# Patient Record
Sex: Male | Born: 1960
Health system: Southern US, Community
[De-identification: ages and names within clinical notes are randomized; demographics above are authoritative.]

## PROBLEM LIST (undated history)

## (undated) DIAGNOSIS — I10 Essential (primary) hypertension: Secondary | ICD-10-CM

## (undated) DIAGNOSIS — E559 Vitamin D deficiency, unspecified: Secondary | ICD-10-CM

## (undated) DIAGNOSIS — R7303 Prediabetes: Secondary | ICD-10-CM

## (undated) DIAGNOSIS — E785 Hyperlipidemia, unspecified: Secondary | ICD-10-CM

## (undated) HISTORY — DX: Vitamin D deficiency, unspecified: E55.9

## (undated) HISTORY — DX: Hyperlipidemia, unspecified: E78.5

## (undated) HISTORY — DX: Essential (primary) hypertension: I10

## (undated) HISTORY — DX: Prediabetes: R73.03

---

## 2002-11-09 HISTORY — PX: NASAL SEPTUM SURGERY: SHX37

## 2004-04-16 ENCOUNTER — Ambulatory Visit (HOSPITAL_COMMUNITY): Admission: RE | Admit: 2004-04-16 | Discharge: 2004-04-16 | Payer: Self-pay | Admitting: Internal Medicine

## 2008-07-20 ENCOUNTER — Ambulatory Visit (HOSPITAL_COMMUNITY): Admission: RE | Admit: 2008-07-20 | Discharge: 2008-07-20 | Payer: Self-pay | Admitting: Internal Medicine

## 2008-12-10 DIAGNOSIS — E119 Type 2 diabetes mellitus without complications: Secondary | ICD-10-CM

## 2008-12-10 HISTORY — DX: Type 2 diabetes mellitus without complications: E11.9

## 2009-09-11 IMAGING — CR DG CHEST 2V
2 series · 2 of 2 positions shown · non-contrast
Comparison: 04/16/2004

CLINICAL DATA: Hypertension.  Fatigue.

CHEST - 2 VIEW

[view not recorded (1 of 2)]
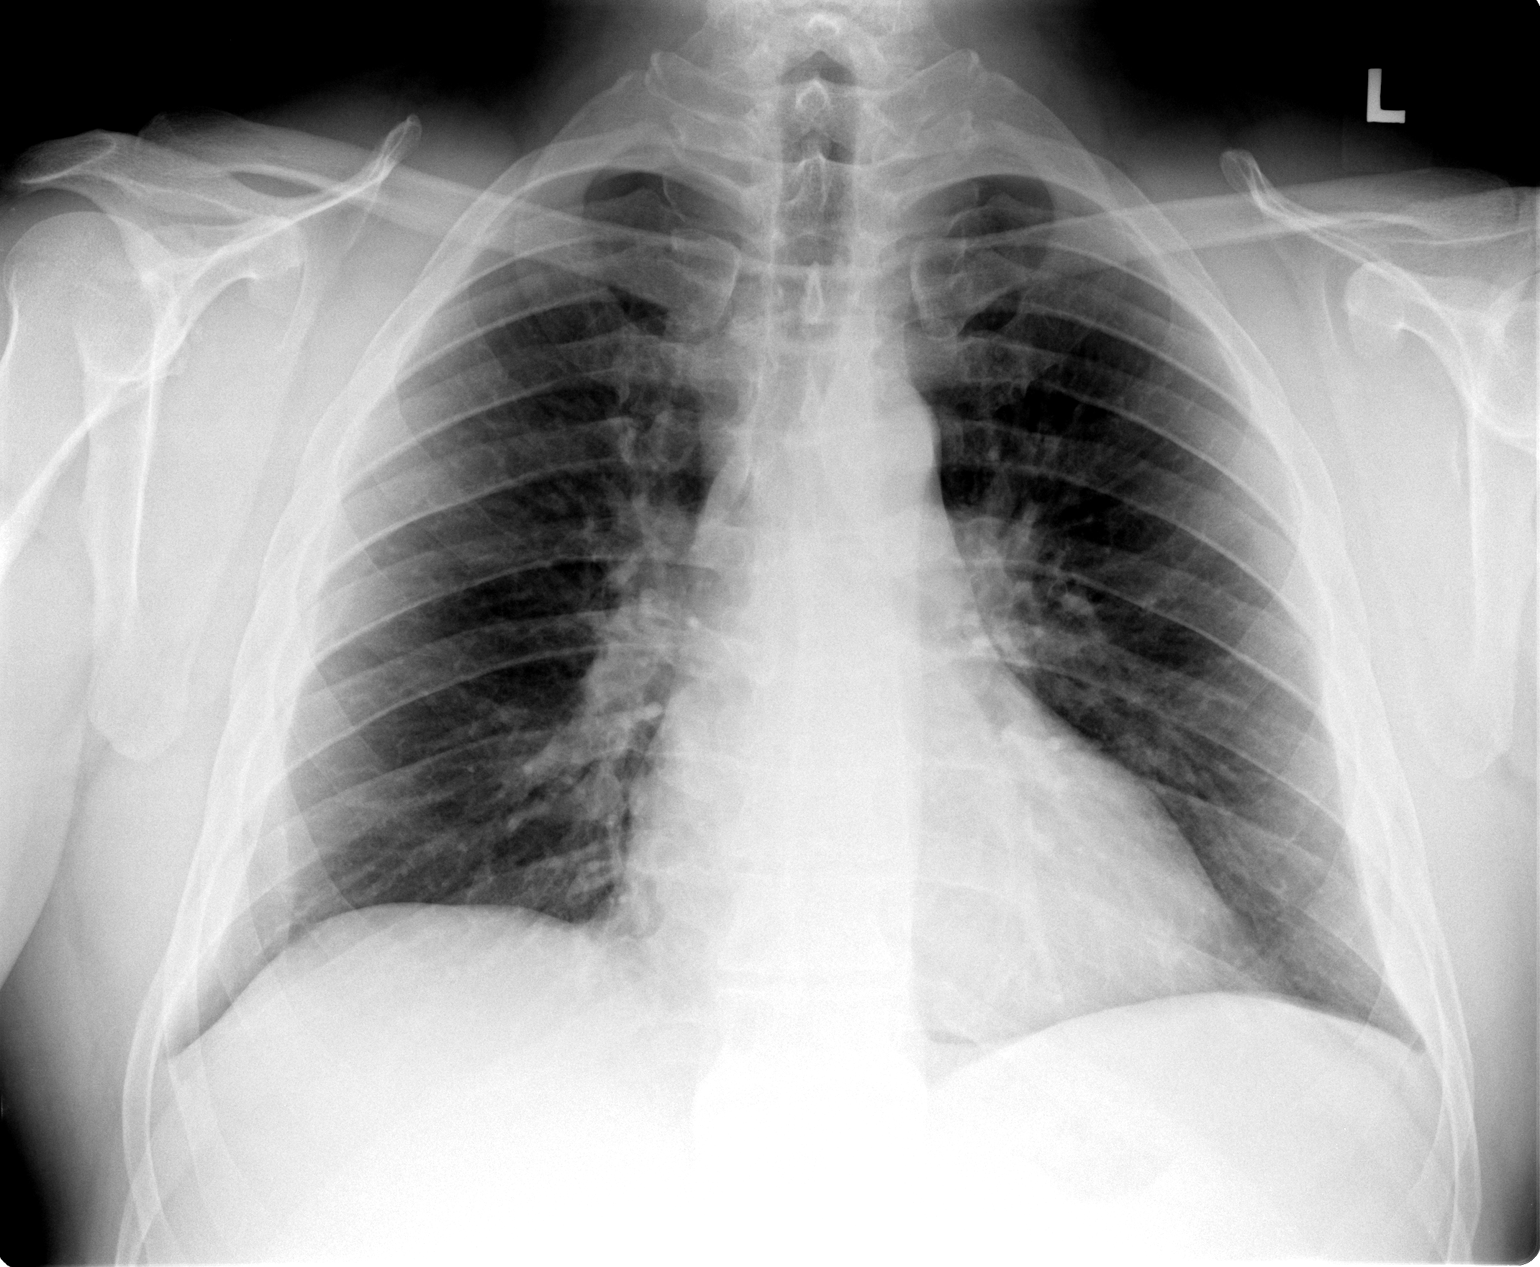

[view not recorded (2 of 2)]
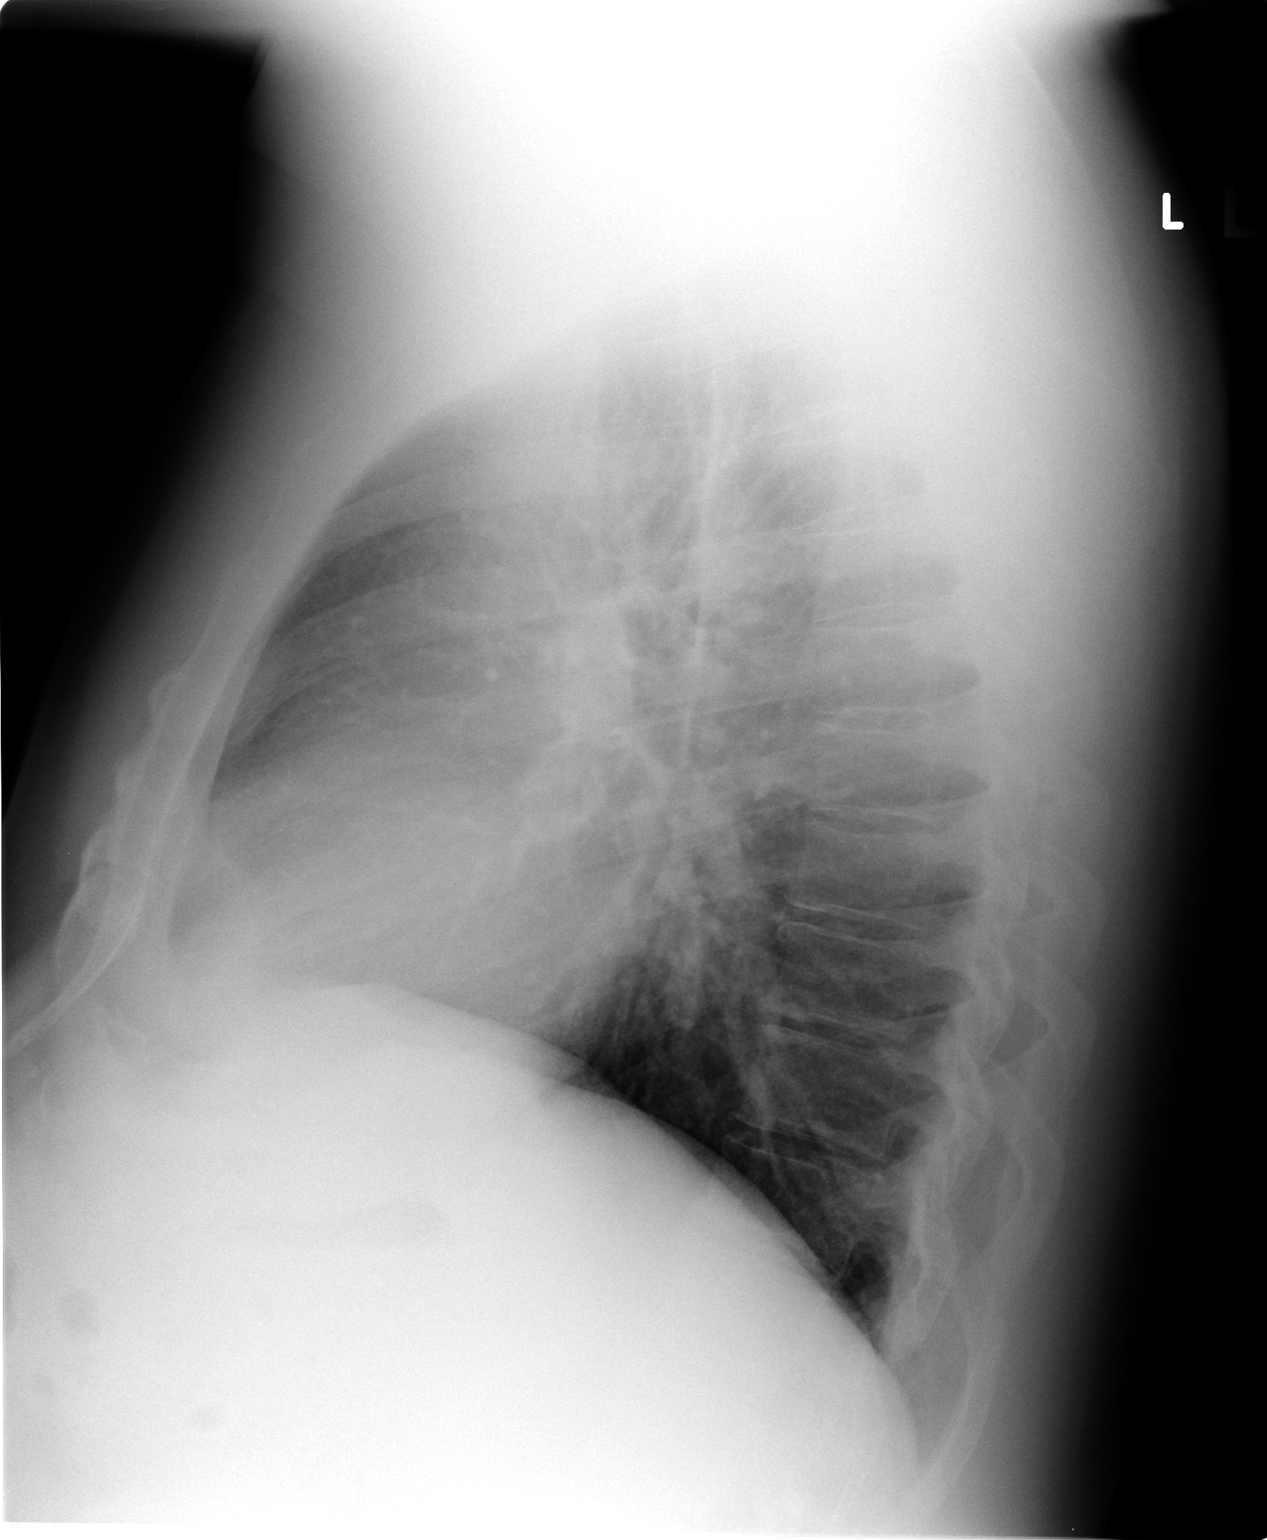

[2 of 2 positions shown; findings below may reference images not displayed]

FINDINGS: Heart and mediastinal contours are within normal limits.
The lung fields appear clear with no evidence for focal infiltrate
or congestive failure.  A slight increase in bilateral pleural
thickening along the lateral thoracic and substernal walls is noted
since the previous exam and likely represents an increase in extra
pleural fat.  No pleural effusions are noted and bony structures
appear intact.
IMPRESSION: No acute cardiopulmonary disease.

## 2011-11-10 HISTORY — PX: HEMORRHOID BANDING: SHX5850

## 2012-03-21 ENCOUNTER — Ambulatory Visit
Admission: RE | Admit: 2012-03-21 | Discharge: 2012-03-21 | Disposition: A | Discharge status: Home / Self Care | Payer: 59 | Source: Ambulatory Visit | Attending: Internal Medicine | Admitting: Internal Medicine

## 2012-03-21 ENCOUNTER — Other Ambulatory Visit: Payer: Self-pay | Admitting: Internal Medicine

## 2012-03-21 DIAGNOSIS — R609 Edema, unspecified: Secondary | ICD-10-CM

## 2012-03-21 DIAGNOSIS — R52 Pain, unspecified: Secondary | ICD-10-CM

## 2012-03-21 DIAGNOSIS — I82402 Acute embolism and thrombosis of unspecified deep veins of left lower extremity: Secondary | ICD-10-CM

## 2013-05-13 IMAGING — US US EXTREM LOW VENOUS*L*
1 series · 14 of 24 positions shown · non-contrast
Comparison: None.

CLINICAL DATA: Left lower extremity pain and swelling, evaluate for
DVT

LEFT LOWER EXTREMITY VENOUS DUPLEX ULTRASOUND
TECHNIQUE: Gray-scale sonography with graded compression, as well
as color Doppler and duplex ultrasound were performed to evaluate
the deep venous system of the lower extremity from the level of the
common femoral vein through the popliteal and proximal calf veins.
Spectral Doppler was utilized to evaluate flow at rest and with
distal augmentation maneuvers.

[Series 1: us extrem low venous*left* · 14 of 28 slices shown]
[im 1/28]
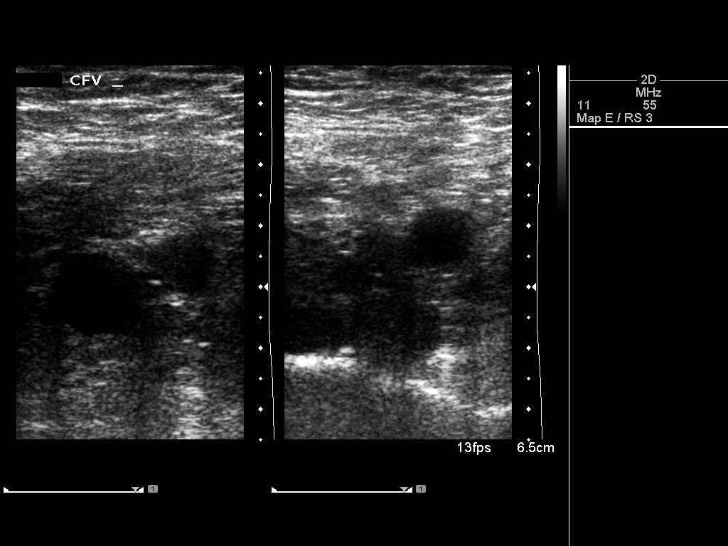
[im 3/28]
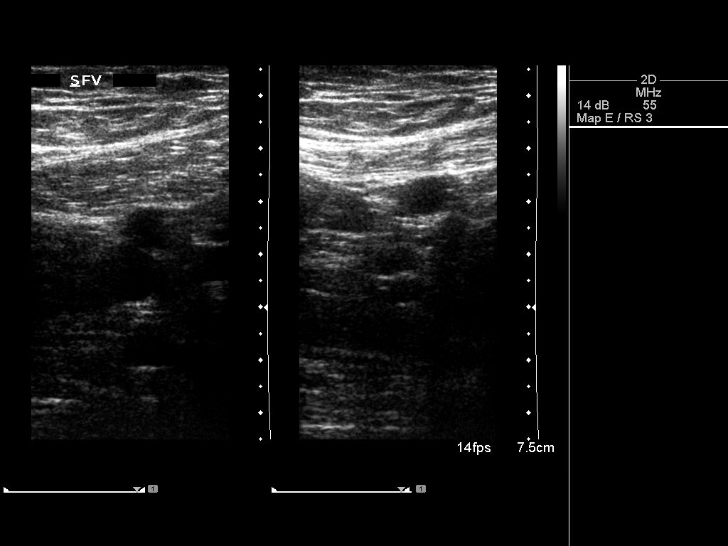
[im 5/28]
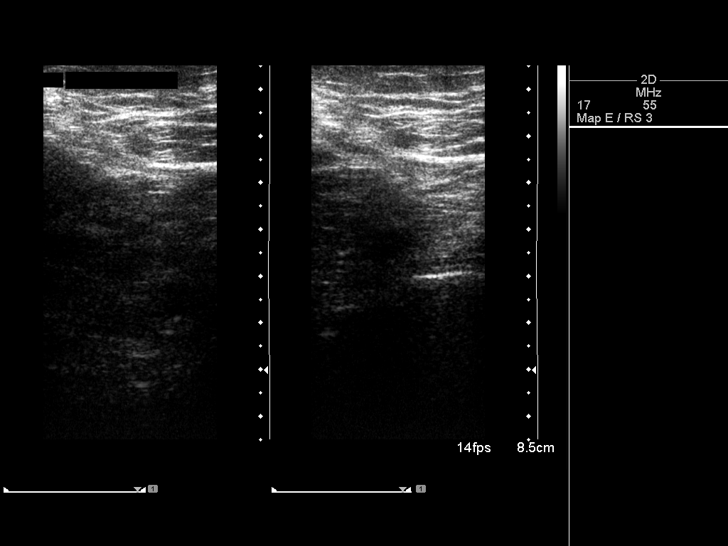
[im 8/28]
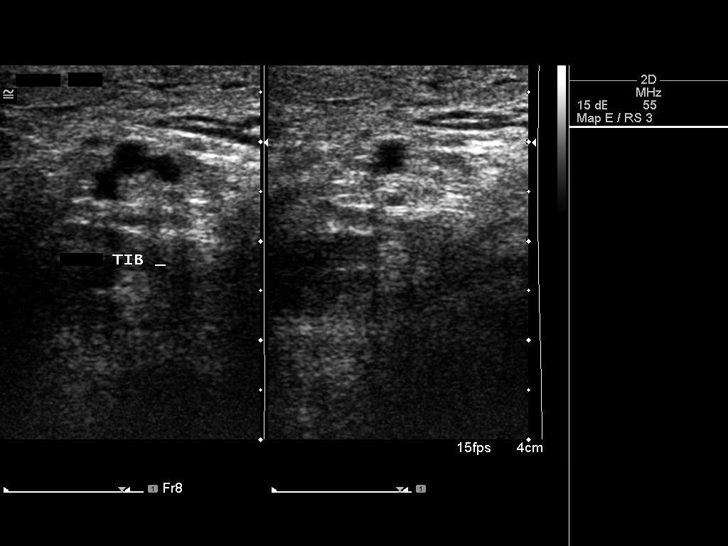
[im 9/28]
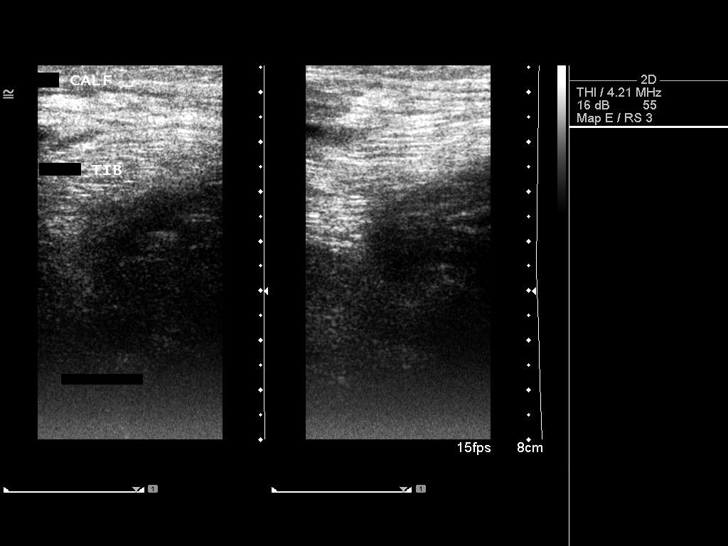
[im 11/28]
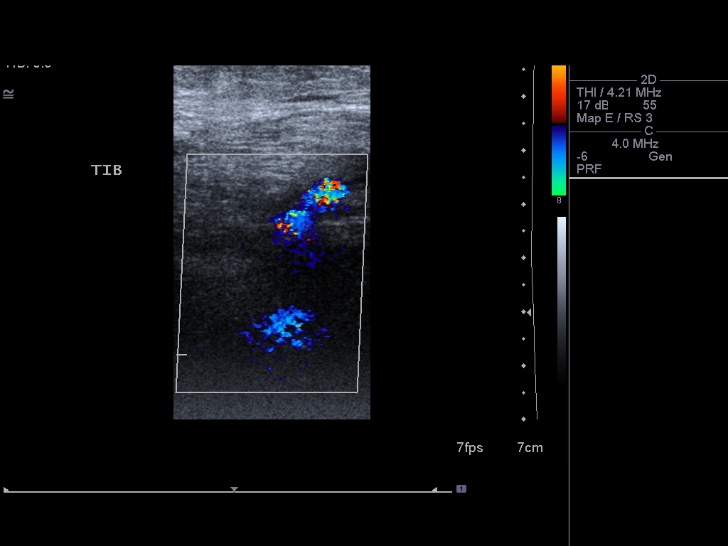
[im 13/28]
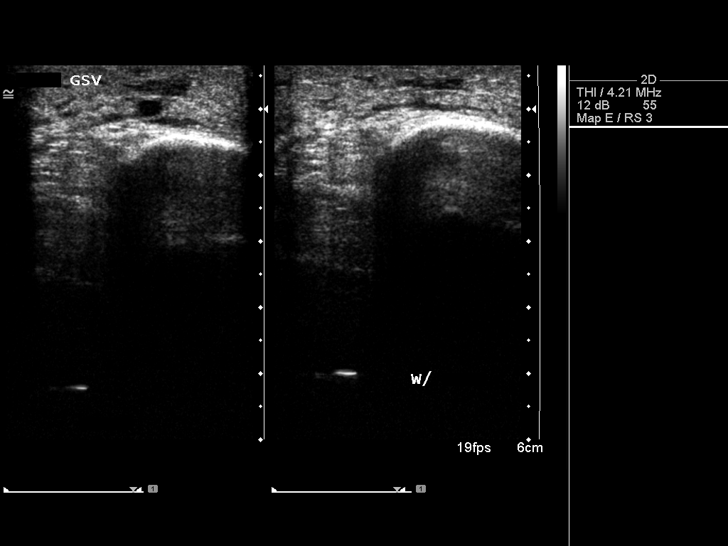
[im 15/28]
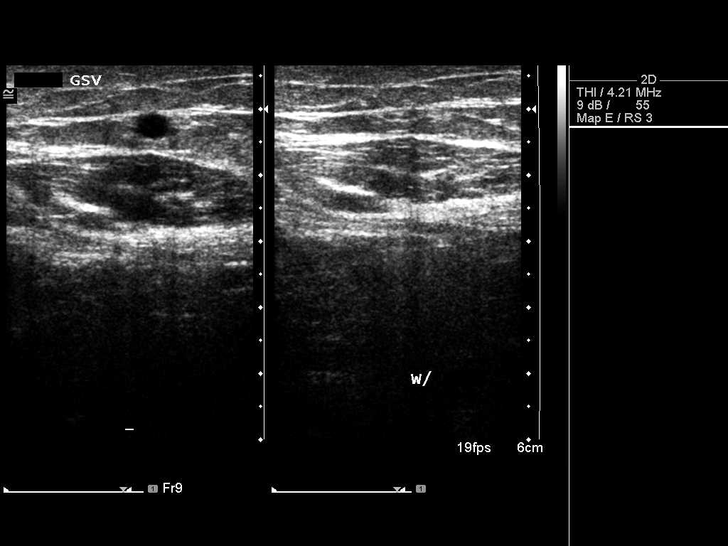
[im 17/28]
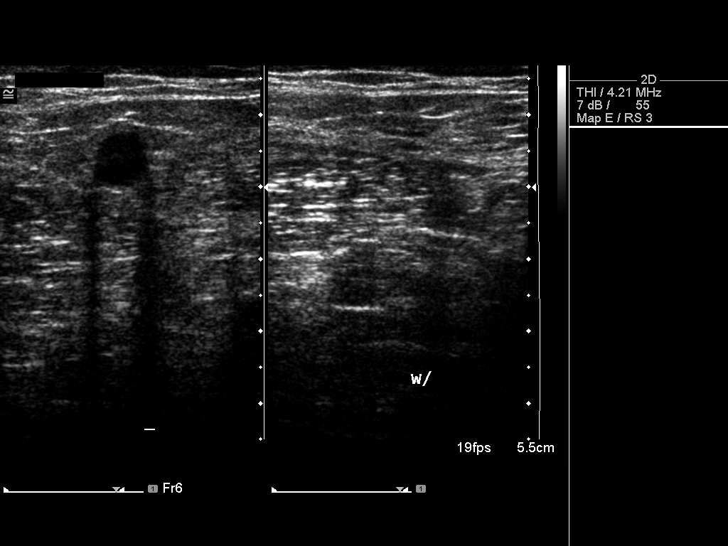
[im 19/28]
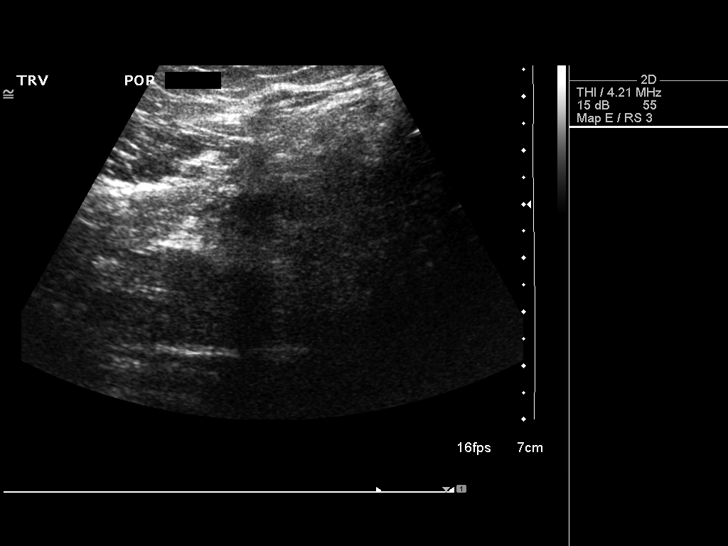
[im 22/28]
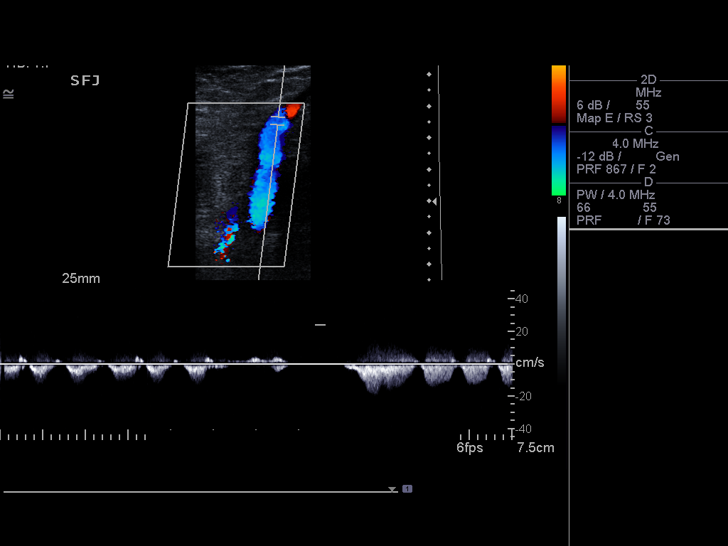
[im 23/28]
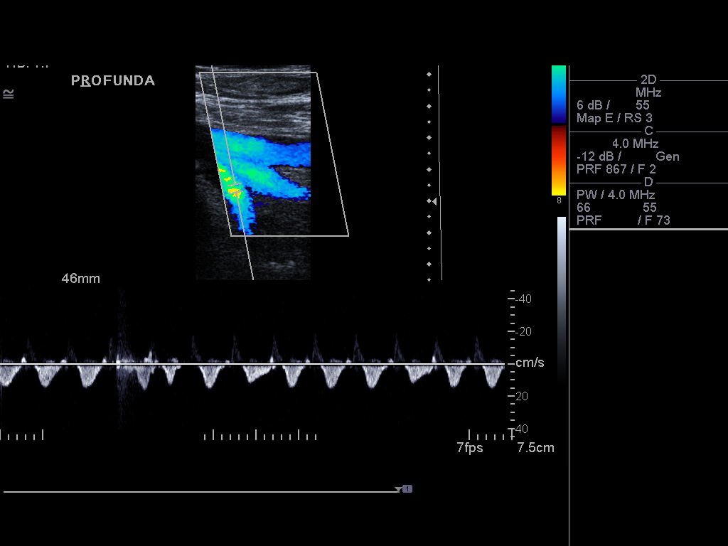
[im 25/28]
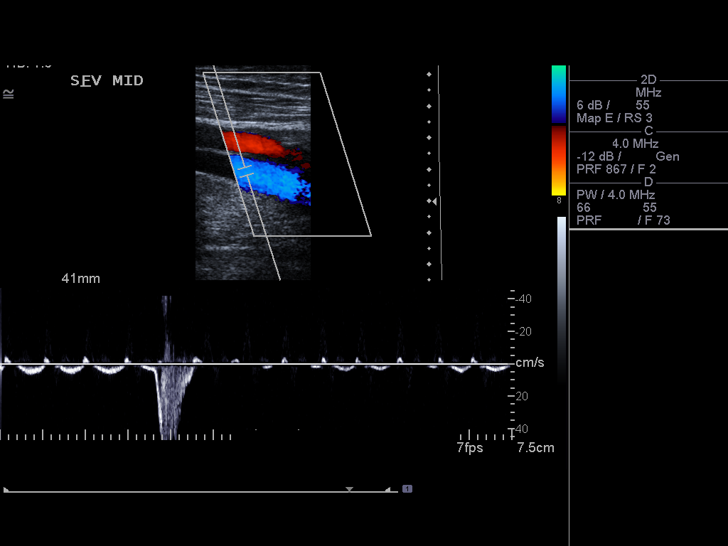
[im 28/28]
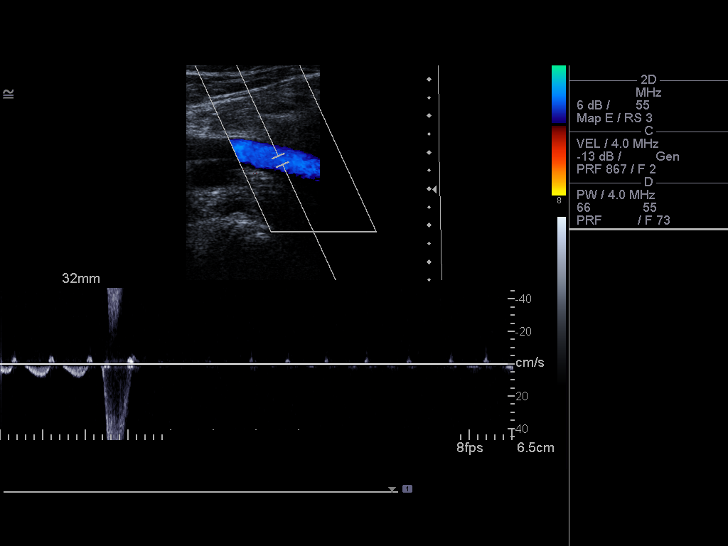

[14 of 24 positions shown; findings below may reference images not displayed]

FINDINGS: Normal compressibility of the common femoral,
superficial femoral, and popliteal veins is demonstrated, as well
as the visualized proximal calf veins.  No filling defects to
suggest DVT on grayscale or color Doppler imaging.  Doppler
waveforms show normal direction of venous flow, normal respiratory
phasicity and response to augmentation.

Minimal amount of subcutaneous edema noted within the lower leg.
IMPRESSION: No evidence of deep vein thrombosis within the left lower
extremity.

## 2014-01-02 ENCOUNTER — Encounter: Payer: Self-pay | Admitting: Physician Assistant

## 2014-04-11 ENCOUNTER — Encounter: Payer: Self-pay | Admitting: Physician Assistant

## 2014-06-26 ENCOUNTER — Ambulatory Visit (INDEPENDENT_AMBULATORY_CARE_PROVIDER_SITE_OTHER): Payer: 59 | Admitting: Internal Medicine

## 2014-06-26 ENCOUNTER — Encounter: Payer: Self-pay | Admitting: Internal Medicine

## 2014-06-26 VITALS — BP 152/90 | HR 64 | Temp 97.9°F | Resp 16 | Ht 70.0 in | Wt 266.8 lb

## 2014-06-26 DIAGNOSIS — E782 Mixed hyperlipidemia: Secondary | ICD-10-CM | POA: Insufficient documentation

## 2014-06-26 DIAGNOSIS — R7309 Other abnormal glucose: Secondary | ICD-10-CM

## 2014-06-26 DIAGNOSIS — Z79899 Other long term (current) drug therapy: Secondary | ICD-10-CM

## 2014-06-26 DIAGNOSIS — E559 Vitamin D deficiency, unspecified: Secondary | ICD-10-CM

## 2014-06-26 DIAGNOSIS — I10 Essential (primary) hypertension: Secondary | ICD-10-CM

## 2014-06-26 DIAGNOSIS — R7303 Prediabetes: Secondary | ICD-10-CM

## 2014-06-26 LAB — CBC WITH DIFFERENTIAL/PLATELET
Basophils Absolute: 0 10*3/uL (ref 0.0–0.1)
Basophils Relative: 0 % (ref 0–1)
Eosinophils Absolute: 0.1 10*3/uL (ref 0.0–0.7)
Eosinophils Relative: 2 % (ref 0–5)
HEMATOCRIT: 43.7 % (ref 39.0–52.0)
Hemoglobin: 15.2 g/dL (ref 13.0–17.0)
LYMPHS ABS: 2.3 10*3/uL (ref 0.7–4.0)
Lymphocytes Relative: 36 % (ref 12–46)
MCH: 28.1 pg (ref 26.0–34.0)
MCHC: 34.8 g/dL (ref 30.0–36.0)
MCV: 80.9 fL (ref 78.0–100.0)
Monocytes Absolute: 0.6 10*3/uL (ref 0.1–1.0)
Monocytes Relative: 10 % (ref 3–12)
NEUTROS ABS: 3.3 10*3/uL (ref 1.7–7.7)
Neutrophils Relative %: 52 % (ref 43–77)
Platelets: 235 10*3/uL (ref 150–400)
RBC: 5.4 MIL/uL (ref 4.22–5.81)
RDW: 14.1 % (ref 11.5–15.5)
WBC: 6.3 10*3/uL (ref 4.0–10.5)

## 2014-06-26 LAB — HEMOGLOBIN A1C
HEMOGLOBIN A1C: 6 % — AB (ref <5.7)
MEAN PLASMA GLUCOSE: 126 mg/dL — AB (ref <117)

## 2014-06-26 MED ORDER — BISOPROLOL-HYDROCHLOROTHIAZIDE 5-6.25 MG PO TABS: 1.0000 | ORAL_TABLET | Freq: Every day | ORAL | Status: DC

## 2014-06-26 MED ORDER — ATORVASTATIN CALCIUM 80 MG PO TABS: 80.0000 mg | ORAL_TABLET | Freq: Every day | ORAL | Status: DC

## 2014-06-26 NOTE — Patient Instructions (Signed)
Start Atorvastatin 80 mg at 1/2 tablet = 40 mg daily  Hypertension As your heart beats, it forces blood through your arteries. This force is your blood pressure. If the pressure is too high, it is called hypertension (HTN) or high blood pressure. HTN is dangerous because you may have it and not know it. High blood pressure may mean that your heart has to work harder to pump blood. Your arteries may be narrow or stiff. The extra work puts you at risk for heart disease, stroke, and other problems.  Blood pressure consists of two numbers, a higher number over a lower, 110/72, for example. It is stated as "110 over 72." The ideal is below 120 for the top number (systolic) and under 80 for the bottom (diastolic). Write down your blood pressure today. You should pay close attention to your blood pressure if you have certain conditions such as:  Heart failure.  Prior heart attack.  Diabetes  Chronic kidney disease.  Prior stroke.  Multiple risk factors for heart disease. To see if you have HTN, your blood pressure should be measured while you are seated with your arm held at the level of the heart. It should be measured at least twice. A one-time elevated blood pressure reading (especially in the Emergency Department) does not mean that you need treatment. There may be conditions in which the blood pressure is different between your right and left arms. It is important to see your caregiver soon for a recheck. Most people have essential hypertension which means that there is not a specific cause. This type of high blood pressure may be lowered by changing lifestyle factors such as:  Stress.  Smoking.  Lack of exercise.  Excessive weight.  Drug/tobacco/alcohol use.  Eating less salt. Most people do not have symptoms from high blood pressure until it has caused damage to the body. Effective treatment can often prevent, delay or reduce that damage. TREATMENT  When a cause has been identified,  treatment for high blood pressure is directed at the cause. There are a large number of medications to treat HTN. These fall into several categories, and your caregiver will help you select the medicines that are best for you. Medications may have side effects. You should review side effects with your caregiver. If your blood pressure stays high after you have made lifestyle changes or started on medicines,   Your medication(s) may need to be changed.  Other problems may need to be addressed.  Be certain you understand your prescriptions, and know how and when to take your medicine.  Be sure to follow up with your caregiver within the time frame advised (usually within two weeks) to have your blood pressure rechecked and to review your medications.  If you are taking more than one medicine to lower your blood pressure, make sure you know how and at what times they should be taken. Taking two medicines at the same time can result in blood pressure that is too low. SEEK IMMEDIATE MEDICAL CARE IF:  You develop a severe headache, blurred or changing vision, or confusion.  You have unusual weakness or numbness, or a faint feeling.  You have severe chest or abdominal pain, vomiting, or breathing problems. MAKE SURE YOU:   Understand these instructions.  Will watch your condition.  Will get help right away if you are not doing well or get worse.   Diabetes and Exercise Exercising regularly is important. It is not just about losing weight. It has many  health benefits, such as:  Improving your overall fitness, flexibility, and endurance.  Increasing your bone density.  Helping with weight control.  Decreasing your body fat.  Increasing your muscle strength.  Reducing stress and tension.  Improving your overall health. People with diabetes who exercise gain additional benefits because exercise:  Reduces appetite.  Improves the body's use of blood sugar (glucose).  Helps lower  or control blood glucose.  Decreases blood pressure.  Helps control blood lipids (such as cholesterol and triglycerides).  Improves the body's use of the hormone insulin by:  Increasing the body's insulin sensitivity.  Reducing the body's insulin needs.  Decreases the risk for heart disease because exercising:  Lowers cholesterol and triglycerides levels.  Increases the levels of good cholesterol (such as high-density lipoproteins [HDL]) in the body.  Lowers blood glucose levels. YOUR ACTIVITY PLAN  Choose an activity that you enjoy and set realistic goals. Your health care provider or diabetes educator can help you make an activity plan that works for you. You can break activities into 2 or 3 sessions throughout the day. Doing so is as good as one long session. Exercise ideas include:  Taking the dog for a walk.  Taking the stairs instead of the elevator.  Dancing to your favorite song.  Doing your favorite exercise with a friend. RECOMMENDATIONS FOR EXERCISING WITH TYPE 1 OR TYPE 2 DIABETES   Check your blood glucose before exercising. If blood glucose levels are greater than 240 mg/dL, check for urine ketones. Do not exercise if ketones are present.  Avoid injecting insulin into areas of the body that are going to be exercised. For example, avoid injecting insulin into:  The arms when playing tennis.  The legs when jogging.  Keep a record of:  Food intake before and after you exercise.  Expected peak times of insulin action.  Blood glucose levels before and after you exercise.  The type and amount of exercise you have done.  Review your records with your health care provider. Your health care provider will help you to develop guidelines for adjusting food intake and insulin amounts before and after exercising.  If you take insulin or oral hypoglycemic agents, watch for signs and symptoms of hypoglycemia. They  include:  Dizziness.  Shaking.  Sweating.  Chills.  Confusion.  Drink plenty of water while you exercise to prevent dehydration or heat stroke. Body water is lost during exercise and must be replaced.  Talk to your health care provider before starting an exercise program to make sure it is safe for you. Remember, almost any type of activity is better than none.    Cholesterol Cholesterol is a white, waxy, fat-like protein needed by your body in small amounts. The liver makes all the cholesterol you need. It is carried from the liver by the blood through the blood vessels. Deposits (plaque) may build up on blood vessel walls. This makes the arteries narrower and stiffer. Plaque increases the risk for heart attack and stroke. You cannot feel your cholesterol level even if it is very high. The only way to know is by a blood test to check your lipid (fats) levels. Once you know your cholesterol levels, you should keep a record of the test results. Work with your caregiver to to keep your levels in the desired range. WHAT THE RESULTS MEAN:  Total cholesterol is a rough measure of all the cholesterol in your blood.  LDL is the so-called bad cholesterol. This is the type that  deposits cholesterol in the walls of the arteries. You want this level to be low.  HDL is the good cholesterol because it cleans the arteries and carries the LDL away. You want this level to be high.  Triglycerides are fat that the body can either burn for energy or store. High levels are closely linked to heart disease. DESIRED LEVELS:  Total cholesterol below 200.  LDL below 100 for people at risk, below 70 for very high risk.  HDL above 50 is good, above 60 is best.  Triglycerides below 150. HOW TO LOWER YOUR CHOLESTEROL:  Diet.  Choose fish or white meat chicken and [place], roasted or baked. Limit fatty cuts of red meat, fried foods, and processed meats, such as sausage and lunch meat.  Eat lots of  fresh fruits and vegetables. Choose whole grains, beans, pasta, potatoes and cereals.  Use only small amounts of olive, corn or canola oils. Avoid butter, mayonnaise, shortening or palm kernel oils. Avoid foods with trans-fats.  Use skim/nonfat milk and low-fat/nonfat yogurt and cheeses. Avoid whole milk, cream, ice cream, egg yolks and cheeses. Healthy desserts include angel food cake, ginger snaps, animal crackers, hard candy, popsicles, and low-fat/nonfat frozen yogurt. Avoid pastries, cakes, pies and cookies.  Exercise.  A regular program helps decrease LDL and raises HDL.  Helps with weight control.  Do things that increase your activity level like gardening, walking, or taking the stairs.  Medication.  May be prescribed by your caregiver to help lowering cholesterol and the risk for heart disease.  You may need medicine even if your levels are normal if you have several risk factors. HOME CARE INSTRUCTIONS   Follow your diet and exercise programs as suggested by your caregiver.  Take medications as directed.  Have blood work done when your caregiver feels it is necessary. MAKE SURE YOU:   Understand these instructions.  Will watch your condition.  Will get help right away if you are not doing well or get worse.      Vitamin D Deficiency Vitamin D is an important vitamin that your body needs. Having too little of it in your body is called a deficiency. A very bad deficiency can make your bones soft and can cause a condition called rickets.  Vitamin D is important to your body for different reasons, such as:   It helps your body absorb 2 minerals called calcium and phosphorus.  It helps make your bones healthy.  It may prevent some diseases, such as diabetes and multiple sclerosis.  It helps your muscles and heart. You can get vitamin D in several ways. It is a natural part of some foods. The vitamin is also added to some dairy products and cereals. Some people  take vitamin D supplements. Also, your body makes vitamin D when you are in the sun. It changes the sun's rays into a form of the vitamin that your body can use. CAUSES   Not eating enough foods that contain vitamin D.  Not getting enough sunlight.  Having certain digestive system diseases that make it hard to absorb vitamin D. These diseases include Crohn's disease, chronic pancreatitis, and cystic fibrosis.  Having a surgery in which part of the stomach or small intestine is removed.  Being obese. Fat cells pull vitamin D out of your blood. That means that obese people may not have enough vitamin D left in their blood and in other body tissues.  Having chronic kidney or liver disease. RISK FACTORS Risk factors  are things that make you more likely to develop a vitamin D deficiency. They include:  Being older.  Not being able to get outside very much.  Living in a nursing home.  Having had broken bones.  Having weak or thin bones (osteoporosis).  Having a disease or condition that changes how your body absorbs vitamin D.  Having dark skin.  Some medicines such as seizure medicines or steroids.  Being overweight or obese. SYMPTOMS Mild cases of vitamin D deficiency may not have any symptoms. If you have a very bad case, symptoms may include:  Bone pain.  Muscle pain.  Falling often.  Broken bones caused by a minor injury, due to osteoporosis. DIAGNOSIS A blood test is the best way to tell if you have a vitamin D deficiency. TREATMENT Vitamin D deficiency can be treated in different ways. Treatment for vitamin D deficiency depends on what is causing it. Options include:  Taking vitamin D supplements.  Taking a calcium supplement. Your caregiver will suggest what dose is best for you. HOME CARE INSTRUCTIONS  Take any supplements that your caregiver prescribes. Follow the directions carefully. Take only the suggested amount.  Have your blood tested 2 months after  you start taking supplements.  Eat foods that contain vitamin D. Healthy choices include:  Fortified dairy products, cereals, or juices. Fortified means vitamin D has been added to the food. Check the label on the package to be sure.  Fatty fish like salmon or trout.  Eggs.  Oysters.  Do not use a tanning bed.  Keep your weight at a healthy level. Lose weight if you need to.  Keep all follow-up appointments. Your caregiver will need to perform blood tests to make sure your vitamin D deficiency is going away. SEEK MEDICAL CARE IF:  You have any questions about your treatment.  You continue to have symptoms of vitamin D deficiency.  You have nausea or vomiting.  You are constipated.  You feel confused.  You have severe abdominal or back pain. MAKE SURE YOU:  Understand these instructions.  Will watch your condition.  Will get help right away if you are not doing well or get worse.

## 2014-06-26 NOTE — Progress Notes (Signed)
Patient ID: Larry Hudson, male   DOB: 16-Feb-1961, 53 y.o.   MRN: [id number]   This very nice 53 y.o.M [place] Male whp  presents for follow up with Hypertension, Hyperlipidemia, Pre-Diabetes and Vitamin D Deficiency.    Patient has been off Treatment and lost to f/u after he lost his job last year. Today's BP: 152/90 mmHg. Patient denies any cardiac type chest pain, palpitations, dyspnea/orthopnea/PND, dizziness, claudication, or dependent edema.   Hyperlipidemia is not controlled with diet with last labs in Feb 2014 showing Elevated lipids with Chol 309, Trig 175, HDL 31 And severely elevated LDL of 243.   Also, the patient has history of PreDiabetes and and insulin resistance with an A1c of 6.1% and insulin level 39 in Feb 2014. Patient denies any symptoms of reactive hypoglycemia, diabetic polys, paresthesias or visual blurring.  Last A1c was     Further, Patient has history of Vitamin D Deficiency and patient supplements vitamin D without any suspected side-effects. Last vitamin D was  63 in Feb 2014.  Current meds/supplements include BComplex, bASA 81 mg, Fish Oil 1200 mg, and Vit D 5,000 u/day.  No Known Allergies  PMHx:  Pertinent as above for Labile HTN, Hyperlipidemia, PreDiabetes and Vit D Deficiency. FHx:    Reviewed / unchanged SHx:    Reviewed / unchanged  Systems Review:  Constitutional: Denies fever, chills, wt changes, headaches, insomnia, fatigue, night sweats, change in appetite. Eyes: Denies redness, blurred vision, diplopia, discharge, itchy, watery eyes.  ENT: Denies discharge, congestion, post nasal drip, epistaxis, sore throat, earache, hearing loss, dental pain, tinnitus, vertigo, sinus pain, snoring.  CV: Denies chest pain, palpitations, irregular heartbeat, syncope, dyspnea, diaphoresis, orthopnea, PND, claudication or edema. Respiratory: denies cough, dyspnea, DOE, pleurisy, hoarseness, laryngitis, wheezing.  Gastrointestinal: Denies dysphagia, odynophagia,  heartburn, reflux, water brash, abdominal pain or cramps, nausea, vomiting, bloating, diarrhea, constipation, hematemesis, melena, hematochezia  or hemorrhoids. Genitourinary: Denies dysuria, frequency, urgency, nocturia, hesitancy, discharge, hematuria or flank pain. Musculoskeletal: Denies arthralgias, myalgias, stiffness, jt. swelling, pain, limping or strain/sprain.  Skin: Denies pruritus, rash, hives, warts, acne, eczema or change in skin lesion(s). Neuro: No weakness, tremor, incoordination, spasms, paresthesia or pain. Psychiatric: Denies confusion, memory loss or sensory loss. Endo: Denies change in weight, skin or hair change.  Heme/Lymph: No excessive bleeding, bruising or enlarged lymph nodes.  Exam:  BP 152/90  Pulse 64  Temp(Src) 97.9 F (36.6 C)  Resp 16  Wt 266 lb 12.8 oz (121.02 kg)  Appears well nourished and in no distress. Eyes: PERRLA, EOMs, conjunctiva no swelling or erythema. Sinuses: No frontal/maxillary tenderness ENT/Mouth: EAC's clear, TM's nl w/o erythema, bulging. Nares clear w/o erythema, swelling, exudates. Oropharynx clear without erythema or exudates. Oral hygiene is good. Tongue normal, non obstructing. Hearing intact.  Neck: Supple. Thyroid nl. Car 2+/2+ without bruits, nodes or JVD. Chest: Respirations nl with BS clear & equal w/o rales, rhonchi, wheezing or stridor.  Cor: Heart sounds normal w/ regular rate and rhythm without sig. murmurs, gallops, clicks, or rubs. Peripheral pulses normal and equal  without edema.  Abdomen: Soft & bowel sounds normal. Non-tender w/o guarding, rebound, hernias, masses, or organomegaly.  Lymphatics: Unremarkable.  Musculoskeletal: Full ROM all peripheral extremities, joint stability, 5/5 strength, and normal gait.  Skin: Warm, dry without exposed rashes, lesions or ecchymosis apparent.  Neuro: Cranial nerves intact, reflexes equal bilaterally. Sensory-motor testing grossly intact. Tendon reflexes grossly intact.   Pysch: Alert & oriented x 3. Insight and judgement nl & appropriate. No  ideations.  Assessment and Plan:  1. Hypertension - Continue monitor blood pressure at home. Restart Ziac 5 qd.  2. Hyperlipidemia - Continue diet, exercise,& lifestyle modifications and Rx Atorvastatin 80 mg & begin 1/2 = 40 mg qd. Continue monitor periodic cholesterol/liver & renal functions   3. Pre-Diabetes - Continue diet, exercise, lifestyle modifications. Monitor appropriate labs.    4. Vitamin D Deficiency - Continue supplementation.   Recommended regular exercise, BP monitoring, weight control, and discussed med and SE's. Recommended labs to assess and monitor clinical status. Further disposition pending results of labs.  ROV 1 mo . Discussed meds & SE's.

## 2014-06-27 LAB — LIPID PANEL
CHOLESTEROL: 272 mg/dL — AB (ref 0–200)
HDL: 27 mg/dL — ABNORMAL LOW (ref >39)
LDL CALC: 166 mg/dL — AB (ref 0–99)
Total CHOL/HDL Ratio: 10.1 Ratio
Triglycerides: 393 mg/dL — ABNORMAL HIGH (ref <150)
VLDL: 79 mg/dL — ABNORMAL HIGH (ref 0–40)

## 2014-06-27 LAB — BASIC METABOLIC PANEL WITH GFR
BUN: 10 mg/dL (ref 6–23)
CALCIUM: 9.5 mg/dL (ref 8.4–10.5)
CHLORIDE: 101 meq/L (ref 96–112)
CO2: 24 mEq/L (ref 19–32)
CREATININE: 0.98 mg/dL (ref 0.50–1.35)
GFR, Est Non African American: 88 mL/min
GLUCOSE: 81 mg/dL (ref 70–99)
POTASSIUM: 4.5 meq/L (ref 3.5–5.3)
SODIUM: 136 meq/L (ref 135–145)

## 2014-06-27 LAB — TSH: TSH: 1.973 u[IU]/mL (ref 0.350–4.500)

## 2014-06-27 LAB — HEPATIC FUNCTION PANEL
ALBUMIN: 4.4 g/dL (ref 3.5–5.2)
ALT: 25 U/L (ref 0–53)
AST: 22 U/L (ref 0–37)
Alkaline Phosphatase: 54 U/L (ref 39–117)
BILIRUBIN INDIRECT: 1 mg/dL (ref 0.2–1.2)
Bilirubin, Direct: 0.1 mg/dL (ref 0.0–0.3)
Total Bilirubin: 1.1 mg/dL (ref 0.2–1.2)
Total Protein: 7.3 g/dL (ref 6.0–8.3)

## 2014-06-27 LAB — INSULIN, FASTING: INSULIN FASTING, SERUM: 20 u[IU]/mL (ref 3–28)

## 2014-06-27 LAB — MAGNESIUM: MAGNESIUM: 1.8 mg/dL (ref 1.5–2.5)

## 2014-06-27 LAB — VITAMIN D 25 HYDROXY (VIT D DEFICIENCY, FRACTURES): Vit D, 25-Hydroxy: 78 ng/mL (ref 30–89)

## 2014-08-10 ENCOUNTER — Ambulatory Visit (INDEPENDENT_AMBULATORY_CARE_PROVIDER_SITE_OTHER): Payer: 59 | Admitting: Internal Medicine

## 2014-08-10 ENCOUNTER — Encounter: Payer: Self-pay | Admitting: Internal Medicine

## 2014-08-10 VITALS — BP 140/86 | HR 52 | Temp 98.1°F | Resp 16 | Ht 70.0 in | Wt 269.6 lb

## 2014-08-10 DIAGNOSIS — R7303 Prediabetes: Secondary | ICD-10-CM

## 2014-08-10 DIAGNOSIS — R7309 Other abnormal glucose: Secondary | ICD-10-CM

## 2014-08-10 DIAGNOSIS — Z23 Encounter for immunization: Secondary | ICD-10-CM

## 2014-08-10 DIAGNOSIS — E782 Mixed hyperlipidemia: Secondary | ICD-10-CM

## 2014-08-10 DIAGNOSIS — E559 Vitamin D deficiency, unspecified: Secondary | ICD-10-CM

## 2014-08-10 DIAGNOSIS — I10 Essential (primary) hypertension: Secondary | ICD-10-CM

## 2014-08-10 DIAGNOSIS — Z79899 Other long term (current) drug therapy: Secondary | ICD-10-CM

## 2014-08-10 NOTE — Patient Instructions (Signed)

## 2014-08-10 NOTE — Progress Notes (Signed)
Patient ID: Larry Hudson, male   DOB: 10/23/1961, 53 y.o.   MRN: [id number]   This very nice 53 y.o.MWM presents for 3 month follow up with Hypertension, Hyperlipidemia, Pre-Diabetes and Vitamin D Deficiency.    Patient is treated for HTN & BP has been controlled at home. Today's BP: 140/86 mmHg. Patient has had no complaints of any cardiac type chest pain, palpitations, dyspnea/orthopnea/PND, dizziness, claudication, or dependent edema.   Hyperlipidemia is not controlled with diet & patient was recently started on Atorvastatin.  Patient denies myalgias or other med SE's. Last Lipids were Total Chol  272*; HDL  27*; LDL 166*; Trig 393 on 06/26/2014.   Also, the patient has history of Morbid Obesity (BMI 38.68) and consequent PreDiabetes and denies symptoms of reactive hypoglycemia, diabetic polys, paresthesias or visual blurring.  Last A1c was  6.0% on 06/26/2014.    Further, the patient also has history of Vitamin D Deficiency and supplements vitamin D without any suspected side-effects. Last vitamin D was  78 on 06/26/2014.   Medication List   atorvastatin 80 MG tablet  Commonly known as:  LIPITOR  Take 1 tablet (80 mg total) by mouth daily. For cholesterol     B COMPLEX PO  Take by mouth daily.     BABY ASPIRIN PO  Take 81 mg by mouth daily.     bisoprolol-hydrochlorothiazide 5-6.25 MG per tablet  Commonly known as:  ZIAC  Take 1 tablet by mouth daily. For BP     FISH OIL PO  Take 1,200 mg by mouth daily.     VITAMIN D PO  Take 5,000 Int'l Units by mouth daily.     No Known Allergies  PMHx:   Past Medical History  Diagnosis Date  . Hyperlipidemia   . Hypertension   . Vitamin D deficiency   . Prediabetes    FHx:    Reviewed / unchanged  SHx:    Reviewed / unchanged   Systems Review:  Constitutional: Denies fever, chills, wt changes, headaches, insomnia, fatigue, night sweats, change in appetite. Eyes: Denies redness, blurred vision, diplopia, discharge, itchy, watery  eyes.  ENT: Denies discharge, congestion, post nasal drip, epistaxis, sore throat, earache, hearing loss, dental pain, tinnitus, vertigo, sinus pain, snoring.  CV: Denies chest pain, palpitations, irregular heartbeat, syncope, dyspnea, diaphoresis, orthopnea, PND, claudication or edema. Respiratory: denies cough, dyspnea, DOE, pleurisy, hoarseness, laryngitis, wheezing.  Gastrointestinal: Denies dysphagia, odynophagia, heartburn, reflux, water brash, abdominal pain or cramps, nausea, vomiting, bloating, diarrhea, constipation, hematemesis, melena, hematochezia  or hemorrhoids. Genitourinary: Denies dysuria, frequency, urgency, nocturia, hesitancy, discharge, hematuria or flank pain. Musculoskeletal: Denies arthralgias, myalgias, stiffness, jt. swelling, pain, limping or strain/sprain.  Skin: Denies pruritus, rash, hives, warts, acne, eczema or change in skin lesion(s). Neuro: No weakness, tremor, incoordination, spasms, paresthesia or pain. Psychiatric: Denies confusion, memory loss or sensory loss. Endo: Denies change in weight, skin or hair change.  Heme/Lymph: No excessive bleeding, bruising or enlarged lymph nodes.  Exam:  BP 140/86  Pulse 52  Temp 98.1 F   Resp 16  Ht 5\' 10"    Wt 269 lb 9.6 oz   BMI 38.68   Appears well nourished and in no distress. Eyes: PERRLA, EOMs, conjunctiva no swelling or erythema. Sinuses: No frontal/maxillary tenderness ENT/Mouth: EAC's clear, TM's nl w/o erythema, bulging. Nares clear w/o erythema, swelling, exudates. Oropharynx clear without erythema or exudates. Oral hygiene is good. Tongue normal, non obstructing. Hearing intact.  Neck: Supple. Thyroid nl. Car 2+/2+ without bruits, nodes  or JVD. Chest: Respirations nl with BS clear & equal w/o rales, rhonchi, wheezing or stridor.  Cor: Heart sounds normal w/ regular rate and rhythm without sig. murmurs, gallops, clicks, or rubs. Peripheral pulses normal and equal  without edema.  Abdomen: Soft & bowel  sounds normal. Non-tender w/o guarding, rebound, hernias, masses, or organomegaly.  Lymphatics: Unremarkable.  Musculoskeletal: Full ROM all peripheral extremities, joint stability, 5/5 strength, and normal gait.  Skin: Warm, dry without exposed rashes, lesions or ecchymosis apparent.  Neuro: Cranial nerves intact, reflexes equal bilaterally. Sensory-motor testing grossly intact. Tendon reflexes grossly intact.  Pysch: Alert & oriented x 3.  Insight and judgement nl & appropriate. No ideations.  Assessment and Plan:  1. Hypertension - Continue monitor blood pressure at home. Continue diet/meds same.  2. Hyperlipidemia - Continue diet/meds, exercise,& lifestyle modifications. Continue monitor periodic cholesterol/liver & renal functions   3. Pre-Diabetes - Continue diet, exercise, lifestyle modifications. Monitor appropriate labs.  4. Vitamin D Deficiency - Continue supplementation.   Recommended regular exercise, BP monitoring, weight control, and discussed med and SE's. Recommended labs to assess and monitor clinical status. Further disposition pending results of labs.

## 2014-08-11 LAB — HEPATIC FUNCTION PANEL
ALT: 35 U/L (ref 0–53)
AST: 28 U/L (ref 0–37)
Albumin: 4.2 g/dL (ref 3.5–5.2)
Alkaline Phosphatase: 66 U/L (ref 39–117)
BILIRUBIN TOTAL: 1.2 mg/dL (ref 0.2–1.2)
Bilirubin, Direct: 0.2 mg/dL (ref 0.0–0.3)
Indirect Bilirubin: 1 mg/dL (ref 0.2–1.2)
TOTAL PROTEIN: 7 g/dL (ref 6.0–8.3)

## 2014-08-11 LAB — LIPID PANEL
CHOL/HDL RATIO: 7.4 ratio
Cholesterol: 206 mg/dL — ABNORMAL HIGH (ref 0–200)
HDL: 28 mg/dL — ABNORMAL LOW (ref >39)
LDL Cholesterol: 111 mg/dL — ABNORMAL HIGH (ref 0–99)
Triglycerides: 333 mg/dL — ABNORMAL HIGH (ref <150)
VLDL: 67 mg/dL — AB (ref 0–40)

## 2014-10-04 NOTE — Progress Notes (Signed)
Patient ID: Larry GunnelsRiad Derocher, male   DOB: 1961-08-04, 53 y.o.   MRN: [id number]  Annual Screening Comprehensive Examination  This very nice 53 y.o.M Arabic Male presents for complete physical.  Patient has been followed for HTN, Prediabetes, Hyperlipidemia, and Vitamin D Deficiency.   HTN predates since 2010. Patient's BP has been controlled at home.Today's BP: 104/70 mmHg. Patient denies any cardiac symptoms as chest pain, palpitations, shortness of breath, dizziness or ankle swelling.   Patient's hyperlipidemia is controlled with diet and medications. Patient denies myalgias or other medication SE's. Last lipids were at goal -  Total  Chol 206; HDL  28; LDL  111; Trig  333 on 08/10/2014.   Patient has Morbid Obesity BMI 36.91 and consequent prediabetes with A1c 6.1% and elevated insulin 39 in Feb 2014 and patient denies reactive hypoglycemic symptoms, visual blurring, diabetic polys or paresthesias. Last A1c was 6.0% 06/26/2014.   Finally, patient has history of Vitamin D Deficiency and last vitamin D was  78 on 06/26/2014.   Medication Sig  . atorvastatin (LIPITOR) 80 MG tablet Take 1 tablet (80 mg total) by mouth daily. For cholesterol  . B Complex Vitamins (B COMPLEX PO) Take by mouth daily.  Marland Kitchen. BABY ASPIRIN PO Take 81 mg by mouth daily.  . bisoprolol-hydrochlorothiazide (ZIAC) 5-6.25 MG per tablet Take 1 tablet by mouth daily. For BP  . Cholecalciferol (VITAMIN D PO) Take 5,000 Int'l Units by mouth daily.  . Omega-3 Fatty Acids (FISH OIL PO) Take 1,200 mg by mouth daily.   No Known Allergies  Past Medical History  Diagnosis Date  . Hyperlipidemia   . Hypertension   . Vitamin D deficiency   . Prediabetes    Health Maintenance  Topic Date Due  . INFLUENZA VACCINE  06/09/2014  . TETANUS/TDAP  08/15/2019  . COLONOSCOPY  11/25/2021   Immunization History  Administered Date(s) Administered  . Influenza Split 10/08/2014  . PPD Test 10/08/2014  . Tdap 08/14/2009   Past Surgical History   Procedure Laterality Date  . Hemorrhoid banding  2013  . Nasal septum surgery  2004   Family History  Problem Relation Age of Onset  . Hyperlipidemia Mother   . Heart attack Mother   . Heart disease Father   . Emphysema Father    History   Social History  . Marital Status: Married    Spouse Name: N/A    Number of Children: N/A  . Years of Education: N/A   Occupational History  . Not on file.   Social History Main Topics  . Smoking status: Former Games developermoker  . Smokeless tobacco: Never Used  . Alcohol Use: No  . Drug Use: No  . Sexual Activity: Not on file    ROS Constitutional: Denies fever, chills, weight loss/gain, headaches, insomnia, fatigue, night sweats or change in appetite. Eyes: Denies redness, blurred vision, diplopia, discharge, itchy or watery eyes.  ENT: Denies discharge, congestion, post nasal drip, epistaxis, sore throat, earache, hearing loss, dental pain, Tinnitus, Vertigo, Sinus pain or snoring.  Cardio: Denies chest pain, palpitations, irregular heartbeat, syncope, dyspnea, diaphoresis, orthopnea, PND, claudication or edema Respiratory: denies cough, dyspnea, DOE, pleurisy, hoarseness, laryngitis or wheezing.  Gastrointestinal: Denies dysphagia, heartburn, reflux, water brash, pain, cramps, nausea, vomiting, bloating, diarrhea, constipation, hematemesis, melena, hematochezia, jaundice or hemorrhoids Genitourinary: Denies dysuria, frequency, urgency, nocturia, hesitancy, discharge, hematuria or flank pain Musculoskeletal: Denies arthralgia, myalgia, stiffness, Jt. Swelling, pain, limp or strain/sprain. Denies Falls. Skin: Denies puritis, rash, hives, warts, acne, eczema or  change in skin lesion Neuro: No weakness, tremor, incoordination, spasms, paresthesia or pain Psychiatric: Denies confusion, memory loss or sensory loss. Denies Depression. Endocrine: Denies change in weight, skin, hair change, nocturia, and paresthesia, diabetic polys, visual blurring or  hyper / hypo glycemic episodes.  Heme/Lymph: No excessive bleeding, bruising or enlarged lymph nodes.  Physical Exam  BP 104/70   Pulse 56  Temp(Src) 97.9 F   Resp 16  Ht 5' 10.25"   Wt 259 lb  BMI 36.91   General Appearance: Well nourished, in no apparent distress. Eyes: PERRLA, EOMs, conjunctiva no swelling or erythema, normal fundi and vessels. Sinuses: No frontal/maxillary tenderness ENT/Mouth: EACs patent / TMs  nl. Nares clear without erythema, swelling, mucoid exudates. Oral hygiene is good. No erythema, swelling, or exudate. Tongue normal, non-obstructing. Tonsils not swollen or erythematous. Hearing normal.  Neck: Supple, thyroid normal. No bruits, nodes or JVD. Respiratory: Respiratory effort normal.  BS equal and clear bilateral without rales, rhonci, wheezing or stridor. Cardio: Heart sounds are normal with regular rate and rhythm and no murmurs, rubs or gallops. Peripheral pulses are normal and equal bilaterally without edema. No aortic or femoral bruits. Chest: symmetric with normal excursions and percussion.  Abdomen: Flat, soft, with bowl sounds. Nontender, no guarding, rebound, hernias, masses, or organomegaly.  Lymphatics: Non tender without lymphadenopathy.  Genitourinary: No hernias.Testes nl. DRE - prostate nl for age - smooth & firm w/o nodules. Musculoskeletal: Full ROM all peripheral extremities, joint stability, 5/5 strength, and normal gait. Skin: Warm and dry without rashes, lesions, cyanosis, clubbing or  ecchymosis.  Neuro: Cranial nerves intact, reflexes equal bilaterally. Normal muscle tone, no cerebellar symptoms. Sensation intact.  Pysch: Awake and oriented X 3 with normal affect, insight and judgment appropriate.   Assessment and Plan  1. Annual Screening Examination 2. Hypertension  3. Hyperlipidemia 4. Pre Diabetes 5. Vitamin D Deficiency 6. Morbid obesity (BMI 36.91)   Continue prudent diet as discussed, weight control, BP monitoring,  regular exercise, and medications as discussed.  Discussed med effects and SE's. Routine screening labs and tests as requested with regular follow-up as recommended.

## 2014-10-08 ENCOUNTER — Encounter: Payer: Self-pay | Admitting: Internal Medicine

## 2014-10-08 ENCOUNTER — Ambulatory Visit (INDEPENDENT_AMBULATORY_CARE_PROVIDER_SITE_OTHER): Payer: 59 | Admitting: Internal Medicine

## 2014-10-08 VITALS — BP 104/70 | HR 56 | Temp 97.9°F | Resp 16 | Ht 70.25 in | Wt 259.0 lb

## 2014-10-08 DIAGNOSIS — Z125 Encounter for screening for malignant neoplasm of prostate: Secondary | ICD-10-CM

## 2014-10-08 DIAGNOSIS — R945 Abnormal results of liver function studies: Secondary | ICD-10-CM

## 2014-10-08 DIAGNOSIS — I10 Essential (primary) hypertension: Secondary | ICD-10-CM

## 2014-10-08 DIAGNOSIS — Z79899 Other long term (current) drug therapy: Secondary | ICD-10-CM

## 2014-10-08 DIAGNOSIS — R7989 Other specified abnormal findings of blood chemistry: Secondary | ICD-10-CM

## 2014-10-08 DIAGNOSIS — Z23 Encounter for immunization: Secondary | ICD-10-CM

## 2014-10-08 DIAGNOSIS — R6889 Other general symptoms and signs: Secondary | ICD-10-CM

## 2014-10-08 DIAGNOSIS — Z111 Encounter for screening for respiratory tuberculosis: Secondary | ICD-10-CM

## 2014-10-08 DIAGNOSIS — Z113 Encounter for screening for infections with a predominantly sexual mode of transmission: Secondary | ICD-10-CM

## 2014-10-08 DIAGNOSIS — Z1212 Encounter for screening for malignant neoplasm of rectum: Secondary | ICD-10-CM

## 2014-10-08 DIAGNOSIS — Z0001 Encounter for general adult medical examination with abnormal findings: Secondary | ICD-10-CM

## 2014-10-08 DIAGNOSIS — R7309 Other abnormal glucose: Secondary | ICD-10-CM

## 2014-10-08 DIAGNOSIS — E782 Mixed hyperlipidemia: Secondary | ICD-10-CM

## 2014-10-08 DIAGNOSIS — E559 Vitamin D deficiency, unspecified: Secondary | ICD-10-CM

## 2014-10-08 DIAGNOSIS — R7303 Prediabetes: Secondary | ICD-10-CM

## 2014-10-08 LAB — LIPID PANEL
CHOL/HDL RATIO: 7.7 ratio
CHOLESTEROL: 192 mg/dL (ref 0–200)
HDL: 25 mg/dL — AB (ref >39)
LDL Cholesterol: 128 mg/dL — ABNORMAL HIGH (ref 0–99)
Triglycerides: 195 mg/dL — ABNORMAL HIGH (ref <150)
VLDL: 39 mg/dL (ref 0–40)

## 2014-10-08 LAB — CBC WITH DIFFERENTIAL/PLATELET
BASOS ABS: 0.1 10*3/uL (ref 0.0–0.1)
BASOS PCT: 1 % (ref 0–1)
EOS ABS: 0.1 10*3/uL (ref 0.0–0.7)
EOS PCT: 2 % (ref 0–5)
HCT: 41.5 % (ref 39.0–52.0)
Hemoglobin: 14.6 g/dL (ref 13.0–17.0)
LYMPHS PCT: 38 % (ref 12–46)
Lymphs Abs: 2.4 10*3/uL (ref 0.7–4.0)
MCH: 28.6 pg (ref 26.0–34.0)
MCHC: 35.2 g/dL (ref 30.0–36.0)
MCV: 81.4 fL (ref 78.0–100.0)
MONO ABS: 0.5 10*3/uL (ref 0.1–1.0)
MPV: 11.1 fL (ref 9.4–12.4)
Monocytes Relative: 8 % (ref 3–12)
NEUTROS ABS: 3.2 10*3/uL (ref 1.7–7.7)
Neutrophils Relative %: 51 % (ref 43–77)
PLATELETS: 221 10*3/uL (ref 150–400)
RBC: 5.1 MIL/uL (ref 4.22–5.81)
RDW: 14.1 % (ref 11.5–15.5)
WBC: 6.2 10*3/uL (ref 4.0–10.5)

## 2014-10-08 LAB — BASIC METABOLIC PANEL WITH GFR
BUN: 16 mg/dL (ref 6–23)
CALCIUM: 9.8 mg/dL (ref 8.4–10.5)
CHLORIDE: 104 meq/L (ref 96–112)
CO2: 30 mEq/L (ref 19–32)
Creat: 1.12 mg/dL (ref 0.50–1.35)
GFR, Est African American: 86 mL/min
GFR, Est Non African American: 75 mL/min
GLUCOSE: 97 mg/dL (ref 70–99)
POTASSIUM: 4.4 meq/L (ref 3.5–5.3)
Sodium: 141 mEq/L (ref 135–145)

## 2014-10-08 LAB — HEPATIC FUNCTION PANEL
ALK PHOS: 62 U/L (ref 39–117)
ALT: 22 U/L (ref 0–53)
AST: 21 U/L (ref 0–37)
Albumin: 4.4 g/dL (ref 3.5–5.2)
BILIRUBIN INDIRECT: 1.3 mg/dL — AB (ref 0.2–1.2)
Bilirubin, Direct: 0.2 mg/dL (ref 0.0–0.3)
TOTAL PROTEIN: 7.1 g/dL (ref 6.0–8.3)
Total Bilirubin: 1.5 mg/dL — ABNORMAL HIGH (ref 0.2–1.2)

## 2014-10-08 LAB — VITAMIN B12: VITAMIN B 12: 564 pg/mL (ref 211–911)

## 2014-10-08 LAB — TSH: TSH: 2.984 u[IU]/mL (ref 0.350–4.500)

## 2014-10-08 LAB — MAGNESIUM: Magnesium: 1.9 mg/dL (ref 1.5–2.5)

## 2014-10-08 NOTE — Patient Instructions (Signed)

## 2014-10-09 LAB — URINALYSIS, MICROSCOPIC ONLY
BACTERIA UA: NONE SEEN
CASTS: NONE SEEN
Crystals: NONE SEEN
SQUAMOUS EPITHELIAL / LPF: NONE SEEN

## 2014-10-09 LAB — HEMOGLOBIN A1C
Hgb A1c MFr Bld: 6 % — ABNORMAL HIGH (ref <5.7)
Mean Plasma Glucose: 126 mg/dL — ABNORMAL HIGH (ref <117)

## 2014-10-09 LAB — RPR

## 2014-10-09 LAB — HIV ANTIBODY (ROUTINE TESTING W REFLEX): HIV: NONREACTIVE

## 2014-10-09 LAB — VITAMIN D 25 HYDROXY (VIT D DEFICIENCY, FRACTURES): Vit D, 25-Hydroxy: 55 ng/mL (ref 30–100)

## 2014-10-09 LAB — HEPATITIS C ANTIBODY: HCV Ab: NEGATIVE

## 2014-10-09 LAB — MICROALBUMIN / CREATININE URINE RATIO
Creatinine, Urine: 274.4 mg/dL
MICROALB UR: 1.1 mg/dL (ref <2.0)
Microalb Creat Ratio: 4 mg/g (ref 0.0–30.0)

## 2014-10-09 LAB — HEPATITIS A ANTIBODY, TOTAL: HEP A TOTAL AB: REACTIVE — AB

## 2014-10-09 LAB — INSULIN, FASTING: Insulin fasting, serum: 13.6 u[IU]/mL (ref 2.0–19.6)

## 2014-10-09 LAB — TESTOSTERONE: Testosterone: 204 ng/dL — ABNORMAL LOW (ref 300–890)

## 2014-10-09 LAB — PSA: PSA: 1.57 ng/mL (ref <=4.00)

## 2014-10-09 LAB — HEPATITIS B SURFACE ANTIBODY,QUALITATIVE: Hep B S Ab: NEGATIVE

## 2014-10-09 LAB — HEPATITIS B CORE ANTIBODY, TOTAL: HEP B C TOTAL AB: NONREACTIVE

## 2014-10-11 LAB — HEPATITIS B E ANTIBODY: Hepatitis Be Antibody: NONREACTIVE

## 2014-10-18 LAB — TB SKIN TEST
INDURATION: 0 mm
TB SKIN TEST: NEGATIVE

## 2014-11-05 ENCOUNTER — Other Ambulatory Visit (INDEPENDENT_AMBULATORY_CARE_PROVIDER_SITE_OTHER): Payer: 59

## 2014-11-05 DIAGNOSIS — Z1212 Encounter for screening for malignant neoplasm of rectum: Secondary | ICD-10-CM

## 2014-11-05 LAB — POC HEMOCCULT BLD/STL (HOME/3-CARD/SCREEN)
Card #2 Fecal Occult Blod, POC: NEGATIVE
FECAL OCCULT BLD: NEGATIVE
Fecal Occult Blood, POC: NEGATIVE

## 2014-12-16 ENCOUNTER — Other Ambulatory Visit: Payer: Self-pay | Admitting: Internal Medicine

## 2015-01-28 ENCOUNTER — Ambulatory Visit: Payer: Self-pay | Admitting: Physician Assistant

## 2015-02-02 ENCOUNTER — Other Ambulatory Visit: Payer: Self-pay | Admitting: Internal Medicine

## 2015-04-07 ENCOUNTER — Other Ambulatory Visit: Payer: Self-pay | Admitting: Internal Medicine

## 2015-05-06 ENCOUNTER — Ambulatory Visit: Payer: Self-pay | Admitting: Internal Medicine

## 2015-06-02 NOTE — Patient Instructions (Signed)

## 2015-06-02 NOTE — Progress Notes (Signed)
Patient ID: Torren Maffeo, male   DOB: 08/28/1961, 54 y.o.   MRN: [id number]   This very nice 54 y.o. Marries [place] Male presents for  follow up with Hypertension, Hyperlipidemia, Pre-Diabetes and Vitamin D Deficiency. Patient also has OSA and has been on CPAP since 2009.    Patient is treated for HTN since 2010 & BP has been controlled at home. Today's BP: 120/76 mmHg. Patient has had no complaints of any cardiac type chest pain, palpitations, dyspnea/orthopnea/PND, dizziness, claudication, or dependent edema.   Hyperlipidemia is not controlled with diet & meds altho he has lost 20 # since his last OV attributed to better eating habits and a more physical job.  Patient denies myalgias or other med SE's. Last Lipids were  Cholesterol 192; HDL 25*; LDL 128*; Triglycerides 195 on 10/08/2014.   Also, the patient has history of Morbid Obesity (BMI 35.49) and consequent PreDiabetes since Feb 2014 with A1c 6.1% and elevated Insulin 39. He has had no symptoms of reactive hypoglycemia, diabetic polys, paresthesias or visual blurring.  Last A1c was 6.0% on 10/08/2014.    Further, the patient also has history of Vitamin D Deficiency and supplements vitamin D without any suspected side-effects. Last vitamin D was  55 on 10/08/2014.     Medication Sig  . atorvastatin  80 MG tablet TAKE ONE TAB ONCE DAILY FOR CHOLESTEROL  . BABY ASPIRIN PO Take  daily.  . bisoprolol-hctz (ZIAC) 5-6.25 MG  TAKE ONE TAB ONCE DAILY FOR BLOOD PRESSURE  . VITAMIN D Take 5,000 Int'l Units daily.  Marland Kitchen VITAMIN B-12  Take 1 tab daily.  Marland Kitchen FISH OIL  Take 1,200 mg  daily.   No Known Allergies  PMHx:   Past Medical History  Diagnosis Date  . Hyperlipidemia   . Hypertension   . Vitamin D deficiency   . Prediabetes    Immunization History  Administered Date(s) Administered  . Influenza Split 10/08/2014  . PPD Test 10/08/2014  . Tdap 08/14/2009   Past Surgical History  Procedure Laterality Date  . Hemorrhoid banding  2013   . Nasal septum surgery  2004   FHx:    Reviewed / unchanged  SHx:    Reviewed / unchanged  Systems Review:  Constitutional: Denies fever, chills, wt changes, headaches, insomnia, fatigue, night sweats, change in appetite. Eyes: Denies redness, blurred vision, diplopia, discharge, itchy, watery eyes.  ENT: Denies discharge, congestion, post nasal drip, epistaxis, sore throat, earache, hearing loss, dental pain, tinnitus, vertigo, sinus pain, snoring.  CV: Denies chest pain, palpitations, irregular heartbeat, syncope, dyspnea, diaphoresis, orthopnea, PND, claudication or edema. Respiratory: denies cough, dyspnea, DOE, pleurisy, hoarseness, laryngitis, wheezing.  Gastrointestinal: Denies dysphagia, odynophagia, heartburn, reflux, water brash, abdominal pain or cramps, nausea, vomiting, bloating, diarrhea, constipation, hematemesis, melena, hematochezia  or hemorrhoids. Genitourinary: Denies dysuria, frequency, urgency, nocturia, hesitancy, discharge, hematuria or flank pain. Musculoskeletal: Denies arthralgias, myalgias, stiffness, jt. swelling, pain, limping or strain/sprain.  Skin: Denies pruritus, rash, hives, warts, acne, eczema or change in skin lesion(s). Neuro: No weakness, tremor, incoordination, spasms, paresthesia or pain. Psychiatric: Denies confusion, memory loss or sensory loss. Endo: Denies change in weight, skin or hair change.  Heme/Lymph: No excessive bleeding, bruising or enlarged lymph nodes.  Physical Exam  BP 120/76   Pulse 56  Temp 97.3 F   Resp 16  Ht 5' 10.25"   Wt 249 lb     BMI 35.49   Appears well nourished and in no distress. Eyes: PERRLA, EOMs, conjunctiva no swelling  or erythema. Sinuses: No frontal/maxillary tenderness ENT/Mouth: EAC's clear, TM's nl w/o erythema, bulging. Nares clear w/o erythema, swelling, exudates. Oropharynx clear without erythema or exudates. Oral hygiene is good. Tongue normal, non obstructing. Hearing intact.  Neck: Supple.  Thyroid nl. Car 2+/2+ without bruits, nodes or JVD. Chest: Respirations nl with BS clear & equal w/o rales, rhonchi, wheezing or stridor.  Cor: Heart sounds normal w/ regular rate and rhythm without sig. murmurs, gallops, clicks, or rubs. Peripheral pulses normal and equal  without edema.  Abdomen: Soft & bowel sounds normal. Non-tender w/o guarding, rebound, hernias, masses, or organomegaly.  Lymphatics: Unremarkable.  Musculoskeletal: Full ROM all peripheral extremities, joint stability, 5/5 strength, and normal gait.  Skin: Warm, dry without  ecchymosis apparent. Does have scattered excoriated lesiond consistent with Poison Ivy exposure on the extremities.  Neuro: Cranial nerves intact, reflexes equal bilaterally. Sensory-motor testing grossly intact. Tendon reflexes grossly intact.  Pysch: Alert & oriented x 3.  Insight and judgement nl & appropriate. No ideations.  Assessment and Plan:  1. Essential hypertension  - TSH  2. Hyperlipidemia  - Lipid panel  3. Prediabetes  - Hemoglobin A1c - Insulin, random  4. Vitamin D deficiency  - Vit D  25 hydroxy (rtn osteoporosis monitoring)  5. Medication management  - CBC with Differential/Platelet - BASIC METABOLIC PANEL WITH GFR - Hepatic function panel - Magnesium  6. BMI 36.0-36.9,adult  7. Poison ivy  - predniSONE (DELTASONE) 20 MG tablet; 1 tab 3 x day for 3 days, then 1 tab 2 x day for 3 days, then 1 tab 1 x day for 5 days  Dispense: 20 tablet; Refill: 0   Recommended regular exercise, BP monitoring, weight control, and discussed med and SE's. Recommended labs to assess and monitor clinical status. Further disposition pending results of labs. Over 30 minutes of exam, counseling, chart review was performed

## 2015-06-03 ENCOUNTER — Other Ambulatory Visit: Payer: Self-pay | Admitting: Internal Medicine

## 2015-06-03 ENCOUNTER — Encounter: Payer: Self-pay | Admitting: Internal Medicine

## 2015-06-03 ENCOUNTER — Ambulatory Visit (INDEPENDENT_AMBULATORY_CARE_PROVIDER_SITE_OTHER): Payer: Commercial Managed Care - HMO | Admitting: Internal Medicine

## 2015-06-03 VITALS — BP 120/76 | HR 56 | Temp 97.3°F | Resp 16 | Ht 70.25 in | Wt 249.0 lb

## 2015-06-03 DIAGNOSIS — L237 Allergic contact dermatitis due to plants, except food: Secondary | ICD-10-CM

## 2015-06-03 DIAGNOSIS — G4733 Obstructive sleep apnea (adult) (pediatric): Secondary | ICD-10-CM

## 2015-06-03 DIAGNOSIS — R7309 Other abnormal glucose: Secondary | ICD-10-CM

## 2015-06-03 DIAGNOSIS — R7303 Prediabetes: Secondary | ICD-10-CM

## 2015-06-03 DIAGNOSIS — Z79899 Other long term (current) drug therapy: Secondary | ICD-10-CM

## 2015-06-03 DIAGNOSIS — Z6836 Body mass index (BMI) 36.0-36.9, adult: Secondary | ICD-10-CM

## 2015-06-03 DIAGNOSIS — E782 Mixed hyperlipidemia: Secondary | ICD-10-CM

## 2015-06-03 DIAGNOSIS — I10 Essential (primary) hypertension: Secondary | ICD-10-CM

## 2015-06-03 DIAGNOSIS — Z9989 Dependence on other enabling machines and devices: Secondary | ICD-10-CM

## 2015-06-03 DIAGNOSIS — E559 Vitamin D deficiency, unspecified: Secondary | ICD-10-CM

## 2015-06-03 LAB — CBC WITH DIFFERENTIAL/PLATELET
BASOS ABS: 0.1 10*3/uL (ref 0.0–0.1)
BASOS PCT: 1 % (ref 0–1)
EOS ABS: 0.1 10*3/uL (ref 0.0–0.7)
Eosinophils Relative: 2 % (ref 0–5)
HCT: 43.5 % (ref 39.0–52.0)
Hemoglobin: 14.7 g/dL (ref 13.0–17.0)
LYMPHS ABS: 2.1 10*3/uL (ref 0.7–4.0)
Lymphocytes Relative: 37 % (ref 12–46)
MCH: 28.3 pg (ref 26.0–34.0)
MCHC: 33.8 g/dL (ref 30.0–36.0)
MCV: 83.8 fL (ref 78.0–100.0)
MONOS PCT: 10 % (ref 3–12)
MPV: 10.8 fL (ref 8.6–12.4)
Monocytes Absolute: 0.6 10*3/uL (ref 0.1–1.0)
NEUTROS ABS: 2.9 10*3/uL (ref 1.7–7.7)
Neutrophils Relative %: 50 % (ref 43–77)
PLATELETS: 191 10*3/uL (ref 150–400)
RBC: 5.19 MIL/uL (ref 4.22–5.81)
RDW: 14 % (ref 11.5–15.5)
WBC: 5.7 10*3/uL (ref 4.0–10.5)

## 2015-06-03 LAB — BASIC METABOLIC PANEL WITH GFR
BUN: 19 mg/dL (ref 7–25)
CHLORIDE: 105 mmol/L (ref 98–110)
CO2: 25 mmol/L (ref 20–31)
Calcium: 9.2 mg/dL (ref 8.6–10.3)
Creat: 1.03 mg/dL (ref 0.70–1.33)
GFR, Est African American: >89 mL/min (ref >=60)
GFR, Est Non African American: 82 mL/min (ref >=60)
GLUCOSE: 98 mg/dL (ref 65–99)
POTASSIUM: 3.9 mmol/L (ref 3.5–5.3)
SODIUM: 141 mmol/L (ref 135–146)

## 2015-06-03 LAB — HEPATIC FUNCTION PANEL
ALBUMIN: 4.4 g/dL (ref 3.6–5.1)
ALK PHOS: 52 U/L (ref 40–115)
ALT: 18 U/L (ref 9–46)
AST: 17 U/L (ref 10–35)
Bilirubin, Direct: 0.2 mg/dL (ref <=0.2)
Indirect Bilirubin: 0.9 mg/dL (ref 0.2–1.2)
TOTAL PROTEIN: 6.8 g/dL (ref 6.1–8.1)
Total Bilirubin: 1.1 mg/dL (ref 0.2–1.2)

## 2015-06-03 LAB — LIPID PANEL
Cholesterol: 200 mg/dL (ref 125–200)
HDL: 26 mg/dL — ABNORMAL LOW (ref >=40)
LDL Cholesterol: 128 mg/dL (ref <130)
Total CHOL/HDL Ratio: 7.7 Ratio — ABNORMAL HIGH (ref <=5.0)
Triglycerides: 230 mg/dL — ABNORMAL HIGH (ref <150)
VLDL: 46 mg/dL — AB (ref <30)

## 2015-06-03 LAB — HEMOGLOBIN A1C
Hgb A1c MFr Bld: 6.1 % — ABNORMAL HIGH (ref <5.7)
Mean Plasma Glucose: 128 mg/dL — ABNORMAL HIGH (ref <117)

## 2015-06-03 LAB — TSH: TSH: 2.348 u[IU]/mL (ref 0.350–4.500)

## 2015-06-03 LAB — MAGNESIUM: MAGNESIUM: 2 mg/dL (ref 1.5–2.5)

## 2015-06-03 MED ORDER — PREDNISONE 20 MG PO TABS: ORAL_TABLET | ORAL | Status: DC

## 2015-06-04 LAB — INSULIN, RANDOM: INSULIN: 28.4 u[IU]/mL — AB (ref 2.0–19.6)

## 2015-06-04 LAB — VITAMIN D 25 HYDROXY (VIT D DEFICIENCY, FRACTURES): VIT D 25 HYDROXY: 45 ng/mL (ref 30–100)

## 2015-06-10 ENCOUNTER — Other Ambulatory Visit: Payer: Self-pay | Admitting: *Deleted

## 2015-06-10 MED ORDER — ATORVASTATIN CALCIUM 80 MG PO TABS: ORAL_TABLET | ORAL | Status: DC

## 2015-07-08 ENCOUNTER — Other Ambulatory Visit: Payer: Self-pay | Admitting: Internal Medicine

## 2015-09-09 ENCOUNTER — Ambulatory Visit: Payer: Self-pay | Admitting: Internal Medicine

## 2015-10-14 ENCOUNTER — Ambulatory Visit: Payer: Self-pay | Admitting: Internal Medicine

## 2015-10-21 ENCOUNTER — Encounter: Payer: Self-pay | Admitting: Internal Medicine

## 2015-10-21 ENCOUNTER — Ambulatory Visit (INDEPENDENT_AMBULATORY_CARE_PROVIDER_SITE_OTHER): Payer: Commercial Managed Care - HMO | Admitting: Internal Medicine

## 2015-10-21 VITALS — BP 122/80 | HR 60 | Temp 97.2°F | Resp 16 | Ht 69.75 in | Wt 252.8 lb

## 2015-10-21 DIAGNOSIS — R5383 Other fatigue: Secondary | ICD-10-CM

## 2015-10-21 DIAGNOSIS — Z111 Encounter for screening for respiratory tuberculosis: Secondary | ICD-10-CM | POA: Diagnosis not present

## 2015-10-21 DIAGNOSIS — R7303 Prediabetes: Secondary | ICD-10-CM

## 2015-10-21 DIAGNOSIS — I10 Essential (primary) hypertension: Secondary | ICD-10-CM

## 2015-10-21 DIAGNOSIS — Z0000 Encounter for general adult medical examination without abnormal findings: Secondary | ICD-10-CM | POA: Diagnosis not present

## 2015-10-21 DIAGNOSIS — E782 Mixed hyperlipidemia: Secondary | ICD-10-CM

## 2015-10-21 DIAGNOSIS — Z9989 Dependence on other enabling machines and devices: Secondary | ICD-10-CM

## 2015-10-21 DIAGNOSIS — Z125 Encounter for screening for malignant neoplasm of prostate: Secondary | ICD-10-CM

## 2015-10-21 DIAGNOSIS — Z6835 Body mass index (BMI) 35.0-35.9, adult: Secondary | ICD-10-CM

## 2015-10-21 DIAGNOSIS — Z1212 Encounter for screening for malignant neoplasm of rectum: Secondary | ICD-10-CM

## 2015-10-21 DIAGNOSIS — G4733 Obstructive sleep apnea (adult) (pediatric): Secondary | ICD-10-CM

## 2015-10-21 DIAGNOSIS — Z0001 Encounter for general adult medical examination with abnormal findings: Secondary | ICD-10-CM

## 2015-10-21 DIAGNOSIS — Z79899 Other long term (current) drug therapy: Secondary | ICD-10-CM

## 2015-10-21 DIAGNOSIS — E559 Vitamin D deficiency, unspecified: Secondary | ICD-10-CM

## 2015-10-21 NOTE — Progress Notes (Signed)
Patient ID: Larry Hudson, male   DOB: Apr 18, 1961, 54 y.o.   MRN: [id number]  Annual  Screening/Preventative Visit And Comprehensive Evaluation & Examination  This very nice 54 y.o. married panamanian presents for presents for a Wellness/Preventative Visit & comprehensive evaluation and management of multiple medical co-morbidities.  Patient has been followed for HTN, Prediabetes, Hyperlipidemia and Vitamin D Deficiency. Patient also has OSA  Since 2009 and is off his CPAP due to a defective mask.   HTN predates since 2010. Patient's BP has been controlled at home.Today's BP: 122/80 mmHg. Patient denies any cardiac symptoms as chest pain, palpitations, shortness of breath, dizziness or ankle swelling.   Patient's hyperlipidemia is not controlled with  Medications and his non-compliant diet . Patient denies myalgias or other medication SE's. Last lipids were not at goal with Cholesterol 200; HDL 26*; LDL 128; & elevated Triglycerides 230 on 06/03/2015.    Patient has Morbid Obesity (BMI 36+) and consequent prediabetes since 2013 with A1c 6/1%. Patient is not compliant with dieting.  Patient denies reactive hypoglycemic symptoms, visual blurring, diabetic polys or paresthesias. Last A1c was 6.1% on 06/03/2015.     Finally, patient has history of Vitamin D Deficiency and last vitamin D was 45 on 06/03/2015.  Medication Sig  . atorvastatin  80 MG tablet TAKE ONE TAB ONCE DAILY FOR CHOLESTEROL  . BABY ASPIRIN PO Take 81 mgdaily.  . bisoprolol-hctz (ZIAC) 5-6.25  TAKE ONE TABL ONCE DAILY FOR BLOOD PRESSURE  . VITAMIN D Take 5,000 Int'l Units  daily.  Marland Kitchen VITAMIN B-12 Take 1 tab daily.  . Omega-3  FISH OIL  Take 1,200 mg  daily.   No Known Allergies   Past Medical History  Diagnosis Date  . Hyperlipidemia   . Hypertension   . Vitamin D deficiency   . Prediabetes    Health Maintenance  Topic Date Due  . INFLUENZA VACCINE  06/10/2015  . TETANUS/TDAP  08/15/2019  . COLONOSCOPY  11/25/2021  .  Hepatitis C Screening  Completed  . HIV Screening  Completed   Immunization History  Administered Date(s) Administered  . Influenza Split 10/08/2014  . Influenza-Unspecified 08/07/2015  . PPD Test 10/08/2014, 10/21/2015  . Tdap 08/14/2009   Past Surgical History  Procedure Laterality Date  . Hemorrhoid banding  2013  . Nasal septum surgery  2004   Family History  Problem Relation Age of Onset  . Hyperlipidemia Mother   . Heart attack Mother   . Heart disease Father   . Emphysema Father     Social History   Social History  . Marital Status: Married    Spouse Name: N/A  . Number of Children: N/A  . Years of Education: N/A   Occupational History  . Not on file.   Social History Main Topics  . Smoking status: Former Games developer  . Smokeless tobacco: Never Used  . Alcohol Use: No  . Drug Use: No  . Sexual Activity: Not on file    ROS Constitutional: Denies fever, chills, weight loss/gain, headaches, insomnia,  night sweats or change in appetite. Does c/o fatigue. Eyes: Denies redness, blurred vision, diplopia, discharge, itchy or watery eyes.  ENT: Denies discharge, congestion, post nasal drip, epistaxis, sore throat, earache, hearing loss, dental pain, Tinnitus, Vertigo, Sinus pain or snoring.  Cardio: Denies chest pain, palpitations, irregular heartbeat, syncope, dyspnea, diaphoresis, orthopnea, PND, claudication or edema Respiratory: denies cough, dyspnea, DOE, pleurisy, hoarseness, laryngitis or wheezing.  Gastrointestinal: Denies dysphagia, heartburn, reflux, water brash, pain, cramps, nausea,  vomiting, bloating, diarrhea, constipation, hematemesis, melena, hematochezia, jaundice or hemorrhoids Genitourinary: Denies dysuria, frequency, urgency, nocturia, hesitancy, discharge, hematuria or flank pain Musculoskeletal: Denies arthralgia, myalgia, stiffness, Jt. Swelling, pain, limp or strain/sprain. Denies Falls. Skin: Denies puritis, rash, hives, warts, acne, eczema or  change in skin lesion Neuro: No weakness, tremor, incoordination, spasms, paresthesia or pain Psychiatric: Denies confusion, memory loss or sensory loss. Denies Depression. Endocrine: Denies change in weight, skin, hair change, nocturia, and paresthesia, diabetic polys, visual blurring or hyper / hypo glycemic episodes.  Heme/Lymph: No excessive bleeding, bruising or enlarged lymph nodes.  Physical Exam  BP 122/80 mmHg  Pulse 60  Temp(Src) 97.2 F (36.2 C)  Resp 16  Ht 5' 9.75" (1.772 m)  Wt 252 lb 12.8 oz (114.669 kg)  BMI 36.52 kg/m2  General Appearance: Over nourished, in no apparent distress. Eyes: PERRLA, EOMs, conjunctiva no swelling or erythema, normal fundi and vessels. Sinuses: No frontal/maxillary tenderness ENT/Mouth: EACs patent / TMs  nl. Nares clear without erythema, swelling, mucoid exudates. Oral hygiene is good. No erythema, swelling, or exudate. Tongue normal, non-obstructing. Tonsils not swollen or erythematous. Hearing normal.  Neck: Supple, thyroid normal. No bruits, nodes or JVD. Respiratory: Respiratory effort normal.  BS equal and clear bilateral without rales, rhonci, wheezing or stridor. Cardio: Heart sounds are normal with regular rate and rhythm and no murmurs, rubs or gallops. Peripheral pulses are normal and equal bilaterally without edema. No aortic or femoral bruits. Chest: symmetric with normal excursions and percussion.  Abdomen: Flat, soft, with bowl sounds. Nontender, no guarding, rebound, hernias, masses, or organomegaly.  Lymphatics: Non tender without lymphadenopathy.  Genitourinary: No hernias.Testes nl. DRE - prostate nl for age - smooth & firm w/o nodules. Musculoskeletal: Full ROM all peripheral extremities, joint stability, 5/5 strength, and normal gait. Skin: Warm and dry without rashes, lesions, cyanosis, clubbing or  ecchymosis.  Neuro: Cranial nerves intact, reflexes equal bilaterally. Normal muscle tone, no cerebellar symptoms. Sensation  intact.  Pysch: Alert and oriented X 3 with normal affect, insight and judgment appropriate.   Assessment and Plan  1. Annual Preventative Visit and Comprehensive Evaluation & Examination  - Microalbumin / creatinine urine ratio - EKG 12-Lead - US, RETROPERITNL ABD,  LTD - POC Hemoccult Bld/Stl  - Vitamin B12 - Iron and TIBC - PSA - Testosterone - CBC with Differential/Platelet - BASIC METABOLIC PANEL WITH GFR - Hepatic function panel - Magnesium - Lipid panel - TSH - Hemoglobin A1c - Insulin, random - VITAMIN D 25 Hydroxy   2. Essential hypertension  - Microalbumin / creatinine urine ratio - EKG 12-Lead - US, RETROPERITNL ABD,  LTD - TSH  3. Hyperlipidemia  - Lipid panel - TSH  4. Prediabetes  - Hemoglobin A1c - Insulin, random  5. Vitamin D deficiency  - VITAMIN D 25 Hydroxy   6. Morbid obesity, unspecified obesity type (HCC)   7. OSA on CPAP  - Rx for a new CPAP mask  8. Screening for rectal cancer  - POC Hemoccult Bld/Stl   9. Prostate cancer screening  - PSA  10. Other fatigue  - Vitamin B12 - Iron and TIBC - Testosterone - TSH  11. Screening examination for pulmonary tuberculosis  - PPD  12. BMI 35.0-35.9,adult   13. Medication management  - CBC with Differential/Platelet - BASIC METABOLIC PANEL WITH GFR - Hepatic function panel - Magnesium - Urinalysis, Routine w reflex microscopic    Continue prudent diet as discussed, weight control, BP monitoring, regular exercise, and medications  as discussed.  Discussed med effects and SE's. Routine screening labs and tests as requested with regular follow-up as recommended. Over 40 minutes of exam, counseling, chart review and high complex critical decision making was performed

## 2015-10-21 NOTE — Patient Instructions (Signed)

## 2015-10-22 ENCOUNTER — Other Ambulatory Visit: Payer: Self-pay | Admitting: Internal Medicine

## 2015-10-22 LAB — LIPID PANEL
Cholesterol: 223 mg/dL — ABNORMAL HIGH (ref 125–200)
HDL: 29 mg/dL — ABNORMAL LOW (ref >=40)
LDL CALC: 158 mg/dL — AB (ref <130)
Total CHOL/HDL Ratio: 7.7 Ratio — ABNORMAL HIGH (ref <=5.0)
Triglycerides: 178 mg/dL — ABNORMAL HIGH (ref <150)
VLDL: 36 mg/dL — ABNORMAL HIGH (ref <30)

## 2015-10-22 LAB — URINALYSIS, ROUTINE W REFLEX MICROSCOPIC
Bilirubin Urine: NEGATIVE
GLUCOSE, UA: NEGATIVE
HGB URINE DIPSTICK: NEGATIVE
KETONES UR: NEGATIVE
Leukocytes, UA: NEGATIVE
Nitrite: NEGATIVE
PH: 6 (ref 5.0–8.0)
Protein, ur: NEGATIVE
Specific Gravity, Urine: 1.018 (ref 1.001–1.035)

## 2015-10-22 LAB — HEPATIC FUNCTION PANEL
ALBUMIN: 4.4 g/dL (ref 3.6–5.1)
ALK PHOS: 57 U/L (ref 40–115)
ALT: 18 U/L (ref 9–46)
AST: 20 U/L (ref 10–35)
Bilirubin, Direct: 0.2 mg/dL (ref <=0.2)
Indirect Bilirubin: 1.2 mg/dL (ref 0.2–1.2)
TOTAL PROTEIN: 6.9 g/dL (ref 6.1–8.1)
Total Bilirubin: 1.4 mg/dL — ABNORMAL HIGH (ref 0.2–1.2)

## 2015-10-22 LAB — CBC WITH DIFFERENTIAL/PLATELET
BASOS ABS: 0 10*3/uL (ref 0.0–0.1)
Basophils Relative: 0 % (ref 0–1)
EOS PCT: 2 % (ref 0–5)
Eosinophils Absolute: 0.1 10*3/uL (ref 0.0–0.7)
HEMATOCRIT: 43.7 % (ref 39.0–52.0)
Hemoglobin: 15.4 g/dL (ref 13.0–17.0)
LYMPHS ABS: 2.2 10*3/uL (ref 0.7–4.0)
LYMPHS PCT: 36 % (ref 12–46)
MCH: 29.2 pg (ref 26.0–34.0)
MCHC: 35.2 g/dL (ref 30.0–36.0)
MCV: 82.8 fL (ref 78.0–100.0)
MONOS PCT: 9 % (ref 3–12)
MPV: 10.9 fL (ref 8.6–12.4)
Monocytes Absolute: 0.5 10*3/uL (ref 0.1–1.0)
Neutro Abs: 3.2 10*3/uL (ref 1.7–7.7)
Neutrophils Relative %: 53 % (ref 43–77)
Platelets: 208 10*3/uL (ref 150–400)
RBC: 5.28 MIL/uL (ref 4.22–5.81)
RDW: 13.7 % (ref 11.5–15.5)
WBC: 6 10*3/uL (ref 4.0–10.5)

## 2015-10-22 LAB — MICROALBUMIN / CREATININE URINE RATIO
Creatinine, Urine: 181 mg/dL (ref 20–370)
Microalb Creat Ratio: 3 mcg/mg creat (ref <30)
Microalb, Ur: 0.5 mg/dL

## 2015-10-22 LAB — TSH: TSH: 1.82 u[IU]/mL (ref 0.350–4.500)

## 2015-10-22 LAB — VITAMIN D 25 HYDROXY (VIT D DEFICIENCY, FRACTURES): Vit D, 25-Hydroxy: 54 ng/mL (ref 30–100)

## 2015-10-22 LAB — BASIC METABOLIC PANEL WITH GFR
BUN: 13 mg/dL (ref 7–25)
CHLORIDE: 103 mmol/L (ref 98–110)
CO2: 25 mmol/L (ref 20–31)
Calcium: 9.6 mg/dL (ref 8.6–10.3)
Creat: 1.06 mg/dL (ref 0.70–1.33)
GFR, Est Non African American: 79 mL/min (ref >=60)
GLUCOSE: 92 mg/dL (ref 65–99)
POTASSIUM: 4.3 mmol/L (ref 3.5–5.3)
Sodium: 141 mmol/L (ref 135–146)

## 2015-10-22 LAB — IRON AND TIBC
%SAT: 27 % (ref 15–60)
IRON: 82 ug/dL (ref 50–180)
TIBC: 304 ug/dL (ref 250–425)
UIBC: 222 ug/dL (ref 125–400)

## 2015-10-22 LAB — PSA: PSA: 1.72 ng/mL (ref <=4.00)

## 2015-10-22 LAB — TESTOSTERONE: Testosterone: 276 ng/dL — ABNORMAL LOW (ref 300–890)

## 2015-10-22 LAB — INSULIN, RANDOM: INSULIN: 21.6 u[IU]/mL — AB (ref 2.0–19.6)

## 2015-10-22 LAB — HEMOGLOBIN A1C
Hgb A1c MFr Bld: 6 % — ABNORMAL HIGH (ref <5.7)
MEAN PLASMA GLUCOSE: 126 mg/dL — AB (ref <117)

## 2015-10-22 LAB — VITAMIN B12: VITAMIN B 12: 607 pg/mL (ref 211–911)

## 2015-10-22 LAB — MAGNESIUM: Magnesium: 1.9 mg/dL (ref 1.5–2.5)

## 2015-12-09 ENCOUNTER — Other Ambulatory Visit: Payer: Self-pay | Admitting: *Deleted

## 2015-12-09 DIAGNOSIS — Z1212 Encounter for screening for malignant neoplasm of rectum: Secondary | ICD-10-CM

## 2015-12-09 DIAGNOSIS — Z0001 Encounter for general adult medical examination with abnormal findings: Secondary | ICD-10-CM

## 2015-12-09 LAB — POC HEMOCCULT BLD/STL (HOME/3-CARD/SCREEN)
Card #2 Fecal Occult Blod, POC: NEGATIVE
Card #3 Fecal Occult Blood, POC: NEGATIVE
FECAL OCCULT BLD: NEGATIVE

## 2016-01-25 ENCOUNTER — Other Ambulatory Visit: Payer: Self-pay | Admitting: Internal Medicine

## 2016-01-27 ENCOUNTER — Other Ambulatory Visit: Payer: Self-pay | Admitting: *Deleted

## 2016-01-27 MED ORDER — BISOPROLOL-HYDROCHLOROTHIAZIDE 5-6.25 MG PO TABS: ORAL_TABLET | ORAL | Status: DC

## 2016-02-03 ENCOUNTER — Encounter: Payer: Self-pay | Admitting: Internal Medicine

## 2016-02-03 ENCOUNTER — Ambulatory Visit (INDEPENDENT_AMBULATORY_CARE_PROVIDER_SITE_OTHER): Payer: BLUE CROSS/BLUE SHIELD | Admitting: Internal Medicine

## 2016-02-03 VITALS — BP 118/80 | HR 62 | Temp 98.0°F | Resp 18 | Ht 70.25 in | Wt 254.0 lb

## 2016-02-03 DIAGNOSIS — R7303 Prediabetes: Secondary | ICD-10-CM

## 2016-02-03 DIAGNOSIS — E782 Mixed hyperlipidemia: Secondary | ICD-10-CM

## 2016-02-03 DIAGNOSIS — I10 Essential (primary) hypertension: Secondary | ICD-10-CM

## 2016-02-03 DIAGNOSIS — Z79899 Other long term (current) drug therapy: Secondary | ICD-10-CM | POA: Diagnosis not present

## 2016-02-03 DIAGNOSIS — E559 Vitamin D deficiency, unspecified: Secondary | ICD-10-CM

## 2016-02-03 LAB — CBC WITH DIFFERENTIAL/PLATELET
Basophils Absolute: 0 10*3/uL (ref 0.0–0.1)
Basophils Relative: 0 % (ref 0–1)
Eosinophils Absolute: 0.2 10*3/uL (ref 0.0–0.7)
Eosinophils Relative: 3 % (ref 0–5)
HEMATOCRIT: 43.3 % (ref 39.0–52.0)
Hemoglobin: 14.8 g/dL (ref 13.0–17.0)
LYMPHS ABS: 2.5 10*3/uL (ref 0.7–4.0)
LYMPHS PCT: 37 % (ref 12–46)
MCH: 29.1 pg (ref 26.0–34.0)
MCHC: 34.2 g/dL (ref 30.0–36.0)
MCV: 85.1 fL (ref 78.0–100.0)
MONOS PCT: 8 % (ref 3–12)
MPV: 11 fL (ref 8.6–12.4)
Monocytes Absolute: 0.5 10*3/uL (ref 0.1–1.0)
NEUTROS ABS: 3.5 10*3/uL (ref 1.7–7.7)
NEUTROS PCT: 52 % (ref 43–77)
PLATELETS: 209 10*3/uL (ref 150–400)
RBC: 5.09 MIL/uL (ref 4.22–5.81)
RDW: 14.1 % (ref 11.5–15.5)
WBC: 6.7 10*3/uL (ref 4.0–10.5)

## 2016-02-03 LAB — BASIC METABOLIC PANEL WITH GFR
BUN: 13 mg/dL (ref 7–25)
CALCIUM: 9.2 mg/dL (ref 8.6–10.3)
CO2: 26 mmol/L (ref 20–31)
Chloride: 104 mmol/L (ref 98–110)
Creat: 1.06 mg/dL (ref 0.70–1.33)
GFR, EST NON AFRICAN AMERICAN: 79 mL/min (ref >=60)
GLUCOSE: 93 mg/dL (ref 65–99)
Potassium: 4 mmol/L (ref 3.5–5.3)
Sodium: 138 mmol/L (ref 135–146)

## 2016-02-03 LAB — LIPID PANEL
CHOL/HDL RATIO: 7.9 ratio — AB (ref <=5.0)
CHOLESTEROL: 205 mg/dL — AB (ref 125–200)
HDL: 26 mg/dL — ABNORMAL LOW (ref >=40)
LDL Cholesterol: 113 mg/dL (ref <130)
Triglycerides: 328 mg/dL — ABNORMAL HIGH (ref <150)
VLDL: 66 mg/dL — AB (ref <30)

## 2016-02-03 LAB — HEMOGLOBIN A1C
HEMOGLOBIN A1C: 6.1 % — AB (ref <5.7)
MEAN PLASMA GLUCOSE: 128 mg/dL

## 2016-02-03 LAB — HEPATIC FUNCTION PANEL
ALBUMIN: 4.4 g/dL (ref 3.6–5.1)
ALT: 21 U/L (ref 9–46)
AST: 21 U/L (ref 10–35)
Alkaline Phosphatase: 57 U/L (ref 40–115)
BILIRUBIN INDIRECT: 1.1 mg/dL (ref 0.2–1.2)
BILIRUBIN TOTAL: 1.3 mg/dL — AB (ref 0.2–1.2)
Bilirubin, Direct: 0.2 mg/dL (ref <=0.2)
TOTAL PROTEIN: 6.7 g/dL (ref 6.1–8.1)

## 2016-02-03 LAB — TSH: TSH: 2.09 m[IU]/L (ref 0.40–4.50)

## 2016-02-03 NOTE — Progress Notes (Signed)
Assessment and Plan:  Hypertension:  -Continue medication,  -monitor blood pressure at home.  -Continue DASH diet.   -Reminder to go to the ER if any CP, SOB, nausea, dizziness, severe HA, changes vision/speech, left arm numbness and tingling, and jaw pain.  Cholesterol: -Continue diet and exercise.  -Check cholesterol.   Pre-diabetes: -Continue diet and exercise.  -Check A1C  Vitamin D Def: -continue medications.   Continue diet and meds as discussed. Further disposition pending results of labs.  HPI 55 y.o. male  presents for 3 month follow up with hypertension, hyperlipidemia, prediabetes and vitamin D.   His blood pressure has been controlled at home, today their BP is BP: 118/80 mmHg.   He does not workout. He denies chest pain, shortness of breath, dizziness.   He is on cholesterol medication and denies myalgias. His cholesterol is not at goal. The cholesterol last visit was:   Lab Results  Component Value Date   CHOL 223* 10/21/2015   HDL 29* 10/21/2015   LDLCALC 158* 10/21/2015   TRIG 178* 10/21/2015   CHOLHDL 7.7* 10/21/2015     He has been working on diet and exercise for prediabetes, and denies foot ulcerations, hyperglycemia, hypoglycemia , increased appetite, nausea, paresthesia of the feet, polydipsia, polyuria, visual disturbances, vomiting and weight loss. Last A1C in the office was:  Lab Results  Component Value Date   HGBA1C 6.0* 10/21/2015    Patient is on Vitamin D supplement.  Lab Results  Component Value Date   VD25OH 35 10/21/2015     He reports that he has been very stressed out because his wife has been having a lot of spells of anger.  Most recently she had the police called on her for arguing with the neighbor.  Her husband reports that her mood swings are ranging from one end to the other for no rhyme or reason.  She has been very paranoid lately as well per his report.  She won't keep her windows open because she is afraid that people are  going to be watching her.  He reports that this has been going on for the last 3-4 years.  It has gotten a lot worse in the last few months.    Current Medications:  Current Outpatient Prescriptions on File Prior to Visit  Medication Sig Dispense Refill  . atorvastatin (LIPITOR) 80 MG tablet TAKE ONE TABLET BY MOUTH ONCE DAILY FOR CHOLESTEROL 90 tablet 1  . BABY ASPIRIN PO Take 81 mg by mouth daily.    . bisoprolol-hydrochlorothiazide (ZIAC) 5-6.25 MG tablet TAKE ONE TABLET BY MOUTH ONCE DAILY FOR BLOOD PRESSURE 90 tablet 0  . Cholecalciferol (VITAMIN D PO) Take 5,000 Int'l Units by mouth daily.    . Cyanocobalamin (VITAMIN B-12 PO) Take 1 tablet by mouth daily.    . Omega-3 Fatty Acids (FISH OIL PO) Take 1,200 mg by mouth daily.     No current facility-administered medications on file prior to visit.    Medical History:  Past Medical History  Diagnosis Date  . Hyperlipidemia   . Hypertension   . Vitamin D deficiency   . Prediabetes     Allergies: No Known Allergies   Review of Systems:  Review of Systems  Constitutional: Negative for fever, chills and malaise/fatigue.  HENT: Negative for congestion, ear pain and sore throat.   Eyes: Negative.   Respiratory: Negative for cough, shortness of breath and wheezing.   Cardiovascular: Negative for chest pain, palpitations and leg swelling.  Gastrointestinal:  Negative for heartburn, abdominal pain, diarrhea, constipation, blood in stool and melena.  Genitourinary: Negative.   Skin: Negative.   Neurological: Negative for dizziness, sensory change, loss of consciousness and headaches.  Psychiatric/Behavioral: Negative for depression. The patient is nervous/anxious and has insomnia.     Family history- Review and unchanged  Social history- Review and unchanged  Physical Exam: BP 118/80 mmHg  Pulse 62  Temp(Src) 98 F (36.7 C) (Temporal)  Resp 18  Ht 5' 10.25" (1.784 m)  Wt 254 lb (115.214 kg)  BMI 36.20 kg/m2 Wt Readings  from Last 3 Encounters:  02/03/16 254 lb (115.214 kg)  10/21/15 252 lb 12.8 oz (114.669 kg)  06/03/15 249 lb (112.946 kg)    General Appearance: Well nourished well developed, in no apparent distress. Eyes: PERRLA, EOMs, conjunctiva no swelling or erythema ENT/Mouth: Ear canals normal without obstruction, swelling, erythma, discharge.  TMs normal bilaterally.  Oropharynx moist, clear, without exudate, or postoropharyngeal swelling. Neck: Supple, thyroid normal,no cervical adenopathy  Respiratory: Respiratory effort normal, Breath sounds clear A&P without rhonchi, wheeze, or rale.  No retractions, no accessory usage. Cardio: RRR with no MRGs. Brisk peripheral pulses without edema.  Abdomen: Soft, + BS,  Non tender, no guarding, rebound, hernias, masses. Musculoskeletal: Full ROM, 5/5 strength, Normal gait Skin: Warm, dry without rashes, lesions, ecchymosis.  Neuro: Awake and oriented X 3, Cranial nerves intact. Normal muscle tone, no cerebellar symptoms. Psych: Normal affect, Insight and Judgment appropriate.    Terri Piedraourtney Forcucci, PA-C 9:07 AM [facility]

## 2016-03-01 ENCOUNTER — Other Ambulatory Visit: Payer: Self-pay | Admitting: Internal Medicine

## 2016-05-11 ENCOUNTER — Ambulatory Visit: Payer: Self-pay | Admitting: Internal Medicine

## 2016-05-18 ENCOUNTER — Encounter: Payer: Self-pay | Admitting: Internal Medicine

## 2016-05-18 ENCOUNTER — Ambulatory Visit (INDEPENDENT_AMBULATORY_CARE_PROVIDER_SITE_OTHER): Payer: BLUE CROSS/BLUE SHIELD | Admitting: Internal Medicine

## 2016-05-18 VITALS — BP 110/68 | HR 56 | Temp 98.0°F | Resp 18 | Ht 69.75 in | Wt 260.0 lb

## 2016-05-18 DIAGNOSIS — E559 Vitamin D deficiency, unspecified: Secondary | ICD-10-CM

## 2016-05-18 DIAGNOSIS — E782 Mixed hyperlipidemia: Secondary | ICD-10-CM

## 2016-05-18 DIAGNOSIS — R7303 Prediabetes: Secondary | ICD-10-CM | POA: Diagnosis not present

## 2016-05-18 DIAGNOSIS — G4733 Obstructive sleep apnea (adult) (pediatric): Secondary | ICD-10-CM | POA: Diagnosis not present

## 2016-05-18 DIAGNOSIS — I10 Essential (primary) hypertension: Secondary | ICD-10-CM

## 2016-05-18 DIAGNOSIS — Z79899 Other long term (current) drug therapy: Secondary | ICD-10-CM | POA: Diagnosis not present

## 2016-05-18 LAB — CBC WITH DIFFERENTIAL/PLATELET
BASOS PCT: 1 %
Basophils Absolute: 50 cells/uL (ref 0–200)
Eosinophils Absolute: 200 cells/uL (ref 15–500)
Eosinophils Relative: 4 %
HEMATOCRIT: 43 % (ref 38.5–50.0)
HEMOGLOBIN: 14.4 g/dL (ref 13.2–17.1)
LYMPHS ABS: 2000 {cells}/uL (ref 850–3900)
Lymphocytes Relative: 40 %
MCH: 28.1 pg (ref 27.0–33.0)
MCHC: 33.5 g/dL (ref 32.0–36.0)
MCV: 84 fL (ref 80.0–100.0)
MONO ABS: 500 {cells}/uL (ref 200–950)
MPV: 11.2 fL (ref 7.5–12.5)
Monocytes Relative: 10 %
Neutro Abs: 2250 cells/uL (ref 1500–7800)
Neutrophils Relative %: 45 %
Platelets: 186 10*3/uL (ref 140–400)
RBC: 5.12 MIL/uL (ref 4.20–5.80)
RDW: 13.9 % (ref 11.0–15.0)
WBC: 5 10*3/uL (ref 3.8–10.8)

## 2016-05-18 LAB — BASIC METABOLIC PANEL WITH GFR
BUN: 22 mg/dL (ref 7–25)
CO2: 27 mmol/L (ref 20–31)
Calcium: 8.9 mg/dL (ref 8.6–10.3)
Chloride: 105 mmol/L (ref 98–110)
Creat: 1.07 mg/dL (ref 0.70–1.33)
GFR, EST NON AFRICAN AMERICAN: 78 mL/min (ref >=60)
GLUCOSE: 108 mg/dL — AB (ref 65–99)
POTASSIUM: 3.9 mmol/L (ref 3.5–5.3)
Sodium: 140 mmol/L (ref 135–146)

## 2016-05-18 LAB — HEPATIC FUNCTION PANEL
ALK PHOS: 57 U/L (ref 40–115)
ALT: 23 U/L (ref 9–46)
AST: 22 U/L (ref 10–35)
Albumin: 4.3 g/dL (ref 3.6–5.1)
Bilirubin, Direct: 0.1 mg/dL (ref <=0.2)
Indirect Bilirubin: 1 mg/dL (ref 0.2–1.2)
TOTAL PROTEIN: 6.5 g/dL (ref 6.1–8.1)
Total Bilirubin: 1.1 mg/dL (ref 0.2–1.2)

## 2016-05-18 LAB — LIPID PANEL
Cholesterol: 214 mg/dL — ABNORMAL HIGH (ref 125–200)
HDL: 27 mg/dL — AB (ref >=40)
LDL CALC: 146 mg/dL — AB (ref <130)
Total CHOL/HDL Ratio: 7.9 Ratio — ABNORMAL HIGH (ref <=5.0)
Triglycerides: 207 mg/dL — ABNORMAL HIGH (ref <150)
VLDL: 41 mg/dL — ABNORMAL HIGH (ref <30)

## 2016-05-18 LAB — TSH: TSH: 1.65 mIU/L (ref 0.40–4.50)

## 2016-05-18 LAB — HEMOGLOBIN A1C
Hgb A1c MFr Bld: 6.1 % — ABNORMAL HIGH (ref <5.7)
Mean Plasma Glucose: 128 mg/dL

## 2016-05-18 MED ORDER — BISOPROLOL-HYDROCHLOROTHIAZIDE 5-6.25 MG PO TABS: ORAL_TABLET | ORAL | Status: DC

## 2016-05-18 MED ORDER — ATORVASTATIN CALCIUM 80 MG PO TABS: 80.0000 mg | ORAL_TABLET | Freq: Every day | ORAL | Status: DC

## 2016-05-18 NOTE — Progress Notes (Signed)
Assessment and Plan:  Hypertension:  -Continue medication,  -monitor blood pressure at home.  -Continue DASH diet.   -Reminder to go to the ER if any CP, SOB, nausea, dizziness, severe HA, changes vision/speech, left arm numbness and tingling, and jaw pain.  Cholesterol: -Continue diet and exercise.  -Check cholesterol.   Pre-diabetes: -Continue diet and exercise.  -Check A1C  Vitamin D Def: -check level -continue medications.   [facility] -reordered CPAP machine -previous sleep study has been done in the past couple years per patient report.   Continue diet and meds as discussed. Further disposition pending results of labs.  HPI 55 y.o. male  presents for 3 month follow up with hypertension, hyperlipidemia, prediabetes and vitamin D.   His blood pressure has been controlled at home, today their BP is BP: 110/68 mmHg.   He does workout. He denies chest pain, shortness of breath, dizziness.   He is on cholesterol medication and denies myalgias. His cholesterol is not at goal. The cholesterol last visit was:   Lab Results  Component Value Date   CHOL 205* 02/03/2016   HDL 26* 02/03/2016   LDLCALC 113 02/03/2016   TRIG 328* 02/03/2016   CHOLHDL 7.9* 02/03/2016     He has been working on diet and exercise for prediabetes, and denies foot ulcerations, hyperglycemia, hypoglycemia , increased appetite, nausea, paresthesia of the feet, polydipsia, polyuria, visual disturbances, vomiting and weight loss. Last A1C in the office was:  Lab Results  Component Value Date   HGBA1C 6.1* 02/03/2016    Patient is on Vitamin D supplement.  Lab Results  Component Value Date   VD25OH 6854 10/21/2015     He does need to have a new mask for his CPAP machine.  He was on autotitration.  He had to stop using his machine because he lost his insurance for a while and then he could not afford a new mask when his broke.  He would like to restart using the machine.    Current Medications:  Current  Outpatient Prescriptions on File Prior to Visit  Medication Sig Dispense Refill  . atorvastatin (LIPITOR) 80 MG tablet TAKE ONE TABLET BY MOUTH ONCE DAILY FOR CHOLESTEROL 90 tablet 0  . BABY ASPIRIN PO Take 81 mg by mouth daily.    . bisoprolol-hydrochlorothiazide (ZIAC) 5-6.25 MG tablet TAKE ONE TABLET BY MOUTH ONCE DAILY FOR BLOOD PRESSURE 90 tablet 0  . Cholecalciferol (VITAMIN D PO) Take 5,000 Int'l Units by mouth daily.    . Cyanocobalamin (VITAMIN B-12 PO) Take 1 tablet by mouth daily.    . Omega-3 Fatty Acids (FISH OIL PO) Take 1,200 mg by mouth daily.     No current facility-administered medications on file prior to visit.    Medical History:  Past Medical History  Diagnosis Date  . Hyperlipidemia   . Hypertension   . Vitamin D deficiency   . Prediabetes     Allergies: No Known Allergies   Review of Systems:  Review of Systems  Constitutional: Negative for fever, chills and malaise/fatigue.  HENT: Negative for congestion, ear pain and sore throat.   Eyes: Negative.   Respiratory: Negative for cough, shortness of breath and wheezing.   Cardiovascular: Negative for chest pain, palpitations and leg swelling.  Gastrointestinal: Negative for heartburn, abdominal pain, diarrhea, constipation, blood in stool and melena.  Genitourinary: Negative.   Skin: Negative.   Neurological: Negative for dizziness, sensory change, loss of consciousness and headaches.  Psychiatric/Behavioral: Negative for depression. The patient  is not nervous/anxious and does not have insomnia.     Family history- Review and unchanged  Social history- Review and unchanged  Physical Exam: BP 110/68 mmHg  Pulse 56  Temp(Src) 98 F (36.7 C) (Temporal)  Resp 18  Ht 5' 9.75" (1.772 m)  Wt 260 lb (117.935 kg)  BMI 37.56 kg/m2 Wt Readings from Last 3 Encounters:  05/18/16 260 lb (117.935 kg)  02/03/16 254 lb (115.214 kg)  10/21/15 252 lb 12.8 oz (114.669 kg)    General Appearance: Well nourished  well developed, in no apparent distress. Eyes: PERRLA, EOMs, conjunctiva no swelling or erythema ENT/Mouth: Ear canals normal without obstruction, swelling, erythma, discharge.  TMs normal bilaterally.  Oropharynx moist, clear, without exudate, or postoropharyngeal swelling. Neck: Supple, thyroid normal,no cervical adenopathy  Respiratory: Respiratory effort normal, Breath sounds clear A&P without rhonchi, wheeze, or rale.  No retractions, no accessory usage. Cardio: RRR with no MRGs. Brisk peripheral pulses without edema.  Abdomen: Soft, + BS,  Non tender, no guarding, rebound, hernias, masses. Musculoskeletal: Full ROM, 5/5 strength, Normal gait Skin: Warm, dry without rashes, lesions, ecchymosis.  Neuro: Awake and oriented X 3, Cranial nerves intact. Normal muscle tone, no cerebellar symptoms. Psych: Normal affect, Insight and Judgment appropriate.    Terri Piedra, PA-C 9:26 AM [facility]

## 2016-05-20 ENCOUNTER — Other Ambulatory Visit: Payer: Self-pay | Admitting: Internal Medicine

## 2016-05-20 ENCOUNTER — Telehealth: Payer: Self-pay

## 2016-05-20 DIAGNOSIS — G4733 Obstructive sleep apnea (adult) (pediatric): Secondary | ICD-10-CM

## 2016-05-20 MED ORDER — EZETIMIBE 10 MG PO TABS: 10.0000 mg | ORAL_TABLET | Freq: Every day | ORAL | Status: DC

## 2016-05-20 NOTE — Telephone Encounter (Signed)
-----   Message from Terri Piedraourtney Forcucci, [place]-C sent at 05/20/2016 12:46 PM EDT ----- Regarding: RE: med concern It is safe to take zetia and lipitor together.  Think of zetia like a booster for the lipitor.   Toni Amendourtney ----- Message -----    From: Gregery NaAngela D Brighten Orndoff, CMA    Sent: 05/19/2016   4:57 PM      To: Terri Piedraourtney Forcucci, PA-C Subject: med concern                                    Pt states he is on lipitor & wants to know if he has to take both the lipitor & Zetia together.

## 2016-05-20 NOTE — Telephone Encounter (Signed)
Spoke with pt about taking his zetia & lipitor, per Toni AmendCourtney it is ok for him to take these together. Pt voiced understanding & agreed to the med change.

## 2016-06-08 ENCOUNTER — Ambulatory Visit (INDEPENDENT_AMBULATORY_CARE_PROVIDER_SITE_OTHER): Payer: BLUE CROSS/BLUE SHIELD | Admitting: Neurology

## 2016-06-08 ENCOUNTER — Encounter: Payer: Self-pay | Admitting: Neurology

## 2016-06-08 VITALS — BP 140/80 | HR 66 | Resp 20 | Ht 70.5 in | Wt 261.0 lb

## 2016-06-08 DIAGNOSIS — G4733 Obstructive sleep apnea (adult) (pediatric): Secondary | ICD-10-CM

## 2016-06-08 DIAGNOSIS — R0683 Snoring: Secondary | ICD-10-CM

## 2016-06-08 DIAGNOSIS — Z9989 Dependence on other enabling machines and devices: Principal | ICD-10-CM

## 2016-06-08 NOTE — Progress Notes (Signed)
SLEEP MEDICINE CLINIC   Provider:  Melvyn Novas, M D  Referring Provider: Lucky Cowboy, MD Primary Care Physician:  Nadean Corwin, MD  Chief Complaint  Patient presents with  . New Patient (Initial Visit)    on cpap for more than 5 years, mask broken, uses [facility], don't have a copy of sleep study    HPI:  Larry Hudson is a 55 y.o. male , seen here as a referral from Dr. Oneta Rack for Sleep consultation.  Mr. Ziemann presents as a compliance CPAP user in urgent need of new supplies for his CPAP.  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 His work however is physically demanding as he unloads and loads his truck. He transfers laundry from medical facilities. The packages he has to lift are very heavy. About 5 years ago was the last time he got a new mask is no longer working of course. [facility] [address] [facility] sleep study a little more than 5 years ago, there has been no follow-up from that institution. I also have a overnight pulse oximetry available a report dated 08/23/2009 and the patient was tilted the proximal oximetry was not concerning for sleep apnea. I do not have a copy of his original sleep study which happily followed. I do not know to what degree of apnea was found that CPAP was initiated. The patient does not know the settings and also his machine is still working he needs urgently new supplies as mentioned.  His wife had noted prior to his sleep evaluation that he stopped breathing at night and she was very bothered by his loud snoring. Everybody in the house heard him snore he states. This changed when CPAP was initiated. I have a download available here just for 1 night so that we can see what the machine is set at, it is set at 18 cm water with 3 cm EPR, which is a very high pressure. His AHI was 2.3. Since he is  comfortable using them machine at this pressure I will not need to change it at his CPAP machine works well as long as he has supplies. At this time there is no need for new machine, since he paid this one off.   Chief complaint according to patient : I don't sleep as well with the broken mask .   Sleep habits are as follows: He will go to bed at about 11 PM, usually asleep very promptly. He is always asleep by 12 midnight he states. Sometimes he will go to the bathroom once and usually falls asleep again. He usually doesn't have any further bathroom breaks. His wife has to wake him at 6:30 he is usually not spontaneously awake at that time he will drive usually between the hours of 8 AM through 7 PM. He works 4 days a week. He describes his bedroom is cool, quiet and dark. He turns a lot at night, he can usually not stay asleep in supine position. He usually likes to sleep on 2 pillows some nights on 1. He has been a habitual mouth breather for most of his life and is chronically congested. He had a septoplasty / turbinate reduction, it did not work.   Sleep medical history and family sleep history:  No family history.  Social  history:  Used to drink more alcohol, but the last 4 years he lost weight by cutting it out.  Used to drink sodas, too. Not anymore.  Crystal light 3 a day.  Caffeine intake -  44 ounces of coffee, no longer drinks hot tea, no Sodas.  Smoker, none-  Quit over 12 years ago.   Review of Systems: Out of a complete 14 system review, the patient complains of only the following symptoms, and all other reviewed systems are negative. Epworth score 6 , Fatigue severity score 39  , depression score    Social History   Social History  . Marital status: Married    Spouse name: N/A  . Number of children: N/A  . Years of education: N/A   Occupational History  . Not on file.   Social History Main Topics  . Smoking status: Former Smoker    Quit date: 02/03/1996  . Smokeless  tobacco: Never Used  . Alcohol use No  . Drug use: No  . Sexual activity: Not on file   Other Topics Concern  . Not on file   Social History Narrative  . No narrative on file    Family History  Problem Relation Age of Onset  . Hyperlipidemia Mother   . Heart attack Mother   . Heart disease Father   . Emphysema Father     Past Medical History:  Diagnosis Date  . Hyperlipidemia   . Hypertension   . Prediabetes   . Vitamin D deficiency     Past Surgical History:  Procedure Laterality Date  . HEMORRHOID BANDING  2013  . NASAL SEPTUM SURGERY  2004    Current Outpatient Prescriptions  Medication Sig Dispense Refill  . atorvastatin (LIPITOR) 80 MG tablet Take 1 tablet (80 mg total) by mouth daily at 6 PM. 90 tablet 1  . BABY ASPIRIN PO Take 81 mg by mouth daily.    . bisoprolol-hydrochlorothiazide (ZIAC) 5-6.25 MG tablet TAKE ONE TABLET BY MOUTH ONCE DAILY FOR BLOOD PRESSURE 90 tablet 0  . Cholecalciferol (VITAMIN D PO) Take 5,000 Int'l Units by mouth daily.    . Cyanocobalamin (VITAMIN B-12 PO) Take 1 tablet by mouth daily.    . Omega-3 Fatty Acids (FISH OIL PO) Take 1,200 mg by mouth daily.    Marland Kitchen ezetimibe (ZETIA) 10 MG tablet Take 1 tablet (10 mg total) by mouth daily. (Patient not taking: Reported on 06/08/2016) 30 tablet 11   No current facility-administered medications for this visit.     Allergies as of 06/08/2016  . (No Known Allergies)    Vitals: BP 140/80   Pulse 66   Resp 20   Ht 5' 10.5" (1.791 m)   Wt 261 lb (118.4 kg)   BMI 36.92 kg/m  Last Weight:  Wt Readings from Last 1 Encounters:  06/08/16 261 lb (118.4 kg)   UXL:KGMW mass index is 36.92 kg/m.     Last Height:   Ht Readings from Last 1 Encounters:  06/08/16 5' 10.5" (1.791 m)    Physical exam:  General: The patient is awake, alert and appears not in acute distress. The patient is well groomed. Head: Normocephalic, atraumatic. Neck is supple. Mallampati status post UPPP  neck  circumference: 20. Nasal airflow congested ,  Retrognathia is not seen.  Cardiovascular:  Regular rate and rhythm , without  murmurs or carotid bruit, and without distended neck veins. Respiratory: Lungs are clear to auscultation. Skin:  Without evidence of edema, or rash Trunk:  BMI is elevated . The patient's posture is erect.   Neurologic exam : The patient is awake and alert, oriented to place and time.   Memory subjective described as intact.  Attention span & concentration ability appears normal.  Speech is fluent,  without dysarthria, dysphonia or aphasia.  Mood and affect are appropriate.  Cranial nerves: Pupils are equal and briskly reactive to light. Funduscopic exam without evidence of pallor or edema.  Extraocular movements  in vertical and horizontal planes intact and without nystagmus. Visual fields by finger perimetry are intact. Hearing to finger rub intact.   Facial sensation intact to fine touch.  Facial motor strength is symmetric and tongue and uvula move midline. Shoulder shrug was symmetrical.   Motor exam:   Normal tone, muscle bulk and symmetric strength in all extremities.  Sensory:  Fine touch, pinprick and vibration were normal.  Coordination: Rapid alternating movements in the fingers/hands was normal.  Finger-to-nose maneuver  normal without evidence of ataxia, dysmetria or tremor.  Gait and station: Patient walks without assistive device and is able unassisted to climb up to the exam table. Strength within normal limits.  Stance is stable and normal. Tandem gait is unfragmented. Deep tendon reflexes: in the upper and lower extremities are symmetric and intact. Babinski maneuver response is downgoing.  The patient was advised of the nature of the diagnosed sleep disorder , the treatment options and risks for general a health and wellness arising from not treating the condition.  I spent more than 45 minutes of face to face time with the patient. Greater than  50% of time was spent in counseling and coordination of care. We have discussed the diagnosis and differential and I answered the patient's questions.     Assessment:  After physical and neurologic examination, review of laboratory studies,  Personal review of imaging studies, reports of other /same  Imaging studies ,  Results of polysomnography/ neurophysiology testing and pre-existing records as far as provided in visit., my assessment is   1) I the pleasure of seeing Mr. Manfre today, we'll has been successfully treated for obstructive sleep apnea with CPAP for over 5 years. I was also able to see a pulse oximetry from before he was placed on CPAP. I do not have access at this time to his original sleep study. Mr. she Richarda Osmond needs urgently new supplies including tubing, filter headgear and mask. He prefers a full face mask. Since he was last prescribed in interface the choice of models have greatly improved. He likes to stay for now on his current full face mask, mirage quattro, wide.   2) the patient has undergone a turbinate reduction over 5 years ago, this was not UPPP procedure. His current upper airway is still very narrow the uvula lifts barely above the tongue ground.  I have no doubt that the patient still has sleep apnea given that he feels much better when he can use CPAP and I will allow him to get his supplies on an urgent basis. I would like to send him to either advanced home care - [facility].      Plan:  Treatment plan and additional workup :  See every year, at this time the patient has to re-establish compliance. He needs a new mask to become compliant however he just received a new me rash Quattro mask I will ask him to use his machine every night for the next 50 or 60 days and from thereon he should be on a  regular supply list.    Melvyn Novas MD  06/08/2016   CC: Lucky Cowboy, Md [address] [place], [place] [address]

## 2016-06-08 NOTE — Patient Instructions (Signed)
CPAP CPAP and BIPAP Information CPAP and BIPAP are methods of helping you breathe with the use of air pressure. CPAP stands for "continuous positive airway pressure." BIPAP stands for "bi-level positive airway pressure." In both methods, air is blown into your air passages to help keep you breathing well. With CPAP, the amount of pressure stays the same while you breathe in and out. CPAP is most commonly used for obstructive sleep apnea. For obstructive sleep apnea, CPAP works by holding your airways open so that they do not collapse when your muscles relax during sleep. BIPAP is similar to CPAP except the amount of pressure is increased when you inhale. This helps you take larger breaths. Your health care provider will recommend whether CPAP or BIPAP would be more helpful for you.  WHY ARE CPAP AND BIPAP TREATMENTS USED? CPAP or BIPAP can be helpful if you have:   Sleep apnea.   Chronic obstructive pulmonary disease (COPD).   Diseases that weaken the muscles of the chest, including muscular dystrophy or neurological diseases such as amyotrophic lateral sclerosis (ALS).  Other problems that cause breathing to be weak, abnormal, or difficult.  HOW IS CPAP OR BIPAP ADMINISTERED? Both CPAP and BIPAP are provided by a small machine with a flexible plastic tube that attaches to a plastic mask. The mask fits on your face, and air is blown into your air passages through your nose or mouth. The amount of pressure that is used to blow the air into your air passages can be set on the machine. Your health care provider will determine the pressure setting that should be used based on your individual needs. WHEN SHOULD CPAP OR BIPAP BE USED? In most cases, the mask is worn only when sleeping. Generally, you will need to wear the mask throughout the night and during the daytime if you take a nap. In a few cases involving certain medical conditions, people also need to wear the mask at other times when they are  awake. Follow your health care provider's instructions for when to use the machine.  USING THE MASK  Because the mask needs to be snug, some people feel a trapped or closed-in feeling (claustrophobic) when first using the mask. You may need to get used to the mask gradually. To do this, you can first hold the mask loosely over your nose or mouth. Gradually apply the mask more snugly. You can also gradually increase the amount of time that you use the mask.  Masks are available in various types and sizes. Some fit over your mouth and nose, and some fit over just your nose. If your mask does not fit well, talk to your health care provider about getting a different one.  If you are using a nasal mask and you tend to breathe through your mouth, a chin strap may be applied to help keep your mouth closed.   The CPAP and BIPAP machines have alarms that may sound if the mask comes off or develops a leak.  If you have trouble with the mask, it is very important that you talk to your health care provider about finding a way to make the mask easier to tolerate. Do not stop using the mask. This could have a negative impact on your health. TIPS FOR USING THE MACHINE  Place your CPAP or BIPAP machine on a secure table or stand near an electrical outlet.   Know where the on-off switch is located on the machine.  Follow your health care  provider's instructions for how to set the pressure on your machine and when you should use it.  Do not eat or drink while the CPAP or BIPAP machine is on. Food or fluids could get pushed into your lungs by the pressure of the CPAP or BIPAP.  Do not smoke. Tobacco smoke residue can damage the machine.   For home use, CPAP and BIPAP machines can be rented or purchased through home health care companies. Many different brands of machines are available. Renting a machine before purchasing may help you find out which particular machine works well for you. SEEK IMMEDIATE  MEDICAL CARE IF:  You have redness or open areas around your nose or mouth where the mask fits.   You have trouble operating the CPAP or BIPAP machine.   You cannot tolerate wearing the CPAP or BIPAP mask.    This information is not intended to replace advice given to you by your health care provider. Make sure you discuss any questions you have with your health care provider.   Document Released: 07/24/2004 Document Revised: 11/16/2014 Document Reviewed: 05/25/2013 Elsevier Interactive Patient Education [employer]

## 2016-09-07 ENCOUNTER — Ambulatory Visit: Payer: BLUE CROSS/BLUE SHIELD | Admitting: Adult Health

## 2016-09-08 ENCOUNTER — Encounter: Payer: Self-pay | Admitting: Adult Health

## 2016-10-21 ENCOUNTER — Encounter: Payer: Self-pay | Admitting: Physician Assistant

## 2016-12-28 ENCOUNTER — Encounter: Payer: Self-pay | Admitting: Physician Assistant

## 2016-12-28 ENCOUNTER — Other Ambulatory Visit: Payer: Self-pay

## 2016-12-28 ENCOUNTER — Ambulatory Visit (INDEPENDENT_AMBULATORY_CARE_PROVIDER_SITE_OTHER): Payer: BLUE CROSS/BLUE SHIELD | Admitting: Physician Assistant

## 2016-12-28 VITALS — BP 136/76 | HR 87 | Temp 97.7°F | Resp 16 | Ht 70.0 in | Wt 273.6 lb

## 2016-12-28 DIAGNOSIS — Z79899 Other long term (current) drug therapy: Secondary | ICD-10-CM | POA: Diagnosis not present

## 2016-12-28 DIAGNOSIS — Z0001 Encounter for general adult medical examination with abnormal findings: Secondary | ICD-10-CM

## 2016-12-28 DIAGNOSIS — I10 Essential (primary) hypertension: Secondary | ICD-10-CM

## 2016-12-28 DIAGNOSIS — Z136 Encounter for screening for cardiovascular disorders: Secondary | ICD-10-CM | POA: Diagnosis not present

## 2016-12-28 DIAGNOSIS — G4733 Obstructive sleep apnea (adult) (pediatric): Secondary | ICD-10-CM

## 2016-12-28 DIAGNOSIS — Z0000 Encounter for general adult medical examination without abnormal findings: Secondary | ICD-10-CM | POA: Diagnosis not present

## 2016-12-28 DIAGNOSIS — E782 Mixed hyperlipidemia: Secondary | ICD-10-CM

## 2016-12-28 DIAGNOSIS — Z125 Encounter for screening for malignant neoplasm of prostate: Secondary | ICD-10-CM | POA: Diagnosis not present

## 2016-12-28 DIAGNOSIS — E559 Vitamin D deficiency, unspecified: Secondary | ICD-10-CM

## 2016-12-28 DIAGNOSIS — R7303 Prediabetes: Secondary | ICD-10-CM

## 2016-12-28 MED ORDER — BISOPROLOL-HYDROCHLOROTHIAZIDE 5-6.25 MG PO TABS: ORAL_TABLET | ORAL | 0 refills | Status: DC

## 2016-12-28 MED ORDER — EZETIMIBE 10 MG PO TABS: 10.0000 mg | ORAL_TABLET | Freq: Every day | ORAL | 1 refills | Status: DC

## 2016-12-28 MED ORDER — ATORVASTATIN CALCIUM 80 MG PO TABS: 80.0000 mg | ORAL_TABLET | Freq: Every day | ORAL | 1 refills | Status: DC

## 2016-12-28 NOTE — Progress Notes (Signed)
Complete Physical  Assessment and Plan:  Essential hypertension - continue medications, DASH diet, exercise and monitor at home. Call if greater than 130/80.  -     CBC with Differential/Platelet -     BASIC METABOLIC PANEL WITH GFR -     Hepatic function panel -     TSH -     Urinalysis, Routine w reflex microscopic -     Microalbumin / creatinine urine ratio -     EKG 12-Lead -     bisoprolol-hydrochlorothiazide (ZIAC) 5-6.25 MG tablet; TAKE ONE TABLET BY MOUTH ONCE DAILY FOR BLOOD PRESSURE  Hyperlipidemia -continue medications, check lipids, decrease fatty foods, increase activity.  -     Lipid panel -     ezetimibe (ZETIA) 10 MG tablet; Take 1 tablet (10 mg total) by mouth daily.  OSA (obstructive sleep apnea) Sleep apnea- continue CPAP, weight loss advised.   Prediabetes Discussed general issues about diabetes pathophysiology and management., Educational material distributed., Suggested low cholesterol diet., Encouraged aerobic exercise., Discussed foot care., Reminded to get yearly retinal exam. -     Hemoglobin A1c  Vitamin D deficiency -     VITAMIN D 25 Hydroxy (Vit-D Deficiency, Fractures)  Medication management -     Magnesium  Encounter for general adult medical examination with abnormal findings  Morbid Obesity with co morbidities - long discussion about weight loss, diet, and exercise  Prostate cancer screening -     PSA   Discussed med's effects and SE's. Screening labs and tests as requested with regular follow-up as recommended. Over 40 minutes of exam, counseling, chart review and critical decision making was performed Future Appointments Date Time Provider Department Center  12/28/2017 10:00 AM Quentin MullingAmanda Avaya Mcjunkins, PA-C [facility] None    HPI Patient presents for a complete physical.   His blood pressure has been controlled at home, today their BP is BP: 136/76 He does not workout but states that his current job is very physical He denies chest pain,  shortness of breath, dizziness.  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  He is on cholesterol medication and denies myalgias. His cholesterol is not at goal. The cholesterol last visit was:   Lab Results  Component Value Date   CHOL 214 (H) 05/18/2016   HDL 27 (L) 05/18/2016   LDLCALC 146 (H) 05/18/2016   TRIG 207 (H) 05/18/2016   CHOLHDL 7.9 (H) 05/18/2016   He has been working on diet and exercise for prediabetes,and denies paresthesia of the feet, polydipsia, polyuria and visual disturbances. Last A1C in the office was:  Lab Results  Component Value Date   HGBA1C 6.1 (H) 05/18/2016    Patient is on Vitamin D supplement.   Lab Results  Component Value Date   VD25OH 54 10/21/2015     Last PSA was: Lab Results  Component Value Date   PSA 1.72 10/21/2015   BMI is Body mass index is 39.26 kg/m., he is working on diet and exercise. He is on CPAP for OSA and using daily.  Wt Readings from Last 3 Encounters:  12/28/16 273 lb 9.6 oz (124.1 kg)  06/08/16 261 lb (118.4 kg)  05/18/16 260 lb (117.9 kg)     Current Medications:  Current Outpatient Prescriptions on File Prior to Visit  Medication Sig Dispense Refill  . BABY ASPIRIN PO Take 81 mg by mouth daily.    . bisoprolol-hydrochlorothiazide (ZIAC) 5-6.25 MG tablet TAKE ONE TABLET BY MOUTH ONCE DAILY FOR BLOOD PRESSURE 90 tablet 0  .  Cholecalciferol (VITAMIN D PO) Take 5,000 Int'l Units by mouth daily.    . Cyanocobalamin (VITAMIN B-12 PO) Take 1 tablet by mouth daily.    Marland Kitchen ezetimibe (ZETIA) 10 MG tablet Take 1 tablet (10 mg total) by mouth daily. 30 tablet 11  . Omega-3 Fatty Acids (FISH OIL PO) Take 1,200 mg by mouth daily.     No current facility-administered medications on file prior to visit.    Allergies:  No Known Allergies Health Maintenance:  Immunization History  Administered Date(s) Administered  . Influenza Split 10/08/2014  . Influenza-Unspecified 08/07/2015  . PPD Test 10/08/2014, 10/21/2015  .  Tdap 08/14/2009   Tetanus: 2010 Pneumovax: N/A Prevnar 13: N/A Flu vaccine: 2017 Zostavax: N/A  DEXA: N/A Colonoscopy: 11/2011 10 year EGD: N/A [place] Exam: [place] Dentist: none  Patient Care Team: Lucky Cowboy, MD as PCP - General (Internal Medicine) Sharrell Ku, MD as Consulting Physician (Gastroenterology)  Medical History:  has Essential hypertension; Hyperlipidemia; Vitamin D deficiency; Prediabetes; Medication management; Morbid obesity BMI (35.49) ; OSA on CPAP; BMI 35.0-35.9,adult; Screening for rectal cancer; and Prostate cancer screening on his problem list.   Surgical History:  He  has a past surgical history that includes Hemorrhoid banding (2013) and Nasal septum surgery (2004).   Family History:  His family history includes Emphysema in his father; Heart attack in his mother; Heart disease in his father; Hyperlipidemia in his mother.   Social History:   reports that he quit smoking about 20 years ago. He has never used smokeless tobacco. He reports that he does not drink alcohol or use drugs.  Review of Systems:  Review of Systems  Constitutional: Negative.   HENT: Negative.   Eyes: Negative.   Respiratory: Negative.   Cardiovascular: Negative.   Gastrointestinal: Negative.   Genitourinary: Negative.   Musculoskeletal: Negative.   Skin: Negative.     Physical Exam: Estimated body mass index is 39.26 kg/m as calculated from the following:   Height as of this encounter: 5\' 10"  (1.778 m).   Weight as of this encounter: 273 lb 9.6 oz (124.1 kg). BP 136/76   Pulse 87   Temp 97.7 F (36.5 C)   Resp 16   Ht 5\' 10"  (1.778 m)   Wt 273 lb 9.6 oz (124.1 kg)   SpO2 97%   BMI 39.26 kg/m  General Appearance: Well nourished, in no apparent distress.  Eyes: PERRLA, EOMs, conjunctiva no swelling or erythema, normal fundi and vessels.  Sinuses: No Frontal/maxillary tenderness  ENT/Mouth: Ext aud canals clear, normal light reflex with TMs without  erythema, bulging. Good dentition. No erythema, swelling, or exudate on post pharynx. Tonsils not swollen or erythematous. Hearing normal.  Neck: Supple, thyroid normal. No bruits  Respiratory: Respiratory effort normal, BS equal bilaterally without rales, rhonchi, wheezing or stridor.  Cardio: RRR without murmurs, rubs or gallops. Brisk peripheral pulses without edema.  Chest: symmetric, with normal excursions and percussion.  Abdomen: Soft, nontender, no guarding, rebound, hernias, masses, or organomegaly.  Lymphatics: Non tender without lymphadenopathy.  Genitourinary:  Musculoskeletal: Full ROM all peripheral extremities,5/5 strength, and normal gait.  Skin: Warm, dry without rashes, lesions, ecchymosis. Neuro: Cranial nerves intact, reflexes equal bilaterally. Normal muscle tone, no cerebellar symptoms. Sensation intact.  Psych: Awake and oriented X 3, normal affect, Insight and Judgment appropriate.   EKG: WNL no changes. AORTA SCAN: WNL  Quentin Mulling 10:28 AM [facility]

## 2016-12-28 NOTE — Patient Instructions (Signed)
Simple math prevails.    1st - exercise does not produce significant weight loss - at best one converts fat into muscle , "bulks up", loses inches, but usually stays "weight neutral"     2nd - think of your body weightas a check book: If you eat more calories than you burn up - you save money or gain weight .... Or if you spend more money than you put in the check book, ie burn up more calories than you eat, then you lose weight     3rd - if you walk or run 1 mile, you burn up 100 calories - you have to burn up 3,500 calories to lose 1 pound, ie you have to walk/run 35 miles to lose 1 measly pound. So if you want to lose 10 #, then you have to walk/run 350 miles, so.... clearly exercise is not the solution.     4. So if you consume 1,500 calories, then you have to burn up the equivalent of 15 miles to stay weight neutral - It also stands to reason that if you consume 1,500 cal/day and don't lose weight, then you must be burning up about 1,500 cals/day to stay weight neutral.     5. If you really want to lose weight, you must cut your calorie intake 300 calories /day and at that rate you should lose about 1 # every 3 days.   6. Please purchase Dr Joel Fuhrman's book(s) "The End of Dieting" & "Eat to Live" . It has some great concepts and recipes.      We want weight loss that will last so you should lose 1-2 pounds a week.  THAT IS IT! Please pick THREE things a month to change. Once it is a habit check off the item. Then pick another three items off the list to become habits.  If you are already doing a habit on the list GREAT!  Cross that item off! o Don't drink your calories. Ie, alcohol, soda, fruit juice, and sweet tea.  o Drink more water. Drink a glass when you feel hungry or before each meal.  o Eat breakfast - Complex carb and protein (likeDannon light and fit yogurt, oatmeal, fruit, eggs, turkey bacon). o Measure your cereal.  Eat no more than one cup a day. (ie Kashi) o Eat an apple  a day. o Add a vegetable a day. o Try a new vegetable a month. o Use Pam! Stop using oil or butter to cook. o Don't finish your plate or use smaller plates. o Share your dessert. o Eat sugar free Jello for dessert or frozen grapes. o Don't eat 2-3 hours before bed. o Switch to whole wheat bread, pasta, and brown rice. o Make healthier choices when you eat out. No fries! o Pick baked chicken, NOT fried. o Don't forget to SLOW DOWN when you eat. It is not going anywhere.  o Take the stairs. o Park far away in the parking lot o Lift soup cans (or weights) for 10 minutes while watching TV. o Walk at work for 10 minutes during break. o Walk outside 1 time a week with your friend, kids, dog, or significant other. o Start a walking group at church. o Walk the mall as much as you can tolerate.  o Keep a food diary. o Weigh yourself daily. o Walk for 15 minutes 3 days per week. o Cook at home more often and eat out less.  If life happens and you   go back to old habits, it is okay.  Just start over. You can do it!   If you experience chest pain, get short of breath, or tired during the exercise, please stop immediately and inform your doctor.   

## 2016-12-29 LAB — LIPID PANEL
Cholesterol: 192 mg/dL (ref <200)
HDL: 29 mg/dL — ABNORMAL LOW (ref >40)
LDL Cholesterol: 123 mg/dL — ABNORMAL HIGH (ref <100)
Total CHOL/HDL Ratio: 6.6 Ratio — ABNORMAL HIGH (ref <5.0)
Triglycerides: 199 mg/dL — ABNORMAL HIGH (ref <150)
VLDL: 40 mg/dL — ABNORMAL HIGH (ref <30)

## 2016-12-29 LAB — CBC WITH DIFFERENTIAL/PLATELET
BASOS ABS: 64 {cells}/uL (ref 0–200)
BASOS PCT: 1 %
EOS ABS: 128 {cells}/uL (ref 15–500)
Eosinophils Relative: 2 %
HEMATOCRIT: 45.6 % (ref 38.5–50.0)
HEMOGLOBIN: 15.1 g/dL (ref 13.2–17.1)
Lymphocytes Relative: 34 %
Lymphs Abs: 2176 cells/uL (ref 850–3900)
MCH: 28.3 pg (ref 27.0–33.0)
MCHC: 33.1 g/dL (ref 32.0–36.0)
MCV: 85.4 fL (ref 80.0–100.0)
MONO ABS: 512 {cells}/uL (ref 200–950)
MPV: 11.2 fL (ref 7.5–12.5)
Monocytes Relative: 8 %
NEUTROS ABS: 3520 {cells}/uL (ref 1500–7800)
Neutrophils Relative %: 55 %
Platelets: 236 10*3/uL (ref 140–400)
RBC: 5.34 MIL/uL (ref 4.20–5.80)
RDW: 14.1 % (ref 11.0–15.0)
WBC: 6.4 10*3/uL (ref 3.8–10.8)

## 2016-12-29 LAB — HEMOGLOBIN A1C
Hgb A1c MFr Bld: 6.3 % — ABNORMAL HIGH (ref <5.7)
MEAN PLASMA GLUCOSE: 134 mg/dL

## 2016-12-29 LAB — BASIC METABOLIC PANEL WITH GFR
BUN: 12 mg/dL (ref 7–25)
CO2: 28 mmol/L (ref 20–31)
Calcium: 9.6 mg/dL (ref 8.6–10.3)
Chloride: 104 mmol/L (ref 98–110)
Creat: 1.02 mg/dL (ref 0.70–1.33)
GFR, Est African American: >89 mL/min (ref >=60)
GFR, Est Non African American: 82 mL/min (ref >=60)
Glucose, Bld: 95 mg/dL (ref 65–99)
Potassium: 4.4 mmol/L (ref 3.5–5.3)
Sodium: 140 mmol/L (ref 135–146)

## 2016-12-29 LAB — URINALYSIS, ROUTINE W REFLEX MICROSCOPIC
BILIRUBIN URINE: NEGATIVE
Glucose, UA: NEGATIVE
Hgb urine dipstick: NEGATIVE
KETONES UR: NEGATIVE
Leukocytes, UA: NEGATIVE
Nitrite: NEGATIVE
PH: 6 (ref 5.0–8.0)
Protein, ur: NEGATIVE
SPECIFIC GRAVITY, URINE: 1.013 (ref 1.001–1.035)

## 2016-12-29 LAB — HEPATIC FUNCTION PANEL
ALBUMIN: 4.5 g/dL (ref 3.6–5.1)
ALK PHOS: 66 U/L (ref 40–115)
ALT: 36 U/L (ref 9–46)
AST: 28 U/L (ref 10–35)
Bilirubin, Direct: 0.2 mg/dL (ref <=0.2)
Indirect Bilirubin: 1.3 mg/dL — ABNORMAL HIGH (ref 0.2–1.2)
TOTAL PROTEIN: 7.1 g/dL (ref 6.1–8.1)
Total Bilirubin: 1.5 mg/dL — ABNORMAL HIGH (ref 0.2–1.2)

## 2016-12-29 LAB — VITAMIN D 25 HYDROXY (VIT D DEFICIENCY, FRACTURES): Vit D, 25-Hydroxy: 48 ng/mL (ref 30–100)

## 2016-12-29 LAB — PSA: PSA: 1.4 ng/mL (ref <=4.0)

## 2016-12-29 LAB — MICROALBUMIN / CREATININE URINE RATIO
CREATININE, URINE: 137 mg/dL (ref 20–370)
Microalb Creat Ratio: 4 mcg/mg creat (ref <30)
Microalb, Ur: 0.6 mg/dL

## 2016-12-29 LAB — MAGNESIUM: Magnesium: 1.8 mg/dL (ref 1.5–2.5)

## 2016-12-29 LAB — TSH: TSH: 2.78 mIU/L (ref 0.40–4.50)

## 2017-04-11 NOTE — Progress Notes (Signed)
Assessment and Plan:  Hypertension:  -RESTART medication,  -monitor blood pressure at home.  -Continue DASH diet.   -Reminder to go to the ER if any CP, SOB, nausea, dizziness, severe HA, changes vision/speech, left arm numbness and tingling, and jaw pain.  Cholesterol: -Continue diet and exercise.  -Check cholesterol.   Pre-diabetes: -Continue diet and exercise.  -Check A1C  Vitamin D Def: -continue medications.   Morbid Obesity with co morbidities - long discussion about weight loss, diet, and exercise  Hematuria, unspecified type ? Stone, may need KUB -     Urinalysis, Routine w reflex microscopic -     Urine culture   Continue diet and meds as discussed. Further disposition pending results of labs.  HPI 56 y.o. male  presents for 3 month follow up with hypertension, hyperlipidemia, prediabetes and vitamin D.   His blood pressure has not been controlled at home, has been out of ziac x several day, today their BP is BP: (!) 142/100 (right arm).   He does not workout. He denies chest pain, shortness of breath, dizziness, headaches, blurry vision, weakness.    He is on cholesterol medication, he is atorvastatin 80mg  and zetia 10mg  and denies myalgias. His cholesterol is not at goal. The cholesterol last visit was:   Lab Results  Component Value Date   CHOL 192 12/28/2016   HDL 29 (L) 12/28/2016   LDLCALC 123 (H) 12/28/2016   TRIG 199 (H) 12/28/2016   CHOLHDL 6.6 (H) 12/28/2016   Possibly passed a stone, had burning and passed black thing, has been urinating better.   He has been working on diet and exercise for prediabetes, and denies foot ulcerations, hyperglycemia, hypoglycemia , increased appetite, nausea, paresthesia of the feet, polydipsia, polyuria, visual disturbances, vomiting and weight loss. Last A1C in the office was:  Lab Results  Component Value Date   HGBA1C 6.3 (H) 12/28/2016   Patient is on Vitamin D supplement.  Lab Results  Component Value Date   VD25OH 34 12/28/2016    Wife lost her sister and has been crying, got her citizenship last week  BMI is Body mass index is 38.2 kg/m., he is working on diet and exercise. He is on CPAP for OSA.  Wt Readings from Last 3 Encounters:  04/12/17 266 lb 3.2 oz (120.7 kg)  12/28/16 273 lb 9.6 oz (124.1 kg)  06/08/16 261 lb (118.4 kg)     Current Medications:  Current Outpatient Prescriptions on File Prior to Visit  Medication Sig Dispense Refill  . atorvastatin (LIPITOR) 80 MG tablet Take 1 tablet (80 mg total) by mouth daily at 6 PM. 90 tablet 1  . BABY ASPIRIN PO Take 81 mg by mouth daily.    . Cholecalciferol (VITAMIN D PO) Take 5,000 Int'l Units by mouth daily.    . Cyanocobalamin (VITAMIN B-12 PO) Take 1 tablet by mouth daily.    Marland Kitchen ezetimibe (ZETIA) 10 MG tablet Take 1 tablet (10 mg total) by mouth daily. 90 tablet 1  . Omega-3 Fatty Acids (FISH OIL PO) Take 1,200 mg by mouth daily.     No current facility-administered medications on file prior to visit.     Medical History:  Past Medical History:  Diagnosis Date  . Hyperlipidemia   . Hypertension   . Prediabetes   . Vitamin D deficiency     Allergies: No Known Allergies   Review of Systems:  Review of Systems  Constitutional: Negative for chills, fever and malaise/fatigue.  HENT: Negative  for congestion, ear pain and sore throat.   Eyes: Negative.   Respiratory: Negative for cough, shortness of breath and wheezing.   Cardiovascular: Negative for chest pain, palpitations and leg swelling.  Gastrointestinal: Negative for abdominal pain, blood in stool, constipation, diarrhea, heartburn and melena.  Genitourinary: Negative.   Skin: Negative.   Neurological: Negative for dizziness, sensory change, loss of consciousness and headaches.  Psychiatric/Behavioral: Negative for depression. The patient is nervous/anxious and has insomnia.     Family history- Review and unchanged  Social history- Review and unchanged  Physical  Exam: BP (!) 142/100 Comment: right arm  Pulse 78   Temp 97.6 F (36.4 C)   Resp 14   Ht 5\' 10"  (1.778 m)   Wt 266 lb 3.2 oz (120.7 kg)   SpO2 97%   BMI 38.20 kg/m  Wt Readings from Last 3 Encounters:  04/12/17 266 lb 3.2 oz (120.7 kg)  12/28/16 273 lb 9.6 oz (124.1 kg)  06/08/16 261 lb (118.4 kg)    General Appearance: Well nourished well developed, in no apparent distress. Eyes: PERRLA, EOMs, conjunctiva no swelling or erythema ENT/Mouth: Ear canals normal without obstruction, swelling, erythma, discharge.  TMs normal bilaterally.  Oropharynx moist, clear, without exudate, or postoropharyngeal swelling. Neck: Supple, thyroid normal,no cervical adenopathy  Respiratory: Respiratory effort normal, Breath sounds clear A&P without rhonchi, wheeze, or rale.  No retractions, no accessory usage. Cardio: RRR with no MRGs. Brisk peripheral pulses without edema.  Abdomen: Soft, + BS,  Non tender, no guarding, rebound, hernias, masses. Musculoskeletal: Full ROM, 5/5 strength, Normal gait Skin: Warm, dry without rashes, lesions, ecchymosis.  Neuro: Awake and oriented X 3, Cranial nerves intact. Normal muscle tone, no cerebellar symptoms. Psych: Normal affect, Insight and Judgment appropriate.    Quentin MullingAmanda Donell Sliwinski, PA-C 4:37 PM [facility]

## 2017-04-12 ENCOUNTER — Encounter: Payer: Self-pay | Admitting: Physician Assistant

## 2017-04-12 ENCOUNTER — Ambulatory Visit: Payer: Self-pay | Admitting: Physician Assistant

## 2017-04-12 ENCOUNTER — Other Ambulatory Visit: Payer: Self-pay

## 2017-04-12 ENCOUNTER — Ambulatory Visit (INDEPENDENT_AMBULATORY_CARE_PROVIDER_SITE_OTHER): Payer: BLUE CROSS/BLUE SHIELD | Admitting: Physician Assistant

## 2017-04-12 ENCOUNTER — Telehealth: Payer: Self-pay | Admitting: Physician Assistant

## 2017-04-12 VITALS — BP 142/100 | HR 78 | Temp 97.6°F | Resp 14 | Ht 70.0 in | Wt 266.2 lb

## 2017-04-12 DIAGNOSIS — Z6835 Body mass index (BMI) 35.0-35.9, adult: Secondary | ICD-10-CM

## 2017-04-12 DIAGNOSIS — E782 Mixed hyperlipidemia: Secondary | ICD-10-CM | POA: Diagnosis not present

## 2017-04-12 DIAGNOSIS — I10 Essential (primary) hypertension: Secondary | ICD-10-CM

## 2017-04-12 DIAGNOSIS — R7303 Prediabetes: Secondary | ICD-10-CM | POA: Diagnosis not present

## 2017-04-12 DIAGNOSIS — Z79899 Other long term (current) drug therapy: Secondary | ICD-10-CM

## 2017-04-12 DIAGNOSIS — E559 Vitamin D deficiency, unspecified: Secondary | ICD-10-CM

## 2017-04-12 DIAGNOSIS — R319 Hematuria, unspecified: Secondary | ICD-10-CM | POA: Diagnosis not present

## 2017-04-12 LAB — CBC WITH DIFFERENTIAL/PLATELET
BASOS PCT: 1 %
Basophils Absolute: 58 cells/uL (ref 0–200)
EOS PCT: 2 %
Eosinophils Absolute: 116 cells/uL (ref 15–500)
HEMATOCRIT: 42.1 % (ref 38.5–50.0)
HEMOGLOBIN: 13.6 g/dL (ref 13.2–17.1)
LYMPHS ABS: 1914 {cells}/uL (ref 850–3900)
Lymphocytes Relative: 33 %
MCH: 27.5 pg (ref 27.0–33.0)
MCHC: 32.3 g/dL (ref 32.0–36.0)
MCV: 85.1 fL (ref 80.0–100.0)
MONO ABS: 638 {cells}/uL (ref 200–950)
MPV: 10.6 fL (ref 7.5–12.5)
Monocytes Relative: 11 %
NEUTROS ABS: 3074 {cells}/uL (ref 1500–7800)
NEUTROS PCT: 53 %
Platelets: 215 10*3/uL (ref 140–400)
RBC: 4.95 MIL/uL (ref 4.20–5.80)
RDW: 14 % (ref 11.0–15.0)
WBC: 5.8 10*3/uL (ref 3.8–10.8)

## 2017-04-12 MED ORDER — PREDNISONE 20 MG PO TABS: ORAL_TABLET | ORAL | 0 refills | Status: DC

## 2017-04-12 MED ORDER — BISOPROLOL-HYDROCHLOROTHIAZIDE 5-6.25 MG PO TABS: ORAL_TABLET | ORAL | 0 refills | Status: DC

## 2017-04-12 NOTE — Telephone Encounter (Signed)
-----   Message from Gregery NaAngela D Duff, CMA sent at 04/12/2017  5:04 PM EDT ----- Regarding: poison oak Pt states he forgot to tell you that he has a poison oak rash on his arms, legs, & feet & would like you to send him something for his rash please.

## 2017-04-12 NOTE — Patient Instructions (Signed)
Kidney Stones  Kidney stones (urolithiasis) are solid, rock-like deposits that form inside of the organs that make urine (kidneys). A kidney stone may form in a kidney and move into the bladder, where it can cause intense pain and block the flow of urine. Kidney stones are created when high levels of certain minerals are found in the urine. They are usually passed through urination, but in some cases, medical treatment may be needed to remove them.  What are the causes?  Kidney stones may be caused by:  · A condition in which certain glands produce too much parathyroid hormone (primary hyperparathyroidism), which causes too much calcium buildup in the blood.  · Buildup of uric acid crystals in the bladder (hyperuricosuria). Uric acid is a chemical that the body produces when you eat certain foods. It usually exits the body in the urine.  · Narrowing (stricture) of one or both of the tubes that drain urine from the kidneys to the bladder (ureters).  · A kidney blockage that is present at birth (congenital obstruction).  · Past surgery on the kidney or the ureters, such as gastric bypass surgery.    What increases the risk?  The following factors make you more likely to develop kidney stones:  · Having had a kidney stone in the past.  · Having a family history of kidney stones.  · Not drinking enough water.  · Eating a diet that is high in protein, salt (sodium), or sugar.  · Being overweight or obese.    What are the signs or symptoms?  Symptoms of a kidney stone may include:  · Nausea.  · Vomiting.  · Blood in the urine (hematuria).  · Pain in the side of the abdomen, right below the ribs (flank pain). Pain usually spreads (radiates) to the groin.  · Needing to urinate frequently or urgently.    How is this diagnosed?  This condition may be diagnosed based on:  · Your medical history.  · A physical exam.  · Blood tests.  · Urine tests.  · CT scan.  · Abdominal X-ray.  · A procedure to examine the inside of the  bladder (cystoscopy).    How is this treated?  Treatment for kidney stones depends on the size, location, and makeup of the stones. Treatment may involve:  · Analyzing your urine before and after you pass the stone through urination.  · Being monitored at the hospital until you pass the stone through urination.  · Increasing your fluid intake and decreasing the amount of calcium and protein in your diet.  · A procedure to break up kidney stones in the bladder using:  ? A focused beam of light (laser therapy).  ? Shock waves (extracorporeal shock wave lithotripsy).  · Surgery to remove kidney stones. This may be needed if you have severe pain or have stones that block your urinary tract.    Follow these instructions at home:  Eating and drinking     · Drink enough fluid to keep your urine clear or pale yellow. This will help you to pass the kidney stone.  · If directed, change your diet. This may include:  ? Limiting how much sodium you eat.  ? Eating more fruits and vegetables.  ? Limiting how much meat, poultry, fish, and eggs you eat.  · Follow instructions from your health care provider about eating or drinking restrictions.  General instructions   · Collect urine samples as told by your health care   provider. You may need to collect a urine sample:  ? 24 hours after you pass the stone.  ? 8-12 weeks after passing the kidney stone, and every 6-12 months after that.  · Strain your urine every time you urinate, for as long as directed. Use the strainer that your health care provider recommends.  · Do not throw out the kidney stone after passing it. Keep the stone so it can be tested by your health care provider. Testing the makeup of your kidney stone may help prevent you from getting kidney stones in the future.  · Take over-the-counter and prescription medicines only as told by your health care provider.  · Keep all follow-up visits as told by your health care provider. This is important. You may need follow-up  X-rays or ultrasounds to make sure that your stone has passed.  How is this prevented?  To prevent another kidney stone:  · Drink enough fluid to keep your urine clear or pale yellow. This is the best way to prevent kidney stones.  · Eat a healthy diet and follow recommendations from your health care provider about foods to avoid. You may be instructed to eat a low-protein diet. Recommendations vary depending on the type of kidney stone that you have.  · Maintain a healthy weight.    Contact a health care provider if:  · You have pain that gets worse or does not get better with medicine.  Get help right away if:  · You have a fever or chills.  · You develop severe pain.  · You develop new abdominal pain.  · You faint.  · You are unable to urinate.  This information is not intended to replace advice given to you by your health care provider. Make sure you discuss any questions you have with your health care provider.  Document Released: 10/26/2005 Document Revised: 05/15/2016 Document Reviewed: 04/10/2016  Elsevier Interactive Patient Education © 2017 [employer]

## 2017-04-13 LAB — LIPID PANEL
CHOL/HDL RATIO: 6.5 ratio — AB (ref <5.0)
Cholesterol: 162 mg/dL (ref <200)
HDL: 25 mg/dL — ABNORMAL LOW (ref >40)
LDL CALC: 85 mg/dL (ref <100)
Triglycerides: 262 mg/dL — ABNORMAL HIGH (ref <150)
VLDL: 52 mg/dL — AB (ref <30)

## 2017-04-13 LAB — BASIC METABOLIC PANEL WITH GFR
BUN: 18 mg/dL (ref 7–25)
CHLORIDE: 108 mmol/L (ref 98–110)
CO2: 22 mmol/L (ref 20–31)
Calcium: 9.4 mg/dL (ref 8.6–10.3)
Creat: 1.11 mg/dL (ref 0.70–1.33)
GFR, EST NON AFRICAN AMERICAN: 74 mL/min (ref >=60)
GFR, Est African American: 85 mL/min (ref >=60)
GLUCOSE: 123 mg/dL — AB (ref 65–99)
POTASSIUM: 3.9 mmol/L (ref 3.5–5.3)
SODIUM: 143 mmol/L (ref 135–146)

## 2017-04-13 LAB — HEMOGLOBIN A1C
Hgb A1c MFr Bld: 6.3 % — ABNORMAL HIGH (ref <5.7)
MEAN PLASMA GLUCOSE: 134 mg/dL

## 2017-04-13 LAB — URINALYSIS, ROUTINE W REFLEX MICROSCOPIC
Bilirubin Urine: NEGATIVE
GLUCOSE, UA: NEGATIVE
Hgb urine dipstick: NEGATIVE
KETONES UR: NEGATIVE
LEUKOCYTES UA: NEGATIVE
NITRITE: NEGATIVE
PH: 5.5 (ref 5.0–8.0)
Protein, ur: NEGATIVE
SPECIFIC GRAVITY, URINE: 1.026 (ref 1.001–1.035)

## 2017-04-13 LAB — HEPATIC FUNCTION PANEL
ALK PHOS: 67 U/L (ref 40–115)
ALT: 33 U/L (ref 9–46)
AST: 26 U/L (ref 10–35)
Albumin: 4.3 g/dL (ref 3.6–5.1)
BILIRUBIN DIRECT: 0.3 mg/dL — AB (ref <=0.2)
BILIRUBIN INDIRECT: 1.4 mg/dL — AB (ref 0.2–1.2)
TOTAL PROTEIN: 6.7 g/dL (ref 6.1–8.1)
Total Bilirubin: 1.7 mg/dL — ABNORMAL HIGH (ref 0.2–1.2)

## 2017-04-13 LAB — TSH: TSH: 1.62 mIU/L (ref 0.40–4.50)

## 2017-04-13 LAB — MAGNESIUM: MAGNESIUM: 2 mg/dL (ref 1.5–2.5)

## 2017-04-13 LAB — VITAMIN D 25 HYDROXY (VIT D DEFICIENCY, FRACTURES): Vit D, 25-Hydroxy: 45 ng/mL (ref 30–100)

## 2017-04-13 LAB — URINE CULTURE: Organism ID, Bacteria: NO GROWTH

## 2017-04-13 NOTE — Telephone Encounter (Signed)
Pt aware of Rx that was sent to pharmacy. 

## 2017-04-13 NOTE — Telephone Encounter (Signed)
VM BOX NOT SET UP YET.

## 2017-04-13 NOTE — Progress Notes (Signed)
Pt aware of lab results & voiced understanding of those results.

## 2017-04-15 NOTE — Progress Notes (Signed)
Pt aware of lab results & voiced understanding of those results.

## 2017-07-15 NOTE — Progress Notes (Deleted)
Assessment and Plan:  Hypertension:  -RESTART medication,  -monitor blood pressure at home.  -Continue DASH diet.   -Reminder to go to the ER if any CP, SOB, nausea, dizziness, severe HA, changes vision/speech, left arm numbness and tingling, and jaw pain.  Cholesterol: -Continue diet and exercise.  -Check cholesterol.   Pre-diabetes: -Continue diet and exercise.  -Check A1C  Vitamin D Def: -continue medications.   Morbid Obesity with co morbidities - long discussion about weight loss, diet, and exercise  Hematuria, unspecified type ? Stone, may need KUB -     Urinalysis, Routine w reflex microscopic -     Urine culture   Continue diet and meds as discussed. Further disposition pending results of labs.  HPI 56 y.o. male  presents for 3 month follow up with hypertension, hyperlipidemia, prediabetes and vitamin D.   His blood pressure has not been controlled at home, has been out of ziac x several day, today their BP is  .   He does not workout. He denies chest pain, shortness of breath, dizziness, headaches, blurry vision, weakness.    He is on cholesterol medication, he is atorvastatin 80mg  and zetia 10mg  and denies myalgias. His cholesterol is not at goal. The cholesterol last visit was:   Lab Results  Component Value Date   CHOL 162 04/12/2017   HDL 25 (L) 04/12/2017   LDLCALC 85 04/12/2017   TRIG 262 (H) 04/12/2017   CHOLHDL 6.5 (H) 04/12/2017   Possibly passed a stone, had burning and passed black thing, has been urinating better.   He has been working on diet and exercise for prediabetes, and denies foot ulcerations, hyperglycemia, hypoglycemia , increased appetite, nausea, paresthesia of the feet, polydipsia, polyuria, visual disturbances, vomiting and weight loss. Last A1C in the office was:  Lab Results  Component Value Date   HGBA1C 6.3 (H) 04/12/2017   Patient is on Vitamin D supplement.  Lab Results  Component Value Date   VD25OH 45 04/12/2017    Wife  lost her sister and has been crying, got her citizenship last week  BMI is There is no height or weight on file to calculate BMI., he is working on diet and exercise. He is on CPAP for OSA.  Wt Readings from Last 3 Encounters:  04/12/17 266 lb 3.2 oz (120.7 kg)  12/28/16 273 lb 9.6 oz (124.1 kg)  06/08/16 261 lb (118.4 kg)     Current Medications:  Current Outpatient Prescriptions on File Prior to Visit  Medication Sig Dispense Refill  . atorvastatin (LIPITOR) 80 MG tablet Take 1 tablet (80 mg total) by mouth daily at 6 PM. 90 tablet 1  . BABY ASPIRIN PO Take 81 mg by mouth daily.    . bisoprolol-hydrochlorothiazide (ZIAC) 5-6.25 MG tablet TAKE ONE TABLET BY MOUTH ONCE DAILY FOR BLOOD PRESSURE 90 tablet 0  . Cholecalciferol (VITAMIN D PO) Take 5,000 Int'l Units by mouth daily.    . Cyanocobalamin (VITAMIN B-12 PO) Take 1 tablet by mouth daily.    Marland Kitchen. ezetimibe (ZETIA) 10 MG tablet Take 1 tablet (10 mg total) by mouth daily. 90 tablet 1  . Omega-3 Fatty Acids (FISH OIL PO) Take 1,200 mg by mouth daily.    . predniSONE (DELTASONE) 20 MG tablet 2 tablets daily for 3 days, 1 tablet daily for 4 days. 10 tablet 0   No current facility-administered medications on file prior to visit.     Medical History:  Past Medical History:  Diagnosis Date  .  Hyperlipidemia   . Hypertension   . Prediabetes   . Vitamin D deficiency     Allergies: No Known Allergies   Review of Systems:  Review of Systems  Constitutional: Negative for chills, fever and malaise/fatigue.  HENT: Negative for congestion, ear pain and sore throat.   Eyes: Negative.   Respiratory: Negative for cough, shortness of breath and wheezing.   Cardiovascular: Negative for chest pain, palpitations and leg swelling.  Gastrointestinal: Negative for abdominal pain, blood in stool, constipation, diarrhea, heartburn and melena.  Genitourinary: Negative.   Skin: Negative.   Neurological: Negative for dizziness, sensory change, loss  of consciousness and headaches.  Psychiatric/Behavioral: Negative for depression. The patient is nervous/anxious and has insomnia.     Family history- Review and unchanged  Social history- Review and unchanged  Physical Exam: There were no vitals taken for this visit. Wt Readings from Last 3 Encounters:  04/12/17 266 lb 3.2 oz (120.7 kg)  12/28/16 273 lb 9.6 oz (124.1 kg)  06/08/16 261 lb (118.4 kg)    General Appearance: Well nourished well developed, in no apparent distress. Eyes: PERRLA, EOMs, conjunctiva no swelling or erythema ENT/Mouth: Ear canals normal without obstruction, swelling, erythma, discharge.  TMs normal bilaterally.  Oropharynx moist, clear, without exudate, or postoropharyngeal swelling. Neck: Supple, thyroid normal,no cervical adenopathy  Respiratory: Respiratory effort normal, Breath sounds clear A&P without rhonchi, wheeze, or rale.  No retractions, no accessory usage. Cardio: RRR with no MRGs. Brisk peripheral pulses without edema.  Abdomen: Soft, + BS,  Non tender, no guarding, rebound, hernias, masses. Musculoskeletal: Full ROM, 5/5 strength, Normal gait Skin: Warm, dry without rashes, lesions, ecchymosis.  Neuro: Awake and oriented X 3, Cranial nerves intact. Normal muscle tone, no cerebellar symptoms. Psych: Normal affect, Insight and Judgment appropriate.    Quentin Mulling, PA-C 7:52 AM [facility]

## 2017-07-16 ENCOUNTER — Other Ambulatory Visit: Payer: Self-pay | Admitting: Physician Assistant

## 2017-07-16 DIAGNOSIS — E782 Mixed hyperlipidemia: Secondary | ICD-10-CM

## 2017-07-19 ENCOUNTER — Ambulatory Visit: Payer: Self-pay | Admitting: Physician Assistant

## 2017-08-01 NOTE — Progress Notes (Signed)
FOLLOW UP  Assessment and Plan:    Hypertension  Continue medication: bisoprolol-hctz 5-6.25, 1 tab daily  Monitor blood pressure at home.  Continue DASH diet.    Reminder to go to the ER if any CP, SOB, nausea, dizziness, severe HA, changes vision/speech, left arm numbness and tingling and jaw pain.  Cholesterol  Continue medications: atorvastatin 80 mg daily, zetia 10 mg daily  Continue diet and exercise.   Check lipid panel.    Prediabetes  Currently stable in prediabetic range  Continue diet and exercise- reduce pastries to twice a week from daily consumption  Perform daily foot/skin check, notify office of any concerning changes.   Check A1C  Vitamin D Def  Continue medications: 5000 IU daily  Check Vit D level  Rash to upper back  Apply triamcinolone ointment BID to area of concern; continue to monitor   Continue diet and meds as discussed. Further disposition pending results of labs. Discussed med's effects and SE's.   Over 30 minutes of exam, counseling, chart review, and critical decision making was performed.   Future Appointments Date Time Provider Department Center  08/02/2017 10:30 AM Judd Gaudier, NP [facility] None  01/03/2018 10:00 AM Quentin Mulling, PA-C [facility] None    ----------------------------------------------------------------------------------------------------------------------  HPI 56 y.o. male  Pleasant friendly gentleman presents accompanied by his wife for 3 month follow up on hypertension, cholesterol, prediabetes, and vitamin D deficiency. He was concerned that he may have a cerumen impaction of the left ear- appears clear with scant wax on exam. Encouraged to stop using q-tips, but to use warm water irrigation supplemented by OTC softening agents as needed. His wife is concerned about an area on his back that he has been scratching frequently at night.   He has been working on diet; pushing lots of vegetables, lean  protein (chicke), rarely eats steak/red meat. He is eating 3 eggs daily (encouraged to reduce) and eating pastries (oatmeal cookies, doughnuts) daily at work, discussed that he should avoid this and sweetened drinks and only partake occasionally- encouraged to decrease to 2 times per week. He is agreeable to this plan.   His blood pressure has been controlled at home, today their BP is BP: 130/80  He does workout- very physical job (walking/pushing carts, delivery). He denies chest pain, shortness of breath, dizziness.   He is on cholesterol medication and denies myalgias. His cholesterol is not at goal. The cholesterol last visit was:   Lab Results  Component Value Date   CHOL 162 04/12/2017   HDL 25 (L) 04/12/2017   LDLCALC 85 04/12/2017   TRIG 262 (H) 04/12/2017   CHOLHDL 6.5 (H) 04/12/2017    He has been working on diet and exercise for prediabetes, and denies hyperglycemia, hypoglycemia , nausea, paresthesia of the feet, polydipsia and polyuria. Last A1C in the office was:  Lab Results  Component Value Date   HGBA1C 6.3 (H) 04/12/2017   Patient is on Vitamin D supplement.   Lab Results  Component Value Date   VD25OH 45 04/12/2017          Current Medications:  Current Outpatient Prescriptions on File Prior to Visit  Medication Sig  . atorvastatin (LIPITOR) 80 MG tablet TAKE 1 TABLET BY MOUTH ONCE DAILY AT 6PM  . BABY ASPIRIN PO Take 81 mg by mouth daily.  . bisoprolol-hydrochlorothiazide (ZIAC) 5-6.25 MG tablet TAKE ONE TABLET BY MOUTH ONCE DAILY FOR BLOOD PRESSURE  . Cholecalciferol (VITAMIN D PO) Take 5,000 Int'l Units by  mouth daily.  . Cyanocobalamin (VITAMIN B-12 PO) Take 1 tablet by mouth daily.  Marland Kitchen ezetimibe (ZETIA) 10 MG tablet TAKE 1 TABLET BY MOUTH ONCE DAILY  . Omega-3 Fatty Acids (FISH OIL PO) Take 1,200 mg by mouth daily.   No current facility-administered medications on file prior to visit.      Allergies: No Known Allergies   Medical History:  Past  Medical History:  Diagnosis Date  . Hyperlipidemia   . Hypertension   . Prediabetes   . Vitamin D deficiency    Family history- Reviewed and unchanged Social history- Reviewed and unchanged   Review of Systems:  Review of Systems  Constitutional: Negative.   HENT: Negative for congestion, hearing loss and sore throat.   Eyes: Negative for blurred vision.  Respiratory: Negative for cough, shortness of breath and wheezing.   Cardiovascular: Negative for chest pain and palpitations.  Gastrointestinal: Negative for abdominal pain, blood in stool, constipation, melena and nausea.  Genitourinary: Negative.   Musculoskeletal: Negative for myalgias.  Skin: Negative.   Neurological: Negative for dizziness, sensory change and headaches.  Endo/Heme/Allergies: Negative.   Psychiatric/Behavioral: Negative.       Physical Exam: BP 130/80   Pulse 84   Temp (!) 97.4 F (36.3 C)   Resp 14   Ht  (1.778 m)   Wt 262 lb 12.8 oz (119.2 kg)   SpO2 98%   BMI 37.71 kg/m  Wt Readings from Last 3 Encounters:  08/02/17 262 lb 12.8 oz (119.2 kg)  04/12/17 266 lb 3.2 oz (120.7 kg)  12/28/16 273 lb 9.6 oz (124.1 kg)   General Appearance: Well nourished, in no apparent distress. Eyes: PERRLA, EOMs, conjunctiva no swelling or erythema Sinuses: No Frontal/maxillary tenderness ENT/Mouth: Ext aud canals clear, TMs without erythema, bulging. No erythema, swelling, or exudate on post pharynx.  Tonsils not swollen or erythematous. Hearing normal.  Neck: Supple, thyroid normal.  Respiratory: Respiratory effort normal, BS equal bilaterally without rales, rhonchi, wheezing or stridor.  Cardio: RRR with no MRGs. Brisk peripheral pulses without edema.  Abdomen: Soft, + BS.  Non tender, no guarding, rebound, hernias, masses. Lymphatics: Non tender without lymphadenopathy.  Musculoskeletal: Full ROM, 5/5 strength, Normal gait Skin: Warm, dry; waxy/lichenified area  of concern to mid upper backapprox  5cmx3cm - non injected, without rash/discharge.  Neuro: Cranial nerves intact. No cerebellar symptoms.  Psych: Awake and oriented X 3, normal affect, Insight and Judgment appropriate.    Dan Maker, NP 10:24 AM [facility]

## 2017-08-02 ENCOUNTER — Ambulatory Visit (INDEPENDENT_AMBULATORY_CARE_PROVIDER_SITE_OTHER): Payer: BLUE CROSS/BLUE SHIELD | Admitting: Adult Health

## 2017-08-02 ENCOUNTER — Encounter: Payer: Self-pay | Admitting: Adult Health

## 2017-08-02 VITALS — BP 130/80 | HR 84 | Temp 97.4°F | Resp 14 | Ht 70.0 in | Wt 262.8 lb

## 2017-08-02 DIAGNOSIS — I10 Essential (primary) hypertension: Secondary | ICD-10-CM

## 2017-08-02 DIAGNOSIS — R7303 Prediabetes: Secondary | ICD-10-CM

## 2017-08-02 DIAGNOSIS — R21 Rash and other nonspecific skin eruption: Secondary | ICD-10-CM | POA: Diagnosis not present

## 2017-08-02 DIAGNOSIS — E559 Vitamin D deficiency, unspecified: Secondary | ICD-10-CM | POA: Diagnosis not present

## 2017-08-02 DIAGNOSIS — Z79899 Other long term (current) drug therapy: Secondary | ICD-10-CM

## 2017-08-02 DIAGNOSIS — E782 Mixed hyperlipidemia: Secondary | ICD-10-CM | POA: Diagnosis not present

## 2017-08-02 MED ORDER — TRIAMCINOLONE ACETONIDE 0.1 % EX OINT: 1.0000 "application " | TOPICAL_OINTMENT | Freq: Two times a day (BID) | CUTANEOUS | 1 refills | Status: DC

## 2017-08-02 NOTE — Patient Instructions (Signed)

## 2017-08-03 LAB — HEPATIC FUNCTION PANEL
AG RATIO: 1.7 (calc) (ref 1.0–2.5)
ALKALINE PHOSPHATASE (APISO): 62 U/L (ref 40–115)
ALT: 24 U/L (ref 9–46)
AST: 21 U/L (ref 10–35)
Albumin: 4.2 g/dL (ref 3.6–5.1)
BILIRUBIN INDIRECT: 1.4 mg/dL — AB (ref 0.2–1.2)
BILIRUBIN TOTAL: 1.6 mg/dL — AB (ref 0.2–1.2)
Bilirubin, Direct: 0.2 mg/dL (ref 0.0–0.2)
GLOBULIN: 2.5 g/dL (ref 1.9–3.7)
Total Protein: 6.7 g/dL (ref 6.1–8.1)

## 2017-08-03 LAB — INSULIN, RANDOM: INSULIN: 27.7 u[IU]/mL — AB (ref 2.0–19.6)

## 2017-08-03 LAB — BASIC METABOLIC PANEL WITH GFR
BUN: 18 mg/dL (ref 7–25)
CO2: 29 mmol/L (ref 20–32)
CREATININE: 1.05 mg/dL (ref 0.70–1.33)
Calcium: 9.5 mg/dL (ref 8.6–10.3)
Chloride: 105 mmol/L (ref 98–110)
GFR, EST NON AFRICAN AMERICAN: 79 mL/min/{1.73_m2} (ref >=60)
GFR, Est African American: 92 mL/min/{1.73_m2} (ref >=60)
Glucose, Bld: 113 mg/dL — ABNORMAL HIGH (ref 65–99)
Potassium: 4.1 mmol/L (ref 3.5–5.3)
SODIUM: 140 mmol/L (ref 135–146)

## 2017-08-03 LAB — CBC WITH DIFFERENTIAL/PLATELET
BASOS ABS: 32 {cells}/uL (ref 0–200)
Basophils Relative: 0.5 %
EOS ABS: 109 {cells}/uL (ref 15–500)
Eosinophils Relative: 1.7 %
HEMATOCRIT: 42.6 % (ref 38.5–50.0)
HEMOGLOBIN: 14.2 g/dL (ref 13.2–17.1)
LYMPHS ABS: 2112 {cells}/uL (ref 850–3900)
MCH: 27.7 pg (ref 27.0–33.0)
MCHC: 33.3 g/dL (ref 32.0–36.0)
MCV: 83.2 fL (ref 80.0–100.0)
MPV: 11.7 fL (ref 7.5–12.5)
Monocytes Relative: 8.6 %
NEUTROS ABS: 3597 {cells}/uL (ref 1500–7800)
Neutrophils Relative %: 56.2 %
Platelets: 209 10*3/uL (ref 140–400)
RBC: 5.12 10*6/uL (ref 4.20–5.80)
RDW: 13 % (ref 11.0–15.0)
Total Lymphocyte: 33 %
WBC: 6.4 10*3/uL (ref 3.8–10.8)
WBCMIX: 550 {cells}/uL (ref 200–950)

## 2017-08-03 LAB — VITAMIN D 25 HYDROXY (VIT D DEFICIENCY, FRACTURES): Vit D, 25-Hydroxy: 55 ng/mL (ref 30–100)

## 2017-08-03 LAB — HEMOGLOBIN A1C
HEMOGLOBIN A1C: 6.4 %{Hb} — AB (ref <5.7)
Mean Plasma Glucose: 137 (calc)
eAG (mmol/L): 7.6 (calc)

## 2017-08-03 LAB — LIPID PANEL
CHOL/HDL RATIO: 6.7 (calc) — AB (ref <5.0)
Cholesterol: 182 mg/dL (ref <200)
HDL: 27 mg/dL — ABNORMAL LOW (ref >40)
LDL Cholesterol (Calc): 124 mg/dL (calc) — ABNORMAL HIGH
NON-HDL CHOLESTEROL (CALC): 155 mg/dL — AB (ref <130)
TRIGLYCERIDES: 195 mg/dL — AB (ref <150)

## 2017-08-03 LAB — MAGNESIUM: Magnesium: 1.8 mg/dL (ref 1.5–2.5)

## 2017-08-03 LAB — TSH: TSH: 2.18 m[IU]/L (ref 0.40–4.50)

## 2017-08-03 NOTE — Progress Notes (Signed)
Pt aware of lab results & voiced understanding of those results.

## 2017-12-28 ENCOUNTER — Encounter: Payer: Self-pay | Admitting: Physician Assistant

## 2017-12-30 NOTE — Progress Notes (Deleted)
Complete Physical  Assessment and Plan:  Essential hypertension - continue medications, DASH diet, exercise and monitor at home. Call if greater than 130/80.  -     CBC with Differential/Platelet -     BASIC METABOLIC PANEL WITH GFR -     Hepatic function panel -     TSH -     Urinalysis, Routine w reflex microscopic -     Microalbumin / creatinine urine ratio -     EKG 12-Lead -     bisoprolol-hydrochlorothiazide (ZIAC) 5-6.25 MG tablet; TAKE ONE TABLET BY MOUTH ONCE DAILY FOR BLOOD PRESSURE  Hyperlipidemia -continue medications, check lipids, decrease fatty foods, increase activity.  -     Lipid panel -     ezetimibe (ZETIA) 10 MG tablet; Take 1 tablet (10 mg total) by mouth daily.  OSA (obstructive sleep apnea) Sleep apnea- continue CPAP, weight loss advised.   Prediabetes Discussed general issues about diabetes pathophysiology and management., Educational material distributed., Suggested low cholesterol diet., Encouraged aerobic exercise., Discussed foot care., Reminded to get yearly retinal exam. -     Hemoglobin A1c  Vitamin D deficiency -     VITAMIN D 25 Hydroxy (Vit-D Deficiency, Fractures)  Medication management -     Magnesium  Encounter for general adult medical examination with abnormal findings  Morbid Obesity with co morbidities - long discussion about weight loss, diet, and exercise  Prostate cancer screening -     PSA   Discussed med's effects and SE's. Screening labs and tests as requested with regular follow-up as recommended. Over 40 minutes of exam, counseling, chart review and critical decision making was performed Future Appointments  Date Time Provider Department Center  01/03/2018 10:00 AM Quentin Mullingollier, [employer] Student, PA-C [facility] None    HPI Patient presents for a complete physical.   His blood pressure has been controlled at home, today their BP is   He does not workout but states that his current job is very physical He denies chest pain,  shortness of breath, dizziness.  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  He is on cholesterol medication and denies myalgias. His cholesterol is not at goal. The cholesterol last visit was:   Lab Results  Component Value Date   CHOL 182 08/02/2017   HDL 27 (L) 08/02/2017   LDLCALC 85 04/12/2017   TRIG 195 (H) 08/02/2017   CHOLHDL 6.7 (H) 08/02/2017   He has been working on diet and exercise for prediabetes,and denies paresthesia of the feet, polydipsia, polyuria and visual disturbances. Last A1C in the office was:  Lab Results  Component Value Date   HGBA1C 6.4 (H) 08/02/2017    Patient is on Vitamin D supplement.   Lab Results  Component Value Date   VD25OH 55 08/02/2017     Last PSA was: Lab Results  Component Value Date   PSA 1.4 12/28/2016   BMI is There is no height or weight on file to calculate BMI., he is working on diet and exercise. He is on CPAP for OSA and using daily.  Wt Readings from Last 3 Encounters:  08/02/17 262 lb 12.8 oz (119.2 kg)  04/12/17 266 lb 3.2 oz (120.7 kg)  12/28/16 273 lb 9.6 oz (124.1 kg)     Current Medications:  Current Outpatient Medications on File Prior to Visit  Medication Sig  . atorvastatin (LIPITOR) 80 MG tablet TAKE 1 TABLET BY MOUTH ONCE DAILY AT 6PM  . BABY ASPIRIN PO Take 81 mg by mouth daily.  .Marland Kitchen  bisoprolol-hydrochlorothiazide (ZIAC) 5-6.25 MG tablet TAKE ONE TABLET BY MOUTH ONCE DAILY FOR BLOOD PRESSURE  . Cholecalciferol (VITAMIN D PO) Take 5,000 Int'l Units by mouth daily.  . Cyanocobalamin (VITAMIN B-12 PO) Take 1 tablet by mouth daily.  Marland Kitchen ezetimibe (ZETIA) 10 MG tablet TAKE 1 TABLET BY MOUTH ONCE DAILY  . Omega-3 Fatty Acids (FISH OIL PO) Take 1,200 mg by mouth daily.  Marland Kitchen triamcinolone ointment (KENALOG) 0.1 % Apply 1 application topically 2 (two) times daily.   No current facility-administered medications on file prior to visit.    Allergies:  No Known Allergies Health Maintenance:  Immunization History   Administered Date(s) Administered  . Influenza Split 10/08/2014  . Influenza-Unspecified 08/07/2015  . PPD Test 10/08/2014, 10/21/2015  . Tdap 08/14/2009   Tetanus: 2010 Pneumovax: N/A Prevnar 13: N/A Flu vaccine: 2017 Zostavax: N/A  DEXA: N/A Colonoscopy: 11/2011 10 year EGD: N/A Eye Exam: [place] Dentist: none  Patient Care Team: Lucky Cowboy, MD as PCP - General (Internal Medicine) Sharrell Ku, MD as Consulting Physician (Gastroenterology)  Medical History:  has Essential hypertension; Hyperlipidemia; Vitamin D deficiency; Prediabetes; Medication management; Morbid obesity BMI (35.49) ; and OSA on CPAP on their problem list.   Surgical History:  He  has a past surgical history that includes Hemorrhoid banding (2013) and Nasal septum surgery (2004).   Family History:  His family history includes Emphysema in his father; Heart attack in his mother; Heart disease in his father; Hyperlipidemia in his mother.   Social History:   reports that he quit smoking about 21 years ago. he has never used smokeless tobacco. He reports that he does not drink alcohol or use drugs.  Review of Systems:  Review of Systems  Constitutional: Negative.   HENT: Negative.   Eyes: Negative.   Respiratory: Negative.   Cardiovascular: Negative.   Gastrointestinal: Negative.   Genitourinary: Negative.   Musculoskeletal: Negative.   Skin: Negative.   Neurological: Negative.   Endo/Heme/Allergies: Negative.   Psychiatric/Behavioral: Negative.     Physical Exam: Estimated body mass index is 37.71 kg/m as calculated from the following:   Height as of 08/02/17: 5\' 10"  (1.778 m).   Weight as of 08/02/17: 262 lb 12.8 oz (119.2 kg). There were no vitals taken for this visit. General Appearance: Well nourished, in no apparent distress.  Eyes: PERRLA, EOMs, conjunctiva no swelling or erythema, normal fundi and vessels.  Sinuses: No Frontal/maxillary tenderness  ENT/Mouth: Ext  aud canals clear, normal light reflex with TMs without erythema, bulging. Good dentition. No erythema, swelling, or exudate on post pharynx. Tonsils not swollen or erythematous. Hearing normal.  Neck: Supple, thyroid normal. No bruits  Respiratory: Respiratory effort normal, BS equal bilaterally without rales, rhonchi, wheezing or stridor.  Cardio: RRR without murmurs, rubs or gallops. Brisk peripheral pulses without edema.  Chest: symmetric, with normal excursions and percussion.  Abdomen: Soft, nontender, no guarding, rebound, hernias, masses, or organomegaly.  Lymphatics: Non tender without lymphadenopathy.  Genitourinary:  Musculoskeletal: Full ROM all peripheral extremities,5/5 strength, and normal gait.  Skin: Warm, dry without rashes, lesions, ecchymosis. Neuro: Cranial nerves intact, reflexes equal bilaterally. Normal muscle tone, no cerebellar symptoms. Sensation intact.  Psych: Awake and oriented X 3, normal affect, Insight and Judgment appropriate.   EKG: WNL no changes. AORTA SCAN: WNL  Quentin Mulling 9:24 AM [facility]

## 2018-01-03 ENCOUNTER — Encounter: Payer: Self-pay | Admitting: Physician Assistant

## 2018-01-14 ENCOUNTER — Other Ambulatory Visit: Payer: Self-pay | Admitting: Physician Assistant

## 2018-01-14 DIAGNOSIS — I10 Essential (primary) hypertension: Secondary | ICD-10-CM

## 2018-01-18 ENCOUNTER — Other Ambulatory Visit: Payer: Self-pay | Admitting: Internal Medicine

## 2018-01-18 DIAGNOSIS — E782 Mixed hyperlipidemia: Secondary | ICD-10-CM

## 2018-03-21 ENCOUNTER — Encounter: Payer: Self-pay | Admitting: Physician Assistant

## 2018-03-28 ENCOUNTER — Encounter: Payer: Self-pay | Admitting: Internal Medicine

## 2018-03-28 ENCOUNTER — Encounter: Payer: Self-pay | Admitting: Physician Assistant

## 2018-04-10 NOTE — Progress Notes (Signed)
Complete Physical  Assessment and Plan:   Encounter for routine adult health examination without abnormal findings  Essential hypertension Continue medication Monitor blood pressure at home; call if consistently over 130/80 Continue DASH diet.   Reminder to go to the ER if any CP, SOB, nausea, dizziness, severe HA, changes vision/speech, left arm numbness and tingling and jaw pain.  OSA on CPAP Continue CPAP; has follow up with Dr. Vickey Huger for adjustment in September  Vitamin D deficiency Continue supplementation Check vitamin D level  Prediabetes Discussed disease and risks; should A1C be above 6.5% this check will initiate metformin Discussed diet/exercise, weight management  A1C  Morbid obesity BMI (35.49)  Long discussion about weight loss, diet, and exercise Recommended diet heavy in fruits and veggies and low in animal meats, cheeses, and dairy products, appropriate calorie intake Discussed appropriate weight for height, goal for next visit 250 lb Follow up at next visit  Hyperlipidemia Continue medications: atorvastatin, zetia Continue low cholesterol diet and exercise.  Check lipid panel.   Orders Placed This Encounter  Procedures  . CBC with Differential/Platelet  . COMPLETE METABOLIC PANEL WITH GFR  . Lipid panel  . TSH  . Hemoglobin A1c  . VITAMIN D 25 Hydroxy (Vit-D Deficiency, Fractures)  . Urinalysis w microscopic + reflex cultur  . Microalbumin / creatinine urine ratio  . PSA  . EKG 12-Lead    Discussed med's effects and SE's. Screening labs and tests as requested with regular follow-up as recommended. Over 40 minutes of exam, counseling, chart review and critical decision making was performed  Future Appointments  Date Time Provider Department Center  07/18/2018 10:30 AM Dohmeier, Porfirio Mylar, MD [facility] None  04/17/2019 10:00 AM Quentin Mulling, PA-C [facility] None     HPI Patient presents for a complete physical. Works in Metallurgist. He  has Essential hypertension; Hyperlipidemia; Vitamin D deficiency; Prediabetes; Medication management; Morbid obesity BMI (HCC) ; and OSA on CPAP on their problem list. OSA followed by Dr. Vickey Huger with follow up scheduled in September. 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   BMI is Body mass index is 37.88 kg/m., he has been working on diet and exercise. Wt Readings from Last 3 Encounters:  04/11/18 264 lb (119.7 kg)  08/02/17 262 lb 12.8 oz (119.2 kg)  04/12/17 266 lb 3.2 oz (120.7 kg)   His blood pressure has been controlled at home, today their BP is BP: 114/78 He does workout (physically intense job). tHe denies chest pain, shortness of breath, dizziness.   He is on cholesterol medication (atorvastatin 80 mg, zetia 10 mg) and denies myalgias. His cholesterol is not at goal. The cholesterol last visit was:   Lab Results  Component Value Date   CHOL 182 08/02/2017   HDL 27 (L) 08/02/2017   LDLCALC 124 (H) 08/02/2017   TRIG 195 (H) 08/02/2017   CHOLHDL 6.7 (H) 08/02/2017   He has been working on diet and exercise for prediabetes, cut out all white bread, sugar, fried foods, he is on bASA, he is not on ACE/ARB and denies foot ulcerations, increased appetite, nausea, paresthesia of the feet, polydipsia, polyuria, visual disturbances, vomiting and weight loss. Last A1C in the office was:  Lab Results  Component Value Date   HGBA1C 6.4 (H) 08/02/2017   Last GFR: Lab Results  Component Value Date   GFRNONAA 79 08/02/2017    Patient is on Vitamin D supplement.   Lab Results  Component Value Date   VD25OH 55 08/02/2017  Last PSA was: Lab Results  Component Value Date   PSA 1.4 12/28/2016    Current Medications:  Current Outpatient Medications on File Prior to Visit  Medication Sig Dispense Refill  . atorvastatin (LIPITOR) 80 MG tablet TAKE 1 TABLET BY MOUTH ONCE DAILY AT 6 PM 90 tablet 1  . BABY ASPIRIN PO Take 81 mg by mouth daily.    .  bisoprolol-hydrochlorothiazide (ZIAC) 5-6.25 MG tablet TAKE 1 TABLET BY MOUTH ONCE DAILY FOR BLOOD PRESSURE 90 tablet 0  . Cholecalciferol (VITAMIN D PO) Take 5,000 Int'l Units by mouth daily.    . Cyanocobalamin (VITAMIN B-12 PO) Take 1 tablet by mouth daily.    . Omega-3 Fatty Acids (FISH OIL PO) Take 1,200 mg by mouth daily.    Marland Kitchen. triamcinolone ointment (KENALOG) 0.1 % Apply 1 application topically 2 (two) times daily. 80 g 1  . ezetimibe (ZETIA) 10 MG tablet TAKE 1 TABLET BY MOUTH ONCE DAILY 90 tablet 1   No current facility-administered medications on file prior to visit.    Allergies:  No Known Allergies Health Maintenance:  Immunization History  Administered Date(s) Administered  . Influenza Split 10/08/2014  . Influenza-Unspecified 08/07/2015  . PPD Test 10/08/2014, 10/21/2015  . Tdap 08/14/2009   Tetanus: 2010 Pneumovax: N/A Prevnar 13: N/A Flu vaccine: 2016, n/a today Shingrix: declines  DEXA: N/A Colonoscopy: 11/2011 10 year EGD: N/A  [place] last 2-3 years ago, but has scheduled for this year Dentist: Last several years ago, has new insurance, will schedule appointment  Patient Care Team: Lucky CowboyMcKeown, William, MD as PCP - General (Internal Medicine) Sharrell KuMedoff, Jeffrey, MD as Consulting Physician (Gastroenterology)  Medical History:  has Essential hypertension; Hyperlipidemia; Vitamin D deficiency; Prediabetes; Medication management; Morbid obesity BMI (HCC) ; and OSA on CPAP on their problem list. Surgical History:  He  has a past surgical history that includes Hemorrhoid banding (2013) and Nasal septum surgery (2004). Family History:  His family history includes Emphysema in his father; Heart attack in his mother; Heart disease in his father; Hyperlipidemia in his mother. Social History:   reports that he quit smoking about 22 years ago. He has never used smokeless tobacco. He reports that he does not drink alcohol or use drugs. Review of Systems:   Review of Systems  Constitutional: Negative for malaise/fatigue and weight loss.  HENT: Negative for hearing loss and tinnitus.   Eyes: Negative for blurred vision and double vision.  Respiratory: Negative for cough, shortness of breath and wheezing.   Cardiovascular: Negative for chest pain, palpitations, orthopnea, claudication and leg swelling.  Gastrointestinal: Negative for abdominal pain, blood in stool, constipation, diarrhea, heartburn, melena, nausea and vomiting.  Genitourinary: Negative.   Musculoskeletal: Negative for joint pain and myalgias.  Skin: Negative for rash.  Neurological: Negative for dizziness, tingling, sensory change, weakness and headaches.  Endo/Heme/Allergies: Negative for polydipsia.  Psychiatric/Behavioral: Negative.   All other systems reviewed and are negative.   Physical Exam: Estimated body mass index is 37.88 kg/m as calculated from the following:   Height as of this encounter: 5\' 10"  (1.778 m).   Weight as of this encounter: 264 lb (119.7 kg). BP 114/78   Pulse (!) 54   Temp (!) 97.5 F (36.4 C)   Ht 5\' 10"  (1.778 m)   Wt 264 lb (119.7 kg)   SpO2 96%   BMI 37.88 kg/m  General Appearance: Well nourished, in no apparent distress.  Eyes: PERRLA, EOMs, conjunctiva no swelling or erythema, normal  fundi and vessels.  Sinuses: No Frontal/maxillary tenderness  ENT/Mouth: Ext aud canals clear, normal light reflex with TMs without erythema, bulging. Good dentition. No erythema, swelling, or exudate on post pharynx. Tonsils not swollen or erythematous. Hearing normal.  Neck: Supple, thyroid normal. No bruits  Respiratory: Respiratory effort normal, BS equal bilaterally without rales, rhonchi, wheezing or stridor.  Cardio: RRR without murmurs, rubs or gallops. Brisk peripheral pulses without edema.  Chest: symmetric, with normal excursions and percussion.  Abdomen: Soft, nontender, no guarding, rebound, hernias, masses, or organomegaly.  Lymphatics:  Non tender without lymphadenopathy.  Genitourinary:  Musculoskeletal: Full ROM all peripheral extremities,5/5 strength, and normal gait.  Skin: Warm, dry without rashes, lesions, ecchymosis. Neuro: Cranial nerves intact, reflexes equal bilaterally. Normal muscle tone, no cerebellar symptoms. Sensation intact.  Psych: Awake and oriented X 3, normal affect, Insight and Judgment appropriate.   EKG: WNL no changes.  Dan Maker 3:27 PM [facility]

## 2018-04-11 ENCOUNTER — Ambulatory Visit: Payer: BLUE CROSS/BLUE SHIELD | Admitting: Adult Health

## 2018-04-11 ENCOUNTER — Encounter: Payer: Self-pay | Admitting: Adult Health

## 2018-04-11 VITALS — BP 114/78 | HR 54 | Temp 97.5°F | Ht 70.0 in | Wt 264.0 lb

## 2018-04-11 DIAGNOSIS — Z1389 Encounter for screening for other disorder: Secondary | ICD-10-CM

## 2018-04-11 DIAGNOSIS — Z0000 Encounter for general adult medical examination without abnormal findings: Secondary | ICD-10-CM | POA: Diagnosis not present

## 2018-04-11 DIAGNOSIS — Z131 Encounter for screening for diabetes mellitus: Secondary | ICD-10-CM | POA: Diagnosis not present

## 2018-04-11 DIAGNOSIS — Z79899 Other long term (current) drug therapy: Secondary | ICD-10-CM

## 2018-04-11 DIAGNOSIS — I10 Essential (primary) hypertension: Secondary | ICD-10-CM | POA: Diagnosis not present

## 2018-04-11 DIAGNOSIS — G4733 Obstructive sleep apnea (adult) (pediatric): Secondary | ICD-10-CM

## 2018-04-11 DIAGNOSIS — Z136 Encounter for screening for cardiovascular disorders: Secondary | ICD-10-CM | POA: Diagnosis not present

## 2018-04-11 DIAGNOSIS — N401 Enlarged prostate with lower urinary tract symptoms: Secondary | ICD-10-CM

## 2018-04-11 DIAGNOSIS — R7303 Prediabetes: Secondary | ICD-10-CM

## 2018-04-11 DIAGNOSIS — Z13220 Encounter for screening for lipoid disorders: Secondary | ICD-10-CM | POA: Diagnosis not present

## 2018-04-11 DIAGNOSIS — E559 Vitamin D deficiency, unspecified: Secondary | ICD-10-CM | POA: Diagnosis not present

## 2018-04-11 DIAGNOSIS — Z125 Encounter for screening for malignant neoplasm of prostate: Secondary | ICD-10-CM | POA: Diagnosis not present

## 2018-04-11 DIAGNOSIS — R350 Frequency of micturition: Secondary | ICD-10-CM | POA: Diagnosis not present

## 2018-04-11 DIAGNOSIS — E782 Mixed hyperlipidemia: Secondary | ICD-10-CM

## 2018-04-11 DIAGNOSIS — Z9989 Dependence on other enabling machines and devices: Secondary | ICD-10-CM

## 2018-04-11 NOTE — Patient Instructions (Addendum)
Continue working on weight loss: would love to see your weight get below 250 lb this year, with goal to get closer to 200 lb   Aim for 7+ servings of fruits and vegetables daily  100+ fluid ounces of water or unsweet tea for healthy kidneys  Limit alcohol intake  Limit animal fats in diet for cholesterol and heart health - choose grass fed whenever available  Aim for low stress - take time to unwind and care for your mental health  Aim for 150 min of moderate intensity exercise weekly for heart health, and weights twice weekly for bone health  Aim for 7-9 hours of sleep daily      When it comes to diets, agreement about the perfect plan isn't easy to find, even among the experts. Experts at the [employer] developed an idea known as the Healthy Eating Plate. Just imagine a plate divided into logical, healthy portions.  The emphasis is on diet quality:  Load up on vegetables and fruits - one-half of your plate: Aim for color and variety, and remember that potatoes don't count.  Go for whole grains - one-quarter of your plate: Whole wheat, barley, wheat berries, quinoa, oats, brown rice, and foods made with them. If you want pasta, go with whole wheat pasta.  Protein power - one-quarter of your plate: Fish, chicken, beans, and nuts are all healthy, versatile protein sources. Limit red meat.  The diet, however, does go beyond the plate, offering a few other suggestions.  Use healthy plant oils, such as olive, canola, soy, corn, sunflower and peanut. Check the labels, and avoid partially hydrogenated oil, which have unhealthy trans fats.  If you're thirsty, drink water. Coffee and tea are good in moderation, but skip sugary drinks and limit milk and dairy products to one or two daily servings.  The type of carbohydrate in the diet is more important than the amount. Some sources of carbohydrates, such as vegetables, fruits, whole grains, and beans-are healthier  than others.  Finally, stay active.

## 2018-04-12 LAB — MICROALBUMIN / CREATININE URINE RATIO
CREATININE, URINE: 220 mg/dL (ref 20–320)
MICROALB/CREAT RATIO: 7 ug/mg{creat} (ref <30)
Microalb, Ur: 1.5 mg/dL

## 2018-04-12 LAB — COMPLETE METABOLIC PANEL WITH GFR
AG RATIO: 1.7 (calc) (ref 1.0–2.5)
ALT: 26 U/L (ref 9–46)
AST: 28 U/L (ref 10–35)
Albumin: 4.6 g/dL (ref 3.6–5.1)
Alkaline phosphatase (APISO): 64 U/L (ref 40–115)
BILIRUBIN TOTAL: 1.9 mg/dL — AB (ref 0.2–1.2)
BUN: 19 mg/dL (ref 7–25)
CO2: 30 mmol/L (ref 20–32)
Calcium: 10.4 mg/dL — ABNORMAL HIGH (ref 8.6–10.3)
Chloride: 105 mmol/L (ref 98–110)
Creat: 1.1 mg/dL (ref 0.70–1.33)
GFR, Est African American: 86 mL/min/{1.73_m2} (ref >=60)
GFR, Est Non African American: 74 mL/min/{1.73_m2} (ref >=60)
Globulin: 2.7 g/dL (calc) (ref 1.9–3.7)
Glucose, Bld: 92 mg/dL (ref 65–99)
POTASSIUM: 4.2 mmol/L (ref 3.5–5.3)
Sodium: 141 mmol/L (ref 135–146)
Total Protein: 7.3 g/dL (ref 6.1–8.1)

## 2018-04-12 LAB — CBC WITH DIFFERENTIAL/PLATELET
BASOS ABS: 49 {cells}/uL (ref 0–200)
Basophils Relative: 0.7 %
Eosinophils Absolute: 112 cells/uL (ref 15–500)
Eosinophils Relative: 1.6 %
HEMATOCRIT: 43.4 % (ref 38.5–50.0)
Hemoglobin: 14.7 g/dL (ref 13.2–17.1)
Lymphs Abs: 2478 cells/uL (ref 850–3900)
MCH: 27.7 pg (ref 27.0–33.0)
MCHC: 33.9 g/dL (ref 32.0–36.0)
MCV: 81.7 fL (ref 80.0–100.0)
MPV: 11.6 fL (ref 7.5–12.5)
Monocytes Relative: 9.6 %
NEUTROS PCT: 52.7 %
Neutro Abs: 3689 cells/uL (ref 1500–7800)
PLATELETS: 224 10*3/uL (ref 140–400)
RBC: 5.31 10*6/uL (ref 4.20–5.80)
RDW: 13 % (ref 11.0–15.0)
TOTAL LYMPHOCYTE: 35.4 %
WBC: 7 10*3/uL (ref 3.8–10.8)
WBCMIX: 672 {cells}/uL (ref 200–950)

## 2018-04-12 LAB — URINALYSIS W MICROSCOPIC + REFLEX CULTURE
Bacteria, UA: NONE SEEN /HPF
Bilirubin Urine: NEGATIVE
Glucose, UA: NEGATIVE
HYALINE CAST: NONE SEEN /LPF
Hgb urine dipstick: NEGATIVE
Leukocyte Esterase: NEGATIVE
Nitrites, Initial: NEGATIVE
PROTEIN: NEGATIVE
RBC / HPF: NONE SEEN /HPF (ref 0–2)
SQUAMOUS EPITHELIAL / LPF: NONE SEEN /HPF (ref <=5)
Specific Gravity, Urine: 1.028 (ref 1.001–1.03)
pH: 6 (ref 5.0–8.0)

## 2018-04-12 LAB — LIPID PANEL
CHOLESTEROL: 170 mg/dL (ref <200)
HDL: 28 mg/dL — ABNORMAL LOW (ref >40)
LDL CHOLESTEROL (CALC): 112 mg/dL — AB
Non-HDL Cholesterol (Calc): 142 mg/dL (calc) — ABNORMAL HIGH (ref <130)
TRIGLYCERIDES: 186 mg/dL — AB (ref <150)
Total CHOL/HDL Ratio: 6.1 (calc) — ABNORMAL HIGH (ref <5.0)

## 2018-04-12 LAB — NO CULTURE INDICATED

## 2018-04-12 LAB — VITAMIN D 25 HYDROXY (VIT D DEFICIENCY, FRACTURES): Vit D, 25-Hydroxy: 54 ng/mL (ref 30–100)

## 2018-04-12 LAB — HEMOGLOBIN A1C
HEMOGLOBIN A1C: 6.6 %{Hb} — AB (ref <5.7)
Mean Plasma Glucose: 143 (calc)
eAG (mmol/L): 7.9 (calc)

## 2018-04-12 LAB — PSA: PSA: 2.1 ng/mL (ref <=4.0)

## 2018-04-12 LAB — TSH: TSH: 1.46 mIU/L (ref 0.40–4.50)

## 2018-04-17 ENCOUNTER — Other Ambulatory Visit: Payer: Self-pay | Admitting: Internal Medicine

## 2018-04-17 DIAGNOSIS — I10 Essential (primary) hypertension: Secondary | ICD-10-CM

## 2018-07-17 ENCOUNTER — Other Ambulatory Visit: Payer: Self-pay | Admitting: Internal Medicine

## 2018-07-17 ENCOUNTER — Encounter: Payer: Self-pay | Admitting: Neurology

## 2018-07-17 DIAGNOSIS — I10 Essential (primary) hypertension: Secondary | ICD-10-CM

## 2018-07-18 ENCOUNTER — Encounter: Payer: Self-pay | Admitting: Neurology

## 2018-07-18 ENCOUNTER — Ambulatory Visit: Payer: BLUE CROSS/BLUE SHIELD | Admitting: Neurology

## 2018-07-18 VITALS — BP 117/72 | HR 52 | Ht 71.0 in | Wt 262.0 lb

## 2018-07-18 DIAGNOSIS — Z9989 Dependence on other enabling machines and devices: Secondary | ICD-10-CM

## 2018-07-18 DIAGNOSIS — G4733 Obstructive sleep apnea (adult) (pediatric): Secondary | ICD-10-CM

## 2018-07-18 NOTE — Patient Instructions (Signed)

## 2018-07-18 NOTE — Progress Notes (Signed)
SLEEP MEDICINE CLINIC   Provider:  Melvyn Novas, M D  Referring Provider: Lucky Cowboy, MD Primary Care Physician:  Lucky Cowboy, MD  Chief Complaint  Patient presents with  . Follow-up    pt alone, rm 11. pt here to discuss his machine and see about the pressure. the patient has been out of the state and is returning  to get set up with CPAP care and supplies. pt brought the machine today it is over 57 years old. i was able to get a download from the machine by using one of our cards. the machine is not downloading on his chip and gives error reading stating invalid card when placing the chip in. advised the patient to follow up with [facility]: Lambert Rhonda W on this issue    HPI:  Larry Hudson is a 57 y.o. male pateint who is seen here in a Rv after an original referral from 2017 via  Dr. Oneta Rack for Sleep consultation.  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  I was able to obtain his original sleep study from [facility] center dated 22 January 2012.  At the time he was diagnosed with poor sleep efficiency he only slept 51% of the total recorded time, oxygen nadir was 85% lowest heart rate was 48 bpm highest heart rate was 60 which is still quite low, he was titrated to 14 cmH2O pressure at the time with an AHI of 0.55/h.  His original AHI was only 8.07/h I am sorry his original AHI was 29.1/h the RDI indicating loud snoring, was 32.9/h during REM sleep there was a remarkably lower AHI of only 8.7.  My concern is that the patient may have a mixture of central and obstructive apneas but his download shows very good CPAP compliance CPAP is now set at 18 cmH2O with 3 cm EPR, and his residual AHI is 1.7 as  of 17 July 2018.  No central apneas arising here no major air leaks but he does have trouble to put his Mirage fullface mask on tight enough.  He probably needs every 6 months new headgear.  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  his turbinate reduction by now over 7 years ago has allowed him to have nasal patency, he will continue with a Edwena Felty and wide the largest size of a full facemask we have available. He has been highly compliant with CPAP use.  I would like for the patient endorsed the Epworth sleepiness score at 4 points and the fatigue severity score at 17 points to get regular headgear, masks and tubing and filter.    2017 visit : [name] presents as a compliance CPAP user in urgent need of new supplies for his CPAP.  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 His work however is physically demanding as he unloads and loads his truck. He transfers laundry from medical facilities. The packages he has to lift are very heavy. About 5 years ago was the last time he got a new mask is no longer working of course. He was [facility] [place] [facility] [address] for his sleep study a little more than 5 years ago, there has been no follow-up from that institution. I also have a overnight pulse oximetry available a report dated 08/23/2009 and the patient was tilted the proximal oximetry was not concerning for sleep apnea. I do not have a copy of his original sleep study which happily followed. I do not know to what degree of apnea was found that CPAP was initiated. The patient does not know the settings and also his machine is still working he  needs urgently new supplies as mentioned.  His wife had noted prior to his sleep evaluation that he stopped breathing at night and she was very bothered by his loud snoring. Everybody in the house heard him snore he states. This changed when CPAP was initiated. I have a download available here just for 1 night so that we can see what the machine is set at, it is set at 18 cm water with 3 cm EPR, which is a very high pressure. His AHI was 2.3. Since he is comfortable using them machine at this pressure I will not need to change it at his CPAP machine works well as long as he has supplies. At this time there is no need for new machine, since he paid this one off.   Chief complaint according to patient : I don't sleep as well with the broken mask .   Sleep habits are as follows: He will go to bed at about 11 PM, usually asleep very promptly. He is always asleep by 12 midnight he states. Sometimes he will go to the bathroom once and usually falls asleep again. He usually doesn't have any further bathroom breaks. His wife has to wake him at 6:30 he is usually not spontaneously awake at that time he will drive usually between the hours of 8 AM through 7 PM. He works 4 days a week. He describes his bedroom is cool, quiet and dark. He turns a lot at night, he can usually not stay asleep in supine position. He usually likes to sleep on 2 pillows some nights on 1. He has been a habitual mouth breather for most of his life and is chronically congested. He had a septoplasty / turbinate reduction, it did not work.   Sleep medical history and family sleep history:  No family history.  Social history: married, with one son , has grandchildren. Originally from [place].   07-18-2018 he recently had his 17 year old niece , working on a CNA degree - she introduced more sugary foods, junk foods- and he gained a lot of weight.  - Used to drink more alcohol, but the last 4 years he lost weight by cutting it out.  Crystal light 3 a day.  Caffeine intake -  44 ounces of coffee, no longer drinks hot tea, no Sodas. Smoker, none- Quit over 12 years ago.   Review of Systems: Out of a complete 14 system review, the patient complains of only the following symptoms, and all other reviewed systems are negative. Epworth score 4 from 6 points , Fatigue severity score down to  17 from 50  , depression score    Social History   Socioeconomic History  . Marital status: Married    Spouse name: Not on file  . Number of children: Not on file  . Years of education: Not on file  . Highest education level: Not on file  Occupational History  . Not on file  Social Needs  . Financial resource strain: Not on file  . Food insecurity:    Worry: Not on file    Inability: Not on file  . Transportation needs:    Medical: Not on file    Non-medical: Not on file  Tobacco Use  . Smoking status: Former Smoker    Last attempt to quit: 02/03/1996    Years since quitting: 22.4  . Smokeless tobacco: Never Used  Substance and Sexual Activity  . Alcohol use: No    Alcohol/week: 0.0 standard drinks  . Drug use: No  . Sexual activity: Not on file  Lifestyle  . Physical activity:    Days per week: Not on file    Minutes per session: Not on file  . Stress: Not on file  Relationships  . Social connections:    Talks on phone: Not on file    Gets together: Not on file    Attends religious service: Not on file    Active member of club or organization: Not on file    Attends meetings of clubs or organizations: Not on file    Relationship status: Not on file  . Intimate partner violence:    Fear of current or ex partner: Not on file    Emotionally abused: Not on file    Physically abused: Not on file    Forced sexual activity: Not on file  Other Topics Concern  . Not on file  Social History Narrative  . Not on file    Family History  Problem Relation Age of Onset  . Hyperlipidemia Mother   . Heart attack Mother     . Heart disease Father   . Emphysema Father     Past Medical History:  Diagnosis Date  . Hyperlipidemia   . Hypertension   . Prediabetes   . Vitamin D deficiency     Past Surgical History:  Procedure Laterality Date  . HEMORRHOID BANDING  2013  . NASAL SEPTUM SURGERY  2004    Current Outpatient Medications  Medication Sig Dispense Refill  . atorvastatin (LIPITOR) 80 MG tablet TAKE 1 TABLET BY MOUTH ONCE DAILY AT 6 PM 90 tablet 1  . BABY ASPIRIN PO Take 81 mg by mouth daily.    . bisoprolol-hydrochlorothiazide (ZIAC) 5-6.25 MG tablet TAKE 1 TABLET BY MOUTH ONCE DAILY FOR BLOOD PRESSURE 90 tablet 0  . Cholecalciferol (VITAMIN D PO) Take 5,000 Int'l Units by mouth daily.    . Cyanocobalamin (VITAMIN B-12 PO) Take 1 tablet by mouth daily.    Marland Kitchen ezetimibe (ZETIA) 10 MG tablet TAKE 1 TABLET BY MOUTH ONCE DAILY 90 tablet 1  . Omega-3 Fatty Acids (FISH OIL PO) Take 1,200 mg by mouth daily.    Marland Kitchen triamcinolone ointment (KENALOG) 0.1 % Apply 1 application topically 2 (two) times daily. 80 g 1   No current facility-administered medications for this visit.     Allergies as of 07/18/2018  . (No Known Allergies)    Vitals: BP 117/72   Pulse (!) 52   Ht 5\' 11"  (1.803 m)   Wt 262 lb (118.8 kg)   BMI  36.54 kg/m  Last Weight:  Wt Readings from Last 1 Encounters:  07/18/18 262 lb (118.8 kg)   ZOX:WRUE mass index is 36.54 kg/m.     Last Height:   Ht Readings from Last 1 Encounters:  07/18/18 5\' 11"  (1.803 m)    Physical exam:  General: The patient is awake, alert and appears not in acute distress. The patient is well groomed. Head: Normocephalic, atraumatic. Neck is supple. Mallampati status post ENTsurgery  neck circumference: 19. 75". Nasal airflow congested - Retrognathia is not seen.  Cardiovascular:  Regular rate and rhythm , without  murmurs or carotid bruit, and without distended neck veins. Respiratory: Lungs are clear to auscultation. Skin:  Without evidence of edema,  or rash Trunk: BMI is elevated at 36.54 . The patient's posture is erect. Neck size is large.   Neurologic exam : The patient is awake and alert, oriented to place and time.    Speech is fluent, with dysphonia .  Mood and affect are appropriate.  Cranial nerves: Pupils are equal and briskly reactive to light. Extraocular movements  in vertical and horizontal planes intact and without nystagmus. Visual fields by finger perimetry are intact. Hearing impaired when in crowded room .Facial sensation intact to fine touch. Facial motor strength is symmetric and tongue and uvula move midline. Shoulder shrug was symmetrical.   Motor exam:   Normal tone, muscle bulk and symmetric strength in all extremities.  Sensory:  Fine touch, pinprick and vibration were normal.  Coordination: Rapid alternating movements in the fingers/hands was normal.  Finger-to-nose maneuver  normal without evidence of ataxia, dysmetria or tremor.  Gait and station: Patient walks without assistive device and is able unassisted to climb up to the exam table. Strength within normal limits.  Stance is stable and normal. Tandem gait is unfragmented. Deep tendon reflexes: in the upper and lower extremities are symmetric and intact. Babinski maneuver response is downgoing.  The patient was advised of the nature of the diagnosed sleep disorder , the treatment options and risks for general a health and wellness arising from not treating the condition.  I spent more than 45 minutes of face to face time with the patient. Greater than 50% of time was spent in counseling and coordination of care. We have discussed the diagnosis and differential and I answered the patient's questions.     Assessment:  After physical and neurologic examination, review of laboratory studies,  Personal review of imaging studies, reports of other /same  Imaging studies ,  Results of polysomnography/ neurophysiology testing and pre-existing records as far as  provided in visit., my assessment is   1) I the pleasure of seeing Mr. Prine today, we'll has been successfully treated for obstructive sleep apnea with CPAP for over 5 years. I was also able to see a pulse oximetry from before he was placed on CPAP. I do not have access at this time to his original sleep study. Mr. she Richarda Osmond needs urgently new supplies including tubing, filter headgear and mask. He prefers a full face mask. Since he was last prescribed in interface the choice of models have greatly improved. He likes to stay for now on his current full face mask, mirage quattro, wide.   2) the patient has undergone a turbinate reduction over 5 years ago, this was not UPPP procedure. His current upper airway is still very narrow the uvula lifts barely above the tongue ground.  I have no doubt that the patient still has sleep apnea  given that he feels much better when he can use CPAP and I will allow him to get his supplies on an urgent basis. I would like to send him to either advanced home care - [place].      Plan:  Treatment plan and additional workup :  Every year, at this time the patient has again to  re-establish compliance. He is doing well . He is is due for a new machine ( this one issued in 2013) , and he reports needing more often new headgear. I will order an autotitration device 6-20 cm water, 2 cm EPR.   He needs a new mask to become compliant however -large / wide mirage Quattro maskPorfirio Mylar Yoan Sallade MD  07/18/2018   CC: Lucky Cowboy, Md [address] [place], [place] [address]

## 2018-07-24 ENCOUNTER — Other Ambulatory Visit: Payer: Self-pay | Admitting: Internal Medicine

## 2018-07-24 DIAGNOSIS — E782 Mixed hyperlipidemia: Secondary | ICD-10-CM

## 2018-07-26 DIAGNOSIS — G4733 Obstructive sleep apnea (adult) (pediatric): Secondary | ICD-10-CM | POA: Diagnosis not present

## 2018-08-01 ENCOUNTER — Ambulatory Visit: Payer: Self-pay | Admitting: Adult Health

## 2018-08-08 ENCOUNTER — Ambulatory Visit: Payer: BLUE CROSS/BLUE SHIELD | Admitting: Neurology

## 2018-08-08 DIAGNOSIS — Z9989 Dependence on other enabling machines and devices: Secondary | ICD-10-CM

## 2018-08-08 DIAGNOSIS — G4733 Obstructive sleep apnea (adult) (pediatric): Secondary | ICD-10-CM | POA: Diagnosis not present

## 2018-08-14 NOTE — Addendum Note (Signed)
Addended by: Melvyn Novas on: 08/14/2018 12:36 PM   Modules accepted: Orders

## 2018-08-14 NOTE — Procedures (Signed)
NAME:   [name]                                                               DOB: 1961/05/09 MEDICAL RECORD [id number]                                              DOS:  08/08/2018 REFERRING PHYSICIAN: Lucky Cowboy, MD STUDY PERFORMED: Home Sleep Study on Watch Pat HISTORY: Larry Hudson is a 57 y.o. male patient seen on 07-18-2018 after original referral from 2017 via Dr. Oneta Rack .  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  I was able to obtain his original sleep study from [facility] dated 22 January 2012.  At the time he was diagnosed with poor sleep efficiency ( 51% of the total recorded time), oxygen nadir was 85% , heart rate was 48 bpm - 60 bpm, he was titrated to 14 cmH2O pressure at the time with an AHI of 0.55/h.  His original AHI was 29.1/h (with loud snoring), RDI 32.9/h, REM AHI remarkably lower - only 8.7/h.  My concern is that the patient may have a mixture of central and obstructive apneas but his download shows very good CPAP compliance CPAP is now set at 18 cmH2O with 3 cm EPR, and his residual AHI is 1.7 as of 17 July 2018.  No central apneas arising, neither major air leaks. He has trouble to put his Mirage full-face mask on tight enough.    BMI: 36.7; Epworth 4  STUDY RESULTS:  Total Recording Time: 10 hours 4 minutes, Valid 8 h 51 min. Total Apnea/Hypopnea Index (AHI):  33.8 /h; RDI:  35.3 /h Average Oxygen Saturation:   95 %; Lowest Oxygen Saturation: 88 %.  Total Time Oxygen Saturation below 89 %:  0.1 minutes.  Average Heart Rate:  47 bpm (between 39 and 74 bpm). IMPRESSION: Confirmed presence of severe OSA - Obstructive Sleep Apnea, at AHI of 33.8/h, Again REM  AHI lower at 23.7/h, in spite of sleeping supine for much of REM. No prolonged hypoxemia. Bradycardia noted.  RECOMMENDATION: Continue CPAP use. Will switch to autotitration capable CPAP 7-20 cm water, with 2 cm EPR, heated humidity and mask of patients choice and comfort. Weight loss is needed to reduce apnea and snoring further. I certify that I have reviewed the raw data recording prior to the issuance of this report in accordance with the standards of the American Academy of Sleep Medicine (AASM). Melvyn Novas, M.D.    08-14-2018    Medical Director of [facility] at [facility], accredited by the AASM. Diplomat of the ABPN and ABSM.

## 2018-08-15 ENCOUNTER — Telehealth: Payer: Self-pay | Admitting: *Deleted

## 2018-08-15 NOTE — Telephone Encounter (Signed)
-----   Message from Melvyn Novas, MD sent at 08/14/2018 12:36 PM EDT ----- IMPRESSION: Confirmed presence of severe OSA - Obstructive Sleep  Apnea, at AHI of 33.8/h, Again REM AHI lower at 23.7/h, in spite  of sleeping supine for much of REM. No prolonged hypoxemia.  Bradycardia noted.  RECOMMENDATION: Continue CPAP use. Will switch to autotitration  capable CPAP 7-20 cm water, with 2 cm EPR, heated humidity and  mask of patients choice and comfort. Weight loss is needed to  reduce apnea and snoring further.

## 2018-08-15 NOTE — Telephone Encounter (Signed)
Called pt and LVM asking for call back.  

## 2018-08-20 NOTE — Progress Notes (Deleted)
Assessment and Plan:  Hypertension:  -RESTART medication,  -monitor blood pressure at home.  -Continue DASH diet.   -Reminder to go to the ER if any CP, SOB, nausea, dizziness, severe HA, changes vision/speech, left arm numbness and tingling, and jaw pain.  Cholesterol: -Continue diet and exercise.  -Check cholesterol.   Pre-diabetes: -Continue diet and exercise.  -Check A1C  Vitamin D Def: -continue medications.   Morbid Obesity with co morbidities - long discussion about weight loss, diet, and exercise  Hematuria, unspecified type ? Stone, may need KUB -     Urinalysis, Routine w reflex microscopic -     Urine culture   Continue diet and meds as discussed. Further disposition pending results of labs.  HPI 57 y.o. male  presents for 3 month follow up with hypertension, hyperlipidemia, prediabetes and vitamin D.   His blood pressure has not been controlled at home, has been out of ziac x several day, today their BP is  .   He does not workout. He denies chest pain, shortness of breath, dizziness, headaches, blurry vision, weakness.    He is on cholesterol medication, he is atorvastatin 80mg  and zetia 10mg  and denies myalgias. His cholesterol is not at goal. The cholesterol last visit was:   Lab Results  Component Value Date   CHOL 170 04/11/2018   HDL 28 (L) 04/11/2018   LDLCALC 112 (H) 04/11/2018   TRIG 186 (H) 04/11/2018   CHOLHDL 6.1 (H) 04/11/2018   Possibly passed a stone, had burning and passed black thing, has been urinating better.   He has been working on diet and exercise for prediabetes, and denies foot ulcerations, hyperglycemia, hypoglycemia , increased appetite, nausea, paresthesia of the feet, polydipsia, polyuria, visual disturbances, vomiting and weight loss. Last A1C in the office was:  Lab Results  Component Value Date   HGBA1C 6.6 (H) 04/11/2018   Patient is on Vitamin D supplement.  Lab Results  Component Value Date   VD25OH 33 04/11/2018     Wife lost her sister and has been crying, got her citizenship last week  BMI is There is no height or weight on file to calculate BMI., he is working on diet and exercise. He is on CPAP for OSA.  Wt Readings from Last 3 Encounters:  07/18/18 262 lb (118.8 kg)  04/11/18 264 lb (119.7 kg)  08/02/17 262 lb 12.8 oz (119.2 kg)     Current Medications:  Current Outpatient Medications on File Prior to Visit  Medication Sig Dispense Refill  . atorvastatin (LIPITOR) 80 MG tablet TAKE 1 TABLET BY MOUTH ONCE DAILY AT  6PM 90 tablet 1  . BABY ASPIRIN PO Take 81 mg by mouth daily.    . bisoprolol-hydrochlorothiazide (ZIAC) 5-6.25 MG tablet TAKE 1 TABLET BY MOUTH ONCE DAILY FOR BLOOD PRESSURE 90 tablet 0  . Cholecalciferol (VITAMIN D PO) Take 5,000 Int'l Units by mouth daily.    . Cyanocobalamin (VITAMIN B-12 PO) Take 1 tablet by mouth daily.    Marland Kitchen ezetimibe (ZETIA) 10 MG tablet TAKE 1 TABLET BY MOUTH ONCE DAILY 90 tablet 1  . Omega-3 Fatty Acids (FISH OIL PO) Take 1,200 mg by mouth daily.    Marland Kitchen triamcinolone ointment (KENALOG) 0.1 % Apply 1 application topically 2 (two) times daily. 80 g 1   No current facility-administered medications on file prior to visit.     Medical History:  Past Medical History:  Diagnosis Date  . Hyperlipidemia   . Hypertension   .  Prediabetes   . Vitamin D deficiency     Allergies: No Known Allergies   Review of Systems:  Review of Systems  Constitutional: Negative for chills, fever and malaise/fatigue.  HENT: Negative for congestion, ear pain and sore throat.   Eyes: Negative.   Respiratory: Negative for cough, shortness of breath and wheezing.   Cardiovascular: Negative for chest pain, palpitations and leg swelling.  Gastrointestinal: Negative for abdominal pain, blood in stool, constipation, diarrhea, heartburn and melena.  Genitourinary: Negative.   Skin: Negative.   Neurological: Negative for dizziness, sensory change, loss of consciousness and  headaches.  Psychiatric/Behavioral: Negative for depression. The patient is nervous/anxious and has insomnia.     Family history- Review and unchanged  Social history- Review and unchanged  Physical Exam: There were no vitals taken for this visit. Wt Readings from Last 3 Encounters:  07/18/18 262 lb (118.8 kg)  04/11/18 264 lb (119.7 kg)  08/02/17 262 lb 12.8 oz (119.2 kg)    General Appearance: Well nourished well developed, in no apparent distress. Eyes: PERRLA, EOMs, conjunctiva no swelling or erythema ENT/Mouth: Ear canals normal without obstruction, swelling, erythma, discharge.  TMs normal bilaterally.  Oropharynx moist, clear, without exudate, or postoropharyngeal swelling. Neck: Supple, thyroid normal,no cervical adenopathy  Respiratory: Respiratory effort normal, Breath sounds clear A&P without rhonchi, wheeze, or rale.  No retractions, no accessory usage. Cardio: RRR with no MRGs. Brisk peripheral pulses without edema.  Abdomen: Soft, + BS,  Non tender, no guarding, rebound, hernias, masses. Musculoskeletal: Full ROM, 5/5 strength, Normal gait Skin: Warm, dry without rashes, lesions, ecchymosis.  Neuro: Awake and oriented X 3, Cranial nerves intact. Normal muscle tone, no cerebellar symptoms. Psych: Normal affect, Insight and Judgment appropriate.    Quentin Mulling, PA-C 3:00 PM [facility]

## 2018-08-22 ENCOUNTER — Ambulatory Visit: Payer: Self-pay | Admitting: Physician Assistant

## 2018-08-23 NOTE — Telephone Encounter (Signed)
Called the pt reviewed the sleep study results with him. Advised him that Dr Vickey Huger wanted him to continue to use the CPAP. Patient has taken his machine to [facility] and got the chip replaced otherwise he states the machine is working well. Unsure if his machine is auto capable but if it is the doctor wants to change to auto cpap 7-20 cm water pressure. Pt verbalized understanding. Advised the patient that I will send the orders to [facility] and let them know so I can make that change. The patient states the mask he got from them is too small. Advised the pt to advise the [facility] person when they call about that issue so they can help make sure he gets the right fit. Informed the patient that he should make a apt in a year to follow up yearly for cpap supplies and needs. Pt verbalized understanding. Pt had no questions at this time but was encouraged to call back if questions arise.

## 2018-10-05 NOTE — Progress Notes (Signed)
FOLLOW UP  Assessment and Plan:   Hypertension Well controlled with current medications  Monitor blood pressure at home; patient to call if consistently greater than 130/80 Continue DASH diet.   Reminder to go to the ER if any CP, SOB, nausea, dizziness, severe HA, changes vision/speech, left arm numbness and tingling and jaw pain.  Cholesterol Currently above goal; may switch to rosuvastatin pending LDL results Continue low cholesterol diet and exercise.  Check lipid panel.   Prediabetes Recheck A1C, if remains <6.5 initiate metformin Continue diet and exercise.  Perform daily foot/skin check, notify office of any concerning changes.  Check A1C  Morbid obesity with co morbidities Long discussion about weight loss, diet, and exercise Recommended diet heavy in fruits and veggies and low in animal meats, cheeses, and dairy products, appropriate calorie intake Discussed ideal weight for height and initial weight goal (250lb) Patient will work on cutting out oatmeal cookies, fig newtons, coffee creamer Will start the patient on phentermine- hand out given and AE's discussed, will do close follow up. Will follow up in 3 months  Vitamin D Def Near goal at last visit;  continue supplementation to maintain goal of 70-100 Defer Vit D level   Continue diet and meds as discussed. Further disposition pending results of labs. Discussed med's effects and SE's.   Over 30 minutes of exam, counseling, chart review, and critical decision making was performed.   Future Appointments  Date Time Provider Department Center  04/17/2019 10:00 AM Quentin Mulling, PA-C [id number] None  07/24/2019 10:30 AM Dohmeier, Porfirio Mylar, MD [facility] None    ----------------------------------------------------------------------------------------------------------------------  HPI 57 y.o. male  presents for 3 month follow up on hypertension, cholesterol, pre/diabetes, morbid obesity and vitamin D deficiency. OSA on  CPAP and followed by Dr. Vickey Huger.  BMI is Body mass index is 36.96 kg/m., he has been working on diet, doesn't exercise, but works a physically intense job. He has cut out pastries and salted peanuts. He avoids red meat, eats chicken and some fish, bananas, apples, carrots, celery for snacks, sandwhichs at work. He does admits to daily oatmeal cookies, fig newtons and uses sweetened creamer which he suspects is a problem.  Wt Readings from Last 3 Encounters:  10/10/18 265 lb (120.2 kg)  07/18/18 262 lb (118.8 kg)  04/11/18 264 lb (119.7 kg)   His blood pressure has been controlled at home, today their BP is BP: 110/80  He does not workout. He denies chest pain, shortness of breath, dizziness.   He is on cholesterol medication Atorvastatin, Zetia and omega 3 and denies myalgias. His cholesterol is not at goal. The cholesterol last visit was:   Lab Results  Component Value Date   CHOL 170 04/11/2018   HDL 28 (L) 04/11/2018   LDLCALC 112 (H) 04/11/2018   TRIG 186 (H) 04/11/2018   CHOLHDL 6.1 (H) 04/11/2018    He has been working on diet and exercise for prediabetes, and denies increased appetite, nausea, paresthesia of the feet, polydipsia, polyuria and visual disturbances. Last A1C in the office was:  Lab Results  Component Value Date   HGBA1C 6.6 (H) 04/11/2018   Patient is on Vitamin D supplement.   Lab Results  Component Value Date   VD25OH 54 04/11/2018        Current Medications:  Current Outpatient Medications on File Prior to Visit  Medication Sig  . atorvastatin (LIPITOR) 80 MG tablet TAKE 1 TABLET BY MOUTH ONCE DAILY AT  6PM  . BABY ASPIRIN PO Take  81 mg by mouth daily.  . bisoprolol-hydrochlorothiazide (ZIAC) 5-6.25 MG tablet TAKE 1 TABLET BY MOUTH ONCE DAILY FOR BLOOD PRESSURE  . Cholecalciferol (VITAMIN D PO) Take 5,000 Int'l Units by mouth daily.  . Cyanocobalamin (VITAMIN B-12 PO) Take 1 tablet by mouth daily.  Marland Kitchen. ezetimibe (ZETIA) 10 MG tablet TAKE 1 TABLET BY  MOUTH ONCE DAILY  . Omega-3 Fatty Acids (FISH OIL PO) Take 1,200 mg by mouth daily.  Marland Kitchen. triamcinolone ointment (KENALOG) 0.1 % Apply 1 application topically 2 (two) times daily.   No current facility-administered medications on file prior to visit.      Allergies: No Known Allergies   Medical History:  Past Medical History:  Diagnosis Date  . Hyperlipidemia   . Hypertension   . Prediabetes   . Vitamin D deficiency    Family history- Reviewed and unchanged Social history- Reviewed and unchanged   Review of Systems:  Review of Systems  Constitutional: Negative for malaise/fatigue and weight loss.  HENT: Negative for hearing loss and tinnitus.   Eyes: Negative for blurred vision and double vision.  Respiratory: Negative for cough, shortness of breath and wheezing.   Cardiovascular: Negative for chest pain, palpitations, orthopnea, claudication and leg swelling.  Gastrointestinal: Negative for abdominal pain, blood in stool, constipation, diarrhea, heartburn, melena, nausea and vomiting.  Genitourinary: Negative.   Musculoskeletal: Negative for joint pain and myalgias.  Skin: Negative for rash.  Neurological: Negative for dizziness, tingling, sensory change, weakness and headaches.  Endo/Heme/Allergies: Negative for polydipsia.  Psychiatric/Behavioral: Negative.   All other systems reviewed and are negative.     Physical Exam: BP 110/80   Pulse 62   Temp (!) 97.3 F (36.3 C)   Ht 5\' 11"  (1.803 m)   Wt 265 lb (120.2 kg)   SpO2 97%   BMI 36.96 kg/m  Wt Readings from Last 3 Encounters:  10/10/18 265 lb (120.2 kg)  07/18/18 262 lb (118.8 kg)  04/11/18 264 lb (119.7 kg)   General Appearance: Well nourished, in no apparent distress. Eyes: PERRLA, EOMs, conjunctiva no swelling or erythema Sinuses: No Frontal/maxillary tenderness ENT/Mouth: Ext aud canals clear, TMs without erythema, bulging. No erythema, swelling, or exudate on post pharynx.  Tonsils not swollen or  erythematous. Hearing normal.  Neck: Supple, thyroid normal.  Respiratory: Respiratory effort normal, BS equal bilaterally without rales, rhonchi, wheezing or stridor.  Cardio: RRR with no MRGs. Brisk peripheral pulses without edema.  Abdomen: Soft, + BS.  Non tender, no guarding, rebound, hernias, masses. Lymphatics: Non tender without lymphadenopathy.  Musculoskeletal: Full ROM, 5/5 strength, Normal gait Skin: Warm, dry without rashes, lesions, ecchymosis.  Neuro: Cranial nerves intact. No cerebellar symptoms.  Psych: Awake and oriented X 3, normal affect, Insight and Judgment appropriate.    Dan MakerAshley C Haizlee Henton, NP 10:50 AM [facility]

## 2018-10-10 ENCOUNTER — Ambulatory Visit: Payer: BLUE CROSS/BLUE SHIELD | Admitting: Adult Health

## 2018-10-10 ENCOUNTER — Encounter: Payer: Self-pay | Admitting: Adult Health

## 2018-10-10 VITALS — BP 110/80 | HR 62 | Temp 97.3°F | Ht 71.0 in | Wt 265.0 lb

## 2018-10-10 DIAGNOSIS — E559 Vitamin D deficiency, unspecified: Secondary | ICD-10-CM

## 2018-10-10 DIAGNOSIS — E782 Mixed hyperlipidemia: Secondary | ICD-10-CM | POA: Diagnosis not present

## 2018-10-10 DIAGNOSIS — Z23 Encounter for immunization: Secondary | ICD-10-CM

## 2018-10-10 DIAGNOSIS — R7303 Prediabetes: Secondary | ICD-10-CM | POA: Diagnosis not present

## 2018-10-10 DIAGNOSIS — I10 Essential (primary) hypertension: Secondary | ICD-10-CM

## 2018-10-10 DIAGNOSIS — Z79899 Other long term (current) drug therapy: Secondary | ICD-10-CM

## 2018-10-10 MED ORDER — PHENTERMINE HCL 37.5 MG PO TABS: ORAL_TABLET | ORAL | 2 refills | Status: DC

## 2018-10-10 NOTE — Patient Instructions (Addendum)
Goals    . DIET - REDUCE SUGAR INTAKE    . Weight (lb) < 250 lb (113.4 kg)       Try to cut down on DAILY sugar - oatmeal cookies, fig newtons, coffee creamer  Recommend cutting down on sugar intake  - the American Heart Association recommends no more than 9 teaspoons (38 g) of added sugar for men daily, and 6 teaspoons (25 g) for women. Added sugar can be in many things that you might not expect - salad dressings, bread that is not home made, "all natural" fruit juice, etc., most processed foods contain hidden sugars. Consider looking at labels and being aware of how much sugar you are consuming in a day. Less is always better for sugar; sugar reduces your body's immune response, and damages blood vessels, leading to increased risk of many diseases.     Phentermine tablets or capsules What is this medicine? PHENTERMINE (FEN ter meen) decreases your appetite. It is used with a reduced calorie diet and exercise to help you lose weight. This medicine may be used for other purposes; ask your health care provider or pharmacist if you have questions. COMMON BRAND NAME(S): Adipex-P, Atti-Plex P, Atti-Plex P Spansule, Fastin, Lomaira, Pro-Fast, Tara-8 What should I tell my health care provider before I take this medicine? They need to know if you have any of these conditions: -agitation -glaucoma -heart disease -high blood pressure -history of substance abuse -lung disease called Primary Pulmonary Hypertension (PPH) -taken an MAOI like Carbex, Eldepryl, Marplan, Nardil, or Parnate in last 14 days -thyroid disease -an unusual or allergic reaction to phentermine, other medicines, foods, dyes, or preservatives -pregnant or trying to get pregnant -breast-feeding How should I use this medicine? Take this medicine by mouth with a glass of water. Follow the directions on the prescription label. The instructions for use may differ based on the product and dose you are taking. Avoid taking this  medicine in the evening. It may interfere with sleep. Take your doses at regular intervals. Do not take your medicine more often than directed. Talk to your pediatrician regarding the use of this medicine in children. While this drug may be prescribed for children 17 years or older for selected conditions, precautions do apply. Overdosage: If you think you have taken too much of this medicine contact a poison control center or emergency room at once. NOTE: This medicine is only for you. Do not share this medicine with others. What if I miss a dose? If you miss a dose, take it as soon as you can. If it is almost time for your next dose, take only that dose. Do not take double or extra doses. What may interact with this medicine? Do not take this medicine with any of the following medications: -duloxetine -MAOIs like Carbex, Eldepryl, Marplan, Nardil, and Parnate -medicines for colds or breathing difficulties like pseudoephedrine or phenylephrine -procarbazine -sibutramine -SSRIs like citalopram, escitalopram, fluoxetine, fluvoxamine, paroxetine, and sertraline -stimulants like dexmethylphenidate, methylphenidate or modafinil -venlafaxine This medicine may also interact with the following medications: -medicines for diabetes This list may not describe all possible interactions. Give your health care provider a list of all the medicines, herbs, non-prescription drugs, or dietary supplements you use. Also tell them if you smoke, drink alcohol, or use illegal drugs. Some items may interact with your medicine. What should I watch for while using this medicine? Notify your physician immediately if you become short of breath while doing your normal activities. Do not take  this medicine within 6 hours of bedtime. It can keep you from getting to sleep. Avoid drinks that contain caffeine and try to stick to a regular bedtime every night. This medicine was intended to be used in addition to a healthy diet  and exercise. The best results are achieved this way. This medicine is only indicated for short-term use. Eventually your weight loss may level out. At that point, the drug will only help you maintain your new weight. Do not increase or in any way change your dose without consulting your doctor. You may get drowsy or dizzy. Do not drive, use machinery, or do anything that needs mental alertness until you know how this medicine affects you. Do not stand or sit up quickly, especially if you are an older patient. This reduces the risk of dizzy or fainting spells. Alcohol may increase dizziness and drowsiness. Avoid alcoholic drinks. What side effects may I notice from receiving this medicine? Side effects that you should report to your doctor or health care professional as soon as possible: -chest pain, palpitations -depression or severe changes in mood -increased blood pressure -irritability -nervousness or restlessness -severe dizziness -shortness of breath -problems urinating -unusual swelling of the legs -vomiting Side effects that usually do not require medical attention (report to your doctor or health care professional if they continue or are bothersome): -blurred vision or other eye problems -changes in sexual ability or desire -constipation or diarrhea -difficulty sleeping -dry mouth or unpleasant taste -headache -nausea This list may not describe all possible side effects. Call your doctor for medical advice about side effects. You may report side effects to FDA at 1-800-FDA-1088. Where should I keep my medicine? Keep out of the reach of children. This medicine can be abused. Keep your medicine in a safe place to protect it from theft. Do not share this medicine with anyone. Selling or giving away this medicine is dangerous and against the law. This medicine may cause accidental overdose and death if taken by other adults, children, or pets. Mix any unused medicine with a substance  like cat litter or coffee grounds. Then throw the medicine away in a sealed container like a sealed bag or a coffee can with a lid. Do not use the medicine after the expiration date. Store at room temperature between 20 and 25 degrees C (68 and 77 degrees F). Keep container tightly closed. NOTE: This sheet is a summary. It may not cover all possible information. If you have questions about this medicine, talk to your doctor, pharmacist, or health care provider.  2018 Elsevier/Gold Standard (2015-08-02 12:53:15)

## 2018-10-11 ENCOUNTER — Other Ambulatory Visit: Payer: Self-pay | Admitting: Adult Health

## 2018-10-11 MED ORDER — ROSUVASTATIN CALCIUM 40 MG PO TABS: 40.0000 mg | ORAL_TABLET | Freq: Every day | ORAL | 1 refills | Status: DC

## 2018-10-12 LAB — COMPLETE METABOLIC PANEL WITH GFR
AG RATIO: 1.7 (calc) (ref 1.0–2.5)
ALT: 27 U/L (ref 9–46)
AST: 24 U/L (ref 10–35)
Albumin: 4.5 g/dL (ref 3.6–5.1)
Alkaline phosphatase (APISO): 64 U/L (ref 40–115)
BUN: 12 mg/dL (ref 7–25)
CO2: 26 mmol/L (ref 20–32)
CREATININE: 1.09 mg/dL (ref 0.70–1.33)
Calcium: 9.8 mg/dL (ref 8.6–10.3)
Chloride: 105 mmol/L (ref 98–110)
GFR, Est African American: 87 mL/min/{1.73_m2} (ref >=60)
GFR, Est Non African American: 75 mL/min/{1.73_m2} (ref >=60)
GLOBULIN: 2.7 g/dL (ref 1.9–3.7)
Glucose, Bld: 120 mg/dL — ABNORMAL HIGH (ref 65–99)
POTASSIUM: 4.1 mmol/L (ref 3.5–5.3)
Sodium: 140 mmol/L (ref 135–146)
Total Bilirubin: 1.8 mg/dL — ABNORMAL HIGH (ref 0.2–1.2)
Total Protein: 7.2 g/dL (ref 6.1–8.1)

## 2018-10-12 LAB — HEMOGLOBIN A1C
Hgb A1c MFr Bld: 6.5 % of total Hgb — ABNORMAL HIGH (ref <5.7)
Mean Plasma Glucose: 140 (calc)
eAG (mmol/L): 7.7 (calc)

## 2018-10-12 LAB — CBC WITH DIFFERENTIAL/PLATELET
BASOS PCT: 0.6 %
Basophils Absolute: 39 cells/uL (ref 0–200)
EOS ABS: 117 {cells}/uL (ref 15–500)
Eosinophils Relative: 1.8 %
HCT: 45.7 % (ref 38.5–50.0)
Hemoglobin: 15.3 g/dL (ref 13.2–17.1)
Lymphs Abs: 1970 cells/uL (ref 850–3900)
MCH: 28.3 pg (ref 27.0–33.0)
MCHC: 33.5 g/dL (ref 32.0–36.0)
MCV: 84.5 fL (ref 80.0–100.0)
MPV: 12 fL (ref 7.5–12.5)
Monocytes Relative: 8.1 %
NEUTROS PCT: 59.2 %
Neutro Abs: 3848 cells/uL (ref 1500–7800)
PLATELETS: 218 10*3/uL (ref 140–400)
RBC: 5.41 10*6/uL (ref 4.20–5.80)
RDW: 13 % (ref 11.0–15.0)
TOTAL LYMPHOCYTE: 30.3 %
WBC mixed population: 527 cells/uL (ref 200–950)
WBC: 6.5 10*3/uL (ref 3.8–10.8)

## 2018-10-12 LAB — LIPID PANEL
CHOL/HDL RATIO: 5.9 (calc) — AB (ref <5.0)
CHOLESTEROL: 182 mg/dL (ref <200)
HDL: 31 mg/dL — AB (ref >40)
LDL Cholesterol (Calc): 119 mg/dL (calc) — ABNORMAL HIGH
NON-HDL CHOLESTEROL (CALC): 151 mg/dL — AB (ref <130)
TRIGLYCERIDES: 204 mg/dL — AB (ref <150)

## 2018-10-12 LAB — TSH: TSH: 1.98 m[IU]/L (ref 0.40–4.50)

## 2018-10-16 ENCOUNTER — Other Ambulatory Visit: Payer: Self-pay | Admitting: Internal Medicine

## 2018-10-16 DIAGNOSIS — I10 Essential (primary) hypertension: Secondary | ICD-10-CM

## 2018-11-07 ENCOUNTER — Ambulatory Visit: Payer: Self-pay | Admitting: Adult Health

## 2019-01-09 NOTE — Progress Notes (Deleted)
FOLLOW UP  Assessment and Plan:   Hypertension Well controlled with current medications  Monitor blood pressure at home; patient to call if consistently greater than 130/80 Continue DASH diet.   Reminder to go to the ER if any CP, SOB, nausea, dizziness, severe HA, changes vision/speech, left arm numbness and tingling and jaw pain.  Cholesterol Currently above goal; may switch to rosuvastatin pending LDL results Continue low cholesterol diet and exercise.  Check lipid panel.   Prediabetes Working on weight loss -  Continue diet and exercise.  Perform daily foot/skin check, notify office of any concerning changes.  Check A1C  Morbid obesity with co morbidities Long discussion about weight loss, diet, and exercise Recommended diet heavy in fruits and veggies and low in animal meats, cheeses, and dairy products, appropriate calorie intake Discussed ideal weight for height and initial weight goal (250lb) Patient will work on cutting out oatmeal cookies, fig newtons, coffee creamer Will start the patient on phentermine- hand out given and AE's discussed, will do close follow up. *** Will follow up in 3 months  Vitamin D Def Near goal at last visit;  continue supplementation to maintain goal of 70-100 Defer Vit D level   Continue diet and meds as discussed. Further disposition pending results of labs. Discussed med's effects and SE's.   Over 30 minutes of exam, counseling, chart review, and critical decision making was performed.   Future Appointments  Date Time Provider Department Center  01/10/2019  4:30 PM Judd Gaudier, NP [facility] None  04/17/2019 10:00 AM Quentin Mulling, PA-C [facility] None  07/24/2019 10:30 AM Dohmeier, Porfirio Mylar, MD [facility] None    ----------------------------------------------------------------------------------------------------------------------  HPI 58 y.o. male  presents for 3 month follow up on hypertension, cholesterol, pre/diabetes, morbid  obesity and vitamin D deficiency. OSA on CPAP and followed by Dr. Vickey Huger.  *** insurance?   he is prescribed phentermine for weight loss.  While on the medication they have lost {NUMBERS [id number]} lbs since last visit. They deny palpitations, anxiety, trouble sleeping, elevated BP.   BMI is There is no height or weight on file to calculate BMI., he *** working on diet. He doesn't exercise, but works a physically intense job. He has cut out pastries and salted peanuts. He avoids red meat, eats chicken and some fish, bananas, apples, carrots, celery for snacks, sandwhichs at work. He does admits to daily oatmeal cookies, fig newtons and uses sweetened creamer which he suspects is a problem.   Wt Readings from Last 3 Encounters:  10/10/18 265 lb (120.2 kg)  07/18/18 262 lb (118.8 kg)  04/11/18 264 lb (119.7 kg)   Typical breakfast: Typical lunch:  Typical dinner: Exercise:  Water intake:   His blood pressure has been controlled at home, today their BP is    He does not workout. He denies chest pain, shortness of breath, dizziness.   He is on cholesterol medication Atorvastatin, Zetia and omega 3 and denies myalgias. His cholesterol is not at goal. The cholesterol last visit was:   Lab Results  Component Value Date   CHOL 182 10/10/2018   HDL 31 (L) 10/10/2018   LDLCALC 119 (H) 10/10/2018   TRIG 204 (H) 10/10/2018   CHOLHDL 5.9 (H) 10/10/2018    He has been working on diet and exercise for prediabetes, and denies increased appetite, nausea, paresthesia of the feet, polydipsia, polyuria and visual disturbances. Last A1C in the office was:  Lab Results  Component Value Date   HGBA1C 6.5 (H) 10/10/2018  Patient is on Vitamin D supplement.   Lab Results  Component Value Date   VD25OH 54 04/11/2018        Current Medications:  Current Outpatient Medications on File Prior to Visit  Medication Sig  . BABY ASPIRIN PO Take 81 mg by mouth daily.  .  bisoprolol-hydrochlorothiazide (ZIAC) 5-6.25 MG tablet TAKE 1 TABLET BY MOUTH ONCE DAILY FOR BLOOD PRESSURE  . Cholecalciferol (VITAMIN D PO) Take 5,000 Int'l Units by mouth daily.  . Cyanocobalamin (VITAMIN B-12 PO) Take 1 tablet by mouth daily.  Marland Kitchen ezetimibe (ZETIA) 10 MG tablet TAKE 1 TABLET BY MOUTH ONCE DAILY  . Omega-3 Fatty Acids (FISH OIL PO) Take 1,200 mg by mouth daily.  . phentermine (ADIPEX-P) 37.5 MG tablet Take 1/2 to 1 tablet every morning for dieting & weightloss  . rosuvastatin (CRESTOR) 40 MG tablet Take 1 tablet (40 mg total) by mouth daily.  Marland Kitchen triamcinolone ointment (KENALOG) 0.1 % Apply 1 application topically 2 (two) times daily.   No current facility-administered medications on file prior to visit.      Allergies: No Known Allergies   Medical History:  Past Medical History:  Diagnosis Date  . Hyperlipidemia   . Hypertension   . Prediabetes   . Vitamin D deficiency    Family history- Reviewed and unchanged Social history- Reviewed and unchanged   Review of Systems:  Review of Systems  Constitutional: Negative for malaise/fatigue and weight loss.  HENT: Negative for hearing loss and tinnitus.   Eyes: Negative for blurred vision and double vision.  Respiratory: Negative for cough, shortness of breath and wheezing.   Cardiovascular: Negative for chest pain, palpitations, orthopnea, claudication and leg swelling.  Gastrointestinal: Negative for abdominal pain, blood in stool, constipation, diarrhea, heartburn, melena, nausea and vomiting.  Genitourinary: Negative.   Musculoskeletal: Negative for joint pain and myalgias.  Skin: Negative for rash.  Neurological: Negative for dizziness, tingling, sensory change, weakness and headaches.  Endo/Heme/Allergies: Negative for polydipsia.  Psychiatric/Behavioral: Negative.   All other systems reviewed and are negative.     Physical Exam: There were no vitals taken for this visit. Wt Readings from Last 3  Encounters:  10/10/18 265 lb (120.2 kg)  07/18/18 262 lb (118.8 kg)  04/11/18 264 lb (119.7 kg)   General Appearance: Well nourished, in no apparent distress. Eyes: PERRLA, EOMs, conjunctiva no swelling or erythema Sinuses: No Frontal/maxillary tenderness ENT/Mouth: Ext aud canals clear, TMs without erythema, bulging. No erythema, swelling, or exudate on post pharynx.  Tonsils not swollen or erythematous. Hearing normal.  Neck: Supple, thyroid normal.  Respiratory: Respiratory effort normal, BS equal bilaterally without rales, rhonchi, wheezing or stridor.  Cardio: RRR with no MRGs. Brisk peripheral pulses without edema.  Abdomen: Soft, + BS.  Non tender, no guarding, rebound, hernias, masses. Lymphatics: Non tender without lymphadenopathy.  Musculoskeletal: Full ROM, 5/5 strength, Normal gait Skin: Warm, dry without rashes, lesions, ecchymosis.  Neuro: Cranial nerves intact. No cerebellar symptoms.  Psych: Awake and oriented X 3, normal affect, Insight and Judgment appropriate.    Dan Maker, NP 1:22 PM [facility]

## 2019-01-10 ENCOUNTER — Ambulatory Visit: Payer: Self-pay | Admitting: Adult Health

## 2019-01-16 ENCOUNTER — Encounter: Payer: Self-pay | Admitting: Physician Assistant

## 2019-01-18 ENCOUNTER — Other Ambulatory Visit: Payer: Self-pay | Admitting: Adult Health

## 2019-01-18 DIAGNOSIS — E782 Mixed hyperlipidemia: Secondary | ICD-10-CM

## 2019-04-14 NOTE — Progress Notes (Deleted)
Complete Physical  Assessment and Plan:   Encounter for routine adult health examination without abnormal findings  Essential hypertension Continue medication Monitor blood pressure at home; call if consistently over 130/80 Continue DASH diet.   Reminder to go to the ER if any CP, SOB, nausea, dizziness, severe HA, changes vision/speech, left arm numbness and tingling and jaw pain.  OSA on CPAP Continue CPAP; has follow up with Dr. Vickey Huger for adjustment in September  Vitamin D deficiency Continue supplementation Check vitamin D level  Prediabetes Discussed disease and risks; should A1C be above 6.5% this check will initiate metformin Discussed diet/exercise, weight management  A1C  Morbid obesity BMI (35.49)  Long discussion about weight loss, diet, and exercise Recommended diet heavy in fruits and veggies and low in animal meats, cheeses, and dairy products, appropriate calorie intake Discussed appropriate weight for height, goal for next visit 250 lb Follow up at next visit  Hyperlipidemia Continue medications: atorvastatin, zetia Continue low cholesterol diet and exercise.  Check lipid panel.   No orders of the defined types were placed in this encounter.   Discussed med's effects and SE's. Screening labs and tests as requested with regular follow-up as recommended. Over 40 minutes of exam, counseling, chart review and critical decision making was performed  Future Appointments  Date Time Provider Department Center  04/17/2019 10:00 AM Quentin Mulling, PA-C [facility] None  07/24/2019 10:30 AM Dohmeier, Porfirio Mylar, MD [facility] None  04/23/2020 10:00 AM Quentin Mulling, PA-C [facility] None     HPI Patient presents for a complete physical. Works in [employer]. He has Essential hypertension; Hyperlipidemia; Vitamin D deficiency; Prediabetes; Medication management; Morbid obesity BMI (HCC) ; and OSA on CPAP on their problem list. OSA followed by Dr. Vickey Huger with  follow up scheduled in September. 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   BMI is There is no height or weight on file to calculate BMI., he has been working on diet and exercise. Wt Readings from Last 3 Encounters:  10/10/18 265 lb (120.2 kg)  07/18/18 262 lb (118.8 kg)  04/11/18 264 lb (119.7 kg)   His blood pressure has been controlled at home, today their BP is   He does workout (physically intense job). tHe denies chest pain, shortness of breath, dizziness.   He is on cholesterol medication (atorvastatin 80 mg, zetia 10 mg) and denies myalgias. His cholesterol is not at goal. The cholesterol last visit was:   Lab Results  Component Value Date   CHOL 182 10/10/2018   HDL 31 (L) 10/10/2018   LDLCALC 119 (H) 10/10/2018   TRIG 204 (H) 10/10/2018   CHOLHDL 5.9 (H) 10/10/2018   He has been working on diet and exercise for prediabetes, cut out all white bread, sugar, fried foods, he is on bASA, he is not on ACE/ARB and denies foot ulcerations, increased appetite, nausea, paresthesia of the feet, polydipsia, polyuria, visual disturbances, vomiting and weight loss. Last A1C in the office was:  Lab Results  Component Value Date   HGBA1C 6.5 (H) 10/10/2018   Last GFR: Lab Results  Component Value Date   GFRNONAA 75 10/10/2018    Patient is on Vitamin D supplement.   Lab Results  Component Value Date   VD25OH 54 04/11/2018     Last PSA was: Lab Results  Component Value Date   PSA 2.1 04/11/2018    Current Medications:  Current Outpatient Medications on File Prior to Visit  Medication Sig Dispense Refill  . BABY ASPIRIN  PO Take 81 mg by mouth daily.    . bisoprolol-hydrochlorothiazide (ZIAC) 5-6.25 MG tablet TAKE 1 TABLET BY MOUTH ONCE DAILY FOR BLOOD PRESSURE 90 tablet 0  . Cholecalciferol (VITAMIN D PO) Take 5,000 Int'l Units by mouth daily.    . Cyanocobalamin (VITAMIN B-12 PO) Take 1 tablet by mouth daily.    Marland Kitchen ezetimibe (ZETIA) 10 MG tablet Take 1 tablet by  mouth once daily 90 tablet 0  . Omega-3 Fatty Acids (FISH OIL PO) Take 1,200 mg by mouth daily.    . phentermine (ADIPEX-P) 37.5 MG tablet Take 1/2 to 1 tablet every morning for dieting & weightloss 30 tablet 2  . rosuvastatin (CRESTOR) 40 MG tablet Take 1 tablet (40 mg total) by mouth daily. 90 tablet 1  . triamcinolone ointment (KENALOG) 0.1 % Apply 1 application topically 2 (two) times daily. 80 g 1   No current facility-administered medications on file prior to visit.    Allergies:  No Known Allergies Health Maintenance:  Immunization History  Administered Date(s) Administered  . Influenza Inj Mdck Quad With Preservative 10/10/2018  . Influenza Split 10/08/2014  . Influenza-Unspecified 08/07/2015  . PPD Test 10/08/2014, 10/21/2015  . Tdap 08/14/2009   Tetanus: 2010 Pneumovax: N/A Prevnar 13: N/A Flu vaccine: 2016, n/a today Shingrix: declines  DEXA: N/A Colonoscopy: 11/2011 10 year EGD: N/A  [place]am[place], last 2-3 years ago, but has scheduled for this year Dentist: Last several years ago, has new insurance, will schedule appointment  Patient Care Team: Lucky Cowboy, MD as PCP - General (Internal Medicine) Sharrell Ku, MD as Consulting Physician (Gastroenterology)  Medical History:  has Essential hypertension; Hyperlipidemia; Vitamin D deficiency; Prediabetes; Medication management; Morbid obesity BMI (HCC) ; and OSA on CPAP on their problem list. Surgical History:  He  has a past surgical history that includes Hemorrhoid banding (2013) and Nasal septum surgery (2004). Family History:  His family history includes Emphysema in his father; Heart attack in his mother; Heart disease in his father; Hyperlipidemia in his mother. Social History:   reports that he quit smoking about 23 years ago. He has never used smokeless tobacco. He reports that he does not drink alcohol or use drugs. Review of Systems:  Review of Systems  Constitutional: Negative for  malaise/fatigue and weight loss.  HENT: Negative for hearing loss and tinnitus.   Eyes: Negative for blurred vision and double vision.  Respiratory: Negative for cough, shortness of breath and wheezing.   Cardiovascular: Negative for chest pain, palpitations, orthopnea, claudication and leg swelling.  Gastrointestinal: Negative for abdominal pain, blood in stool, constipation, diarrhea, heartburn, melena, nausea and vomiting.  Genitourinary: Negative.   Musculoskeletal: Negative for joint pain and myalgias.  Skin: Negative for rash.  Neurological: Negative for dizziness, tingling, sensory change, weakness and headaches.  Endo/Heme/Allergies: Negative for polydipsia.  Psychiatric/Behavioral: Negative.   All other systems reviewed and are negative.   Physical Exam: Estimated body mass index is 36.96 kg/m as calculated from the following:   Height as of 10/10/18:  (1.803 m).   Weight as of 10/10/18: 265 lb (120.2 kg). There were no vitals taken for this visit. General Appearance: Well nourished, in no apparent distress.  Eyes: PERRLA, EOMs, conjunctiva no swelling or erythema, normal fundi and vessels.  Sinuses: No Frontal/maxillary tenderness  ENT/Mouth: Ext aud canals clear, normal light reflex with TMs without erythema, bulging. Good dentition. No erythema, swelling, or exudate on post pharynx. Tonsils not swollen or erythematous. Hearing normal.  Neck:  Supple, thyroid normal. No bruits  Respiratory: Respiratory effort normal, BS equal bilaterally without rales, rhonchi, wheezing or stridor.  Cardio: RRR without murmurs, rubs or gallops. Brisk peripheral pulses without edema.  Chest: symmetric, with normal excursions and percussion.  Abdomen: Soft, nontender, no guarding, rebound, hernias, masses, or organomegaly.  Lymphatics: Non tender without lymphadenopathy.  Genitourinary:  Musculoskeletal: Full ROM all peripheral extremities,5/5 strength, and normal gait.  Skin: Warm, dry  without rashes, lesions, ecchymosis. Neuro: Cranial nerves intact, reflexes equal bilaterally. Normal muscle tone, no cerebellar symptoms. Sensation intact.  Psych: Awake and oriented X 3, normal affect, Insight and Judgment appropriate.   EKG: WNL no changes.  Quentin MullingAmanda Collier 8:32 AM [facility]

## 2019-04-17 ENCOUNTER — Encounter: Payer: Self-pay | Admitting: Physician Assistant

## 2019-07-24 ENCOUNTER — Ambulatory Visit: Payer: BLUE CROSS/BLUE SHIELD | Admitting: Neurology

## 2019-12-24 ENCOUNTER — Other Ambulatory Visit: Payer: Self-pay | Admitting: Adult Health

## 2020-01-01 ENCOUNTER — Other Ambulatory Visit: Payer: Self-pay | Admitting: Internal Medicine

## 2020-01-04 ENCOUNTER — Other Ambulatory Visit: Payer: Self-pay

## 2020-01-04 ENCOUNTER — Ambulatory Visit: Payer: 59 | Admitting: Internal Medicine

## 2020-01-04 VITALS — BP 132/88 | HR 64 | Temp 97.5°F | Resp 16 | Ht 71.0 in | Wt 233.2 lb

## 2020-01-04 DIAGNOSIS — Z79899 Other long term (current) drug therapy: Secondary | ICD-10-CM | POA: Diagnosis not present

## 2020-01-04 DIAGNOSIS — E785 Hyperlipidemia, unspecified: Secondary | ICD-10-CM | POA: Diagnosis not present

## 2020-01-04 DIAGNOSIS — I10 Essential (primary) hypertension: Secondary | ICD-10-CM | POA: Diagnosis not present

## 2020-01-04 DIAGNOSIS — E1169 Type 2 diabetes mellitus with other specified complication: Secondary | ICD-10-CM | POA: Diagnosis not present

## 2020-01-04 DIAGNOSIS — E119 Type 2 diabetes mellitus without complications: Secondary | ICD-10-CM | POA: Diagnosis not present

## 2020-01-04 NOTE — Patient Instructions (Signed)

## 2020-01-04 NOTE — Progress Notes (Signed)
History of Present Illness:       This very nice 59 y.o. married Arabic man  presents for belated  follow up with HTN, HLD, Pre-Diabetes and Vitamin D Deficiency.       Patient is treated for HTN (2010)  & BP has been controlled at home. Today's BP is near goal with high normal diastolic BP -132/88. Patient has had no complaints of any cardiac type chest pain, palpitations, dyspnea / orthopnea / PND, dizziness, claudication, or dependent edema.      Hyperlipidemia is not controlled with diet & meds. Patient denies myalgias or other med SE's. Last Lipids in 2019 were not at goal:  Lab Results  Component Value Date   CHOL 182 10/10/2018   HDL 31 (L) 10/10/2018   LDLCALC 119 (H) 10/10/2018   TRIG 204 (H) 10/10/2018   CHOLHDL 5.9 (H) 10/10/2018    Also, the patient has history of PreDiabetes (A1c 6.1% / elev insulin 39 / 2014) and has had no symptoms of reactive hypoglycemia, paresthesias, but does report diabetic polys &  visual blurring.  Last A1c in 2019 was not at goal:   Lab Results  Component Value Date   HGBA1C 6.5 (H) 10/10/2018       Further, the patient also has history of Vitamin D Deficiency and supplements vitamin D without any suspected side-effects. Last vitamin D was slight low:  Lab Results  Component Value Date   VD25OH 108 04/11/2018    Current Outpatient Medications on File Prior to Visit  Medication Sig  . BABY ASPIRIN PO Take 81 mg by mouth daily.  . Cholecalciferol (VITAMIN D PO) Take 5,000 Int'l Units by mouth in the morning and at bedtime.   . Coenzyme Q10 (COQ10) 200 MG CAPS Take 1 capsule by mouth daily.  . Cyanocobalamin (VITAMIN B-12 PO) Take 1 tablet by mouth daily.  . Multiple Vitamin (MULTIVITAMIN) tablet Take 1 tablet by mouth daily.  . Omega-3 Fatty Acids (FISH OIL PO) Take 1,200 mg by mouth daily.  . Red Yeast Rice 600 MG CAPS Take 1 capsule by mouth daily.  . rosuvastatin (CRESTOR) 40 MG tablet Take 1 tablet Daily for Cholesterol  .  bisoprolol-hydrochlorothiazide (ZIAC) 5-6.25 MG tablet TAKE 1 TABLET BY MOUTH ONCE DAILY FOR BLOOD PRESSURE (Patient not taking: Reported on 01/04/2020)  . ezetimibe (ZETIA) 10 MG tablet Take 1 tablet by mouth once daily (Patient not taking: Reported on 01/04/2020)  . phentermine (ADIPEX-P) 37.5 MG tablet Take 1/2 to 1 tablet every morning for dieting & weightloss (Patient not taking: Reported on 01/04/2020)   No current facility-administered medications on file prior to visit.    No Known Allergies  PMHx:   Past Medical History:  Diagnosis Date  . Hyperlipidemia   . Hypertension   . Prediabetes   . Vitamin D deficiency     Immunization History  Administered Date(s) Administered  . Influenza Inj Mdck Quad With Preservative 10/10/2018  . Influenza Split 10/08/2014  . Influenza-Unspecified 08/07/2015  . PPD Test 10/08/2014, 10/21/2015  . Tdap 08/14/2009    Past Surgical History:  Procedure Laterality Date  . HEMORRHOID BANDING  2013  . NASAL SEPTUM SURGERY  2004    FHx:    Reviewed / unchanged  SHx:    Reviewed / unchanged   Systems Review:  Constitutional: Denies fever, chills, wt changes, headaches, insomnia, fatigue, night sweats, change in appetite. Eyes: Denies redness, blurred vision, diplopia, discharge, itchy, watery eyes.  ENT: Denies discharge, congestion, post nasal drip, epistaxis, sore throat, earache, hearing loss, dental pain, tinnitus, vertigo, sinus pain, snoring.  CV: Denies chest pain, palpitations, irregular heartbeat, syncope, dyspnea, diaphoresis, orthopnea, PND, claudication or edema. Respiratory: denies cough, dyspnea, DOE, pleurisy, hoarseness, laryngitis, wheezing.  Gastrointestinal: Denies dysphagia, odynophagia, heartburn, reflux, water brash, abdominal pain or cramps, nausea, vomiting, bloating, diarrhea, constipation, hematemesis, melena, hematochezia  or hemorrhoids. Genitourinary: Denies dysuria, frequency, urgency, nocturia, hesitancy,  discharge, hematuria or flank pain. Musculoskeletal: Denies arthralgias, myalgias, stiffness, jt. swelling, pain, limping or strain/sprain.  Skin: Denies pruritus, rash, hives, warts, acne, eczema or change in skin lesion(s). Neuro: No weakness, tremor, incoordination, spasms, paresthesia or pain. Psychiatric: Denies confusion, memory loss or sensory loss. Endo: Denies change in weight, skin or hair change.  Heme/Lymph: No excessive bleeding, bruising or enlarged lymph nodes.  Physical Exam  BP 132/88   Pulse 64   Temp (!) 97.5 F (36.4 C)   Resp 16   Ht 5\' 11"  (1.803 m)   Wt 233 lb 3.2 oz (105.8 kg)   BMI 32.52 kg/m   Appears  well nourished, well groomed  and in no distress.  Eyes: PERRLA, EOMs, conjunctiva no swelling or erythema. Sinuses: No frontal/maxillary tenderness ENT/Mouth: EAC's clear, TM's nl w/o erythema, bulging. Nares clear w/o erythema, swelling, exudates. Oropharynx clear without erythema or exudates. Oral hygiene is good. Tongue normal, non obstructing. Hearing intact.  Neck: Supple. Thyroid not palpable. Car 2+/2+ without bruits, nodes or JVD. Chest: Respirations nl with BS clear & equal w/o rales, rhonchi, wheezing or stridor.  Cor: Heart sounds normal w/ regular rate and rhythm without sig. murmurs, gallops, clicks or rubs. Peripheral pulses normal and equal  without edema.  Abdomen: Soft & bowel sounds normal. Non-tender w/o guarding, rebound, hernias, masses or organomegaly.  Lymphatics: Unremarkable.  Musculoskeletal: Full ROM all peripheral extremities, joint stability, 5/5 strength and normal gait.  Skin: Warm, dry without exposed rashes, lesions or ecchymosis apparent.  Neuro: Cranial nerves intact, reflexes equal bilaterally. Sensory-motor testing grossly intact. Tendon reflexes grossly intact.  Pysch: Alert & oriented x 3.  Insight and judgement nl & appropriate. No ideations.  Assessment and Plan:   1. Hyperlipidemia associated with type 2 diabetes  mellitus ([place])  - Continue diet/meds, exercise,& lifestyle modifications.  - Continue monitor periodic cholesterol/liver & renal functions    2. Essential hypertension  - Continue medication, monitor blood pressure at home.  - Continue DASH diet.  Reminder to go to the ER if any CP,  SOB, nausea, dizziness, severe HA, changes vision/speech.  - CBC with Differential/Platelet - COMPLETE METABOLIC PANEL WITH GFR - TSH  3. Diabetes mellitus without complication ([place])  - Continue diet, exercise  - Lifestyle modifications.  - Monitor appropriate labs.  - Hemoglobin A1c  4. Medication management  - CBC with Differential/Platelet - COMPLETE METABOLIC PANEL WITH GFR - Hemoglobin A1c - TSH       Discussed  regular exercise, BP monitoring, weight control to achieve/maintain BMI less than 25 and discussed med and SE's. Recommended labs to assess and monitor clinical status with further disposition pending results of labs.  I discussed the assessment and treatment plan with the patient. The patient was provided an opportunity to ask questions and all were answered. The patient agreed with the plan and demonstrated an understanding of the instructions.  I provided over 30 minutes of exam, counseling, chart review and  complex critical decision making.         The  patient was advised to call back or seek an in-person evaluation if the symptoms worsen or if the condition fails to improve as anticipated.   Kirtland Bouchard, MD

## 2020-01-05 ENCOUNTER — Other Ambulatory Visit: Payer: Self-pay | Admitting: Internal Medicine

## 2020-01-05 LAB — HEMOGLOBIN A1C: Hgb A1c MFr Bld: >14 % of total Hgb — ABNORMAL HIGH (ref <5.7)

## 2020-01-05 LAB — COMPLETE METABOLIC PANEL WITH GFR
AG Ratio: 1.8 (calc) (ref 1.0–2.5)
ALT: 22 U/L (ref 9–46)
AST: 18 U/L (ref 10–35)
Albumin: 4.4 g/dL (ref 3.6–5.1)
Alkaline phosphatase (APISO): 111 U/L (ref 35–144)
BUN: 15 mg/dL (ref 7–25)
CO2: 26 mmol/L (ref 20–32)
Calcium: 10.2 mg/dL (ref 8.6–10.3)
Chloride: 98 mmol/L (ref 98–110)
Creat: 0.94 mg/dL (ref 0.70–1.33)
GFR, Est African American: 103 mL/min/{1.73_m2} (ref >=60)
GFR, Est Non African American: 89 mL/min/{1.73_m2} (ref >=60)
Globulin: 2.5 g/dL (calc) (ref 1.9–3.7)
Glucose, Bld: 389 mg/dL — ABNORMAL HIGH (ref 65–99)
Potassium: 5.1 mmol/L (ref 3.5–5.3)
Sodium: 135 mmol/L (ref 135–146)
Total Bilirubin: 1.5 mg/dL — ABNORMAL HIGH (ref 0.2–1.2)
Total Protein: 6.9 g/dL (ref 6.1–8.1)

## 2020-01-05 LAB — CBC WITH DIFFERENTIAL/PLATELET
Absolute Monocytes: 447 cells/uL (ref 200–950)
Basophils Absolute: 38 cells/uL (ref 0–200)
Basophils Relative: 0.8 %
Eosinophils Absolute: 52 cells/uL (ref 15–500)
Eosinophils Relative: 1.1 %
HCT: 45.2 % (ref 38.5–50.0)
Hemoglobin: 15.1 g/dL (ref 13.2–17.1)
Lymphs Abs: 1518 cells/uL (ref 850–3900)
MCH: 28.2 pg (ref 27.0–33.0)
MCHC: 33.4 g/dL (ref 32.0–36.0)
MCV: 84.3 fL (ref 80.0–100.0)
MPV: 12.5 fL (ref 7.5–12.5)
Monocytes Relative: 9.5 %
Neutro Abs: 2646 cells/uL (ref 1500–7800)
Neutrophils Relative %: 56.3 %
Platelets: 223 10*3/uL (ref 140–400)
RBC: 5.36 10*6/uL (ref 4.20–5.80)
RDW: 13 % (ref 11.0–15.0)
Total Lymphocyte: 32.3 %
WBC: 4.7 10*3/uL (ref 3.8–10.8)

## 2020-01-05 LAB — TSH: TSH: 2.12 mIU/L (ref 0.40–4.50)

## 2020-01-05 MED ORDER — METFORMIN HCL ER 500 MG PO TB24: ORAL_TABLET | ORAL | 1 refills | Status: DC

## 2020-01-06 ENCOUNTER — Encounter: Payer: Self-pay | Admitting: Internal Medicine

## 2020-01-23 ENCOUNTER — Ambulatory Visit: Payer: 59 | Admitting: Internal Medicine

## 2020-01-23 ENCOUNTER — Encounter: Payer: Self-pay | Admitting: Internal Medicine

## 2020-01-23 ENCOUNTER — Other Ambulatory Visit: Payer: Self-pay

## 2020-01-23 VITALS — BP 110/80 | HR 72 | Temp 97.5°F | Resp 16 | Ht 71.0 in | Wt 230.2 lb

## 2020-01-23 DIAGNOSIS — E1169 Type 2 diabetes mellitus with other specified complication: Secondary | ICD-10-CM

## 2020-01-23 DIAGNOSIS — I10 Essential (primary) hypertension: Secondary | ICD-10-CM | POA: Diagnosis not present

## 2020-01-23 DIAGNOSIS — E559 Vitamin D deficiency, unspecified: Secondary | ICD-10-CM

## 2020-01-23 DIAGNOSIS — E785 Hyperlipidemia, unspecified: Secondary | ICD-10-CM

## 2020-01-23 DIAGNOSIS — E1129 Type 2 diabetes mellitus with other diabetic kidney complication: Secondary | ICD-10-CM | POA: Diagnosis not present

## 2020-01-23 DIAGNOSIS — Z79899 Other long term (current) drug therapy: Secondary | ICD-10-CM

## 2020-01-23 DIAGNOSIS — E1165 Type 2 diabetes mellitus with hyperglycemia: Secondary | ICD-10-CM

## 2020-01-23 MED ORDER — GLIPIZIDE 5 MG PO TABS: ORAL_TABLET | ORAL | 1 refills | Status: DC

## 2020-01-23 NOTE — Progress Notes (Addendum)
Subjective:    Patient ID: Larry Hudson, male    DOB: 1961/04/18, 59 y.o.   MRN: [id number]  HPI     This very nice 59 yo Arabic man with hx/o HTN, HLD, Vit D Deficiency and new onset DM  returns for 2 week f/u presenting with diabetic polys and blurred vision with A1c >14+  (Ave glu  389 mg%).  Patient also has CKD2 (GFR 89).  He has prior hx/o PreDM predating from 2014 and last A1c = 6.5% in 2019.  Patient was started on Metformin and advised monitoring CBG's 2 x /day. Patient & wife report CBG's still running High in the 300-00+ range. Patient reports he's eating better, drinking more water, lost 3 # and not having blurred vision & "polys" as before.  Blood sugar today In office is 376 mg%. 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  Medication Sig  . BABY ASPIRIN PO Take daily.  . bisoprolol-hydrochlorothiazide (ZIAC) 5-6.25 MG tablet TAKE 1 TABLET DAILY  (Patient not taking: Reported on 01/04/2020)  . VITAMIN D  Take 5,000 Int'l Units  in the morning and at bedtime.   . Coenzyme Q10  200 MG  Take 1 capsule daily.  Marland Kitchen VITAMIN B-12 tab  Take 1 tablet daily.  Marland Kitchen ezetimibe10 MG tablet Take 1 tablet daily (Patient not taking: Reported on 01/04/2020)  . metFORMIN -XR 500 MG  Take 1 to 2 tablets 2 x /day with meals for Diabetes  . Multiple Vitamin  Take 1 tablet by mouth daily.  . Omega-3 FISH OIL  Take 1,200 mg by mouth daily.  . phentermine  37.5 MG Take 1/2- 1 tablet daily (Patient not taking: Reported on 01/04/2020)  . Red Yeast Rice 600 MG CAPS Take 1 capsule daily.  . rosuvastatin  40 MG tablet Take 1 tablet Daily for Cholesterol   No facility-administered medications prior to visit.   Past Medical History:  Diagnosis Date  . Hyperlipidemia   . Hypertension   . Prediabetes   . Vitamin D deficiency    Past Surgical History:  Procedure Laterality Date  . HEMORRHOID BANDING   2013  . NASAL SEPTUM SURGERY  2004   Review of Systems    10 point systems review negative except as above.    Objective:   Physical Exam  BP 110/80   Pulse 72   Temp (!) 97.5 F (36.4 C)   Resp 16   Ht 5\' 11"  (1.803 m)   Wt 230 lb 3.2 oz (104.4 kg)   BMI 32.11 kg/m   HEENT - WNL. Neck - supple.  Chest - Clear equal BS. Cor - Nl HS. RRR w/o sig MGR. PP 1(+). No edema. MS- FROM w/o deformities.  Gait Nl. Neuro -  Nl w/o focal abnormalities.     Assessment & Plan:   1. Poorly controlled type 2 diabetes mellitus with renal complication (HCC)  - Adding Glipizide 5 mg #90 x 1 rf   - ROV - 2 weeks with list of sugars.   - Fructosamine - COMPLETE METABOLIC PANEL WITH GFR  2. Essential hypertension  - CBC with Differential/Platelet - COMPLETE METABOLIC PANEL WITH GFR  3. Hyperlipidemia associated with type 2 diabetes mellitus (HCC)  4. Vitamin D deficiency  5. Medication management  - Fructosamine - CBC with Differential/Platelet - COMPLETE METABOLIC PANEL WITH GFR

## 2020-01-23 NOTE — Progress Notes (Signed)
Patient ID: Larry Hudson, male   DOB: December 01, 1960, 59 y.o.   MRN: [id number]

## 2020-01-24 LAB — COMPLETE METABOLIC PANEL WITH GFR
AG Ratio: 1.9 (calc) (ref 1.0–2.5)
ALT: 16 U/L (ref 9–46)
AST: 17 U/L (ref 10–35)
Albumin: 4.3 g/dL (ref 3.6–5.1)
Alkaline phosphatase (APISO): 87 U/L (ref 35–144)
BUN: 15 mg/dL (ref 7–25)
CO2: 27 mmol/L (ref 20–32)
Calcium: 9.9 mg/dL (ref 8.6–10.3)
Chloride: 97 mmol/L — ABNORMAL LOW (ref 98–110)
Creat: 0.9 mg/dL (ref 0.70–1.33)
GFR, Est African American: 109 mL/min/{1.73_m2} (ref >=60)
GFR, Est Non African American: 94 mL/min/{1.73_m2} (ref >=60)
Globulin: 2.3 g/dL (calc) (ref 1.9–3.7)
Glucose, Bld: 401 mg/dL — ABNORMAL HIGH (ref 65–99)
Potassium: 4.7 mmol/L (ref 3.5–5.3)
Sodium: 132 mmol/L — ABNORMAL LOW (ref 135–146)
Total Bilirubin: 1.1 mg/dL (ref 0.2–1.2)
Total Protein: 6.6 g/dL (ref 6.1–8.1)

## 2020-01-24 LAB — CBC WITH DIFFERENTIAL/PLATELET
Absolute Monocytes: 518 cells/uL (ref 200–950)
Basophils Absolute: 38 cells/uL (ref 0–200)
Basophils Relative: 0.7 %
Eosinophils Absolute: 70 cells/uL (ref 15–500)
Eosinophils Relative: 1.3 %
HCT: 41.3 % (ref 38.5–50.0)
Hemoglobin: 13.7 g/dL (ref 13.2–17.1)
Lymphs Abs: 1739 cells/uL (ref 850–3900)
MCH: 28.5 pg (ref 27.0–33.0)
MCHC: 33.2 g/dL (ref 32.0–36.0)
MCV: 85.9 fL (ref 80.0–100.0)
MPV: 11.6 fL (ref 7.5–12.5)
Monocytes Relative: 9.6 %
Neutro Abs: 3035 cells/uL (ref 1500–7800)
Neutrophils Relative %: 56.2 %
Platelets: 298 10*3/uL (ref 140–400)
RBC: 4.81 10*6/uL (ref 4.20–5.80)
RDW: 13 % (ref 11.0–15.0)
Total Lymphocyte: 32.2 %
WBC: 5.4 10*3/uL (ref 3.8–10.8)

## 2020-02-02 ENCOUNTER — Telehealth: Payer: Self-pay | Admitting: *Deleted

## 2020-02-02 NOTE — Telephone Encounter (Signed)
Patient called and reported he is having headaches, which he thinks are related to his Zocor. Per Dr Oneta Rack, he should continue the Zocor and it is OK to take Tylenol for his headaches. He also reported his glucose is around 100 now, but vision is still blurred . Per Dr Oneta Rack, he was informed his body is still adjusting to his glucose rapid reduction and it will take longer for his vision to clear up.

## 2020-02-12 ENCOUNTER — Encounter: Payer: Self-pay | Admitting: Internal Medicine

## 2020-02-12 DIAGNOSIS — R0989 Other specified symptoms and signs involving the circulatory and respiratory systems: Secondary | ICD-10-CM | POA: Insufficient documentation

## 2020-02-12 NOTE — Progress Notes (Signed)
    Subjective:    Patient ID: Larry Hudson, male    DOB: [birth year], 59 y.o.   MRN: [id number]  HPI    This  nice 59 yo married Arabic man with hx/o HTN, HLD, Vit D Deficiency and  prior hx/o PreDM predating from 2014 and last A1c = 6.5% in 2019.      In Feb 2021, he presented with new onset  A1c>14+%)  and CKD2 (GFR 75 - 89 -109). Patient was treated initially with Metformin with CBG's remainining elevated in the 300-400's range.  2 weeks ago,  he had Glipizide added  and returns for returns for 2  f/u.  His Diabetic poly's and blurred vision have resolved.  His bid CBG's are ranging betw 100- 130's range   Medication Sig  . BABY ASPIRIN 81 mg Take daily.  Marland Kitchen VITAMIN D Take 5,000 Int'l Units in the morning and at bedtime.   . Coenzyme Q10  200 MG Take 1 capsule  daily.  Marland Kitchen VITAMIN B-12 tab Take 1 tablet daily.  Marland Kitchen glipiZIDE  5 MG  Take 1 tablet 3 x /day with meals for Diabetes  . metFORMIN -XR 500 MG Take 1 to 2 tablets 2 x /day with meals for Diabetes  . MultiVitamin  Take 1 tablet  daily.  . Omega-3 FISH OIL  Take 1,200 mg daily.  . Red Yeast Rice 600 MG Take 1 capsule  daily.  . rosuvastatin  40 MG  Take 1 tablet Daily for Cholesterol  . zinc gluconate 50 MG  Take  daily.   No Known Allergies   Past Medical History:  Diagnosis Date  . Diabetes mellitus without complication (HCC) 12/2008   A1c >14+%  . Hyperlipidemia   . Hypertension   . Prediabetes   . Vitamin D deficiency     Review of Systems   10 point systems review negative except as above.    Objective:   Physical Exam  BP 130/74   Pulse 64   Temp (!) 97.5 F (36.4 C)   Resp 16   Ht 5\' 11"  (1.803 m)   Wt 235 lb 9.6 oz (106.9 kg)   BMI 32.86 kg/m   HEENT - WNL. Neck - supple.  Chest - Clear equal BS. Cor - Nl HS. RRR w/o sig MGR. PP 1(+). No edema. MS- FROM w/o deformities.  Gait Nl. Neuro -  Nl w/o focal abnormalities.    Assessment & Plan:   1. Labile hypertension  2. Hyperlipidemia associated with  type 2 diabetes mellitus (HCC)  3. Type 2 diabetes mellitus with stage 2 chronic kidney disease,  without long-term current use of insulin (HCC)  4. Vitamin D deficiency  5. Medication management  - Long discussion with patient (& wife) regarding dieting & weight loss. Discussed hypoglycemic sx's and measures to take if needed.   - Next appt 04/23/2020 for CPE w/ A. 04/25/2020, PA-C

## 2020-02-13 ENCOUNTER — Encounter: Payer: Self-pay | Admitting: Internal Medicine

## 2020-02-13 ENCOUNTER — Other Ambulatory Visit: Payer: Self-pay

## 2020-02-13 ENCOUNTER — Ambulatory Visit (INDEPENDENT_AMBULATORY_CARE_PROVIDER_SITE_OTHER): Payer: 59 | Admitting: Internal Medicine

## 2020-02-13 VITALS — BP 130/74 | HR 64 | Temp 97.5°F | Resp 16 | Ht 71.0 in | Wt 235.6 lb

## 2020-02-13 DIAGNOSIS — E559 Vitamin D deficiency, unspecified: Secondary | ICD-10-CM | POA: Diagnosis not present

## 2020-02-13 DIAGNOSIS — Z79899 Other long term (current) drug therapy: Secondary | ICD-10-CM

## 2020-02-13 DIAGNOSIS — R0989 Other specified symptoms and signs involving the circulatory and respiratory systems: Secondary | ICD-10-CM

## 2020-02-13 DIAGNOSIS — E1169 Type 2 diabetes mellitus with other specified complication: Secondary | ICD-10-CM

## 2020-02-13 DIAGNOSIS — E1122 Type 2 diabetes mellitus with diabetic chronic kidney disease: Secondary | ICD-10-CM | POA: Diagnosis not present

## 2020-02-13 DIAGNOSIS — E785 Hyperlipidemia, unspecified: Secondary | ICD-10-CM

## 2020-02-13 DIAGNOSIS — N182 Chronic kidney disease, stage 2 (mild): Secondary | ICD-10-CM

## 2020-02-13 NOTE — Patient Instructions (Signed)
    Recommend the book "The END of DIABETES "   by Dr Monico Hoar  At [email or web address] - get book & Audio CD's     Being diabetic has a  300% increased risk for heart attack, stroke, cancer, and alzheimer- type vascular dementia. It is very important that you work harder with diet by avoiding all foods that are white. Avoid white rice (brown & wild rice is OK), white potatoes (sweetpotatoes in moderation is OK), White bread or wheat bread or anything made out of white flour like bagels, donuts, rolls, buns, biscuits, cakes, pastries, cookies, pizza crust, and pasta (made from white flour & egg whites) - vegetarian pasta or spinach or wheat pasta is OK. Multigrain breads like Arnold's or Pepperidge Farm, or multigrain sandwich thins or flatbreads.  Diet, exercise and weight loss can reverse and cure diabetes in the early stages.  Diet, exercise and weight loss is very important in the control and prevention of complications of diabetes which affects every system in your body, ie. Brain - dementia/stroke, eyes - glaucoma/blindness, heart - heart attack/heart failure, kidneys - dialysis, stomach - gastric paralysis, intestines - malabsorption, nerves - severe painful neuritis, circulation - gangrene & loss of a leg(s), and finally cancer and Alzheimers.    I recommend avoid fried & greasy foods,  sweets/candy, white rice (brown or wild rice or Quinoa is OK), white potatoes (sweet potatoes are OK) - anything made from white flour - bagels, doughnuts, rolls, buns, biscuits,white and wheat breads, pizza crust and traditional pasta made of white flour & egg white(vegetarian pasta or spinach or wheat pasta is OK).  Multi-grain bread is OK - like multi-grain flat bread or sandwich thins. Avoid alcohol in excess. Exercise is also important.    Eat all the vegetables you want - avoid meat, especially red meat and dairy - especially cheese.  Cheese is the most concentrated form of trans-fats which is the worst thing to  clog up our arteries. Veggie cheese is OK which can be found in the fresh produce section at [facility] or [employer] or [employer]  ++++++++++++++++++++++++++

## 2020-03-31 ENCOUNTER — Other Ambulatory Visit: Payer: Self-pay | Admitting: Internal Medicine

## 2020-03-31 DIAGNOSIS — E1129 Type 2 diabetes mellitus with other diabetic kidney complication: Secondary | ICD-10-CM

## 2020-04-22 DIAGNOSIS — E785 Hyperlipidemia, unspecified: Secondary | ICD-10-CM | POA: Insufficient documentation

## 2020-04-22 DIAGNOSIS — E1122 Type 2 diabetes mellitus with diabetic chronic kidney disease: Secondary | ICD-10-CM | POA: Insufficient documentation

## 2020-04-22 DIAGNOSIS — N182 Chronic kidney disease, stage 2 (mild): Secondary | ICD-10-CM | POA: Insufficient documentation

## 2020-04-22 NOTE — Progress Notes (Signed)
Complete Physical  Assessment and Plan:  Encounter for routine adult health examination without abnormal findings 1 year CPE Need TDAP and pneumonia vaccine but he is int he middle of finishing COVID vaccine- suggest waiting 1 month- can get next OV Get eye exam  Essential hypertension - continue medications, DASH diet, exercise and monitor at home. Call if greater than 130/80.  -     CBC with Differential/Platelet -     COMPLETE METABOLIC PANEL WITH GFR -     TSH -     Urinalysis, Routine w reflex microscopic -     Microalbumin / creatinine urine ratio -     EKG 12-Lead -     [place], RETROPERITNL ABD,  LTD  Morbid obesity BMI (HCC)  - follow up 3 months for progress monitoring - increase veggies, decrease carbs - long discussion about weight loss, diet, and exercise  Medication management -     Magnesium  Vitamin D deficiency -     VITAMIN D 25 Hydroxy (Vit-D Deficiency, Fractures)  OSA on CPAP Continue CPAP  Hyperlipidemia associated with type 2 diabetes mellitus (HCC) -     Lipid panel The rouvastatin 40mg - cut in 1/2 and can try morning or night.  If cholesterol is not at goal will add back on zetia.   Type 2 diabetes mellitus with stage 2 chronic kidney disease, without long-term current use of insulin (HCC) -     Hemoglobin A1c Discussed general issues about diabetes pathophysiology and management., Educational material distributed., Suggested low cholesterol diet., Encouraged aerobic exercise., Discussed foot care., Reminded to get yearly retinal exam.  CKD stage 2 due to type 2 diabetes mellitus (HCC) -     COMPLETE METABOLIC PANEL WITH GFR -     Hemoglobin A1c Increase fluids, avoid NSAIDS, monitor sugars, will monitor  Screening, anemia, deficiency, iron -     Iron,Total/Total Iron Binding Cap -     Vitamin B12  Screening PSA (prostate specific antigen) -     PSA  Strain of left hip adductor muscle, initial encounter -     DG HIP UNILAT W OR W/O PELVIS  2-3 VIEWS LEFT; Future -     meloxicam (MOBIC) 15 MG tablet; Take one daily with food for 2 weeks, can take with tylenol, can not take with aleve, iburpofen, then as needed daily for pain - if not better get Xray  Discussed med's effects and SE's. Screening labs and tests as requested with regular follow-up as recommended. Over 40 minutes of exam, counseling, chart review and critical decision making was performed  Future Appointments  Date Time Provider Department Center  07/30/2020  8:45 AM 08/01/2020, PA-C [facility] None  04/23/2021  9:00 AM 04/25/2021, PA-C [facility] None     HPI Patient presents for a complete physical. Works in [employer]. He has Essential hypertension; Hyperlipidemia; Vitamin D deficiency; Abnormal glucose; Medication management; Morbid obesity BMI (HCC) ; OSA on CPAP; Labile hypertension; CKD stage 2 due to type 2 diabetes mellitus (HCC); Type 2 diabetes mellitus with stage 2 chronic kidney disease, without long-term current use of insulin (HCC); and Hyperlipidemia associated with type 2 diabetes mellitus (HCC) on their problem list.   OSA followed by Dr. Metallurgist with follow up scheduled in September.   He also has DOT physical, as he is a [place]. He states he has a physical job. He does not recall an injury at work but states he lifts a lot. For 2 weeks he  has had left inguinal/thigh pain, feels like pulled muscle, no radiation, worse when he first wakes up in the morning. Better with ibuprofen or aleve and after walking. No pain down his leg.     BMI is Body mass index is 35.3 kg/m., he has been working on diet and exercise. Wt Readings from Last 3 Encounters:  04/23/20 246 lb (111.6 kg)  02/13/20 235 lb 9.6 oz (106.9 kg)  01/23/20 230 lb 3.2 oz (104.4 kg)   His blood pressure has been controlled at home, today their BP is BP: 128/82 He does workout (physically intense job). tHe denies chest pain, shortness of breath, dizziness.   He  is on cholesterol medication (switched to crestor 40 mg zetia 10 mg) and denies myalgias. His cholesterol is not at goal. The cholesterol last visit was:   Lab Results  Component Value Date   CHOL 182 10/10/2018   HDL 31 (L) 10/10/2018   LDLCALC 119 (H) 10/10/2018   TRIG 204 (H) 10/10/2018   CHOLHDL 5.9 (H) 10/10/2018   He has been working on diet and exercise for diabetes He states he has been very strict with diet, he is using stevia rather than sugar, he is off cookies/icecream. He will occ eat sugar free ice cream. He is eating dave's killer bread if he does eat. He is increasing his veggies.   With CKD not on ACE- last kidney function at goal With hyperlipidemia not at goal of 70- on crestor 40 mg changed last tiime to reach goal- having HA's- may adjust  he is on bASA  he is not on ACE/ARB  He was started on glipizide 5 mg 2 x a day Metformin 500 mg 2 pills a day and denies foot ulcerations, increased appetite, nausea, paresthesia of the feet, polydipsia, polyuria, visual disturbances, vomiting and weight loss.  Last A1C in the office was:  Lab Results  Component Value Date   HGBA1C >14.0 (H) 01/04/2020   Last GFR: Lab Results  Component Value Date   [facility] 94 01/23/2020    Patient is on Vitamin D supplement.   Lab Results  Component Value Date   VD25OH 54 04/11/2018     Last PSA was: Lab Results  Component Value Date   PSA 2.1 04/11/2018    Current Medications:   Current Outpatient Medications (Endocrine & Metabolic):  .  glipiZIDE (GLUCOTROL) 5 MG tablet, Take 1 tablet  3 x  /day with Meals for Diabetes .  metFORMIN (GLUCOPHAGE-XR) 500 MG 24 hr tablet, Take 1 to 2 tablets 2 x /day with meals for Diabetes  Current Outpatient Medications (Cardiovascular):  .  rosuvastatin (CRESTOR) 40 MG tablet, Take 1 tablet Daily for Cholesterol   Current Outpatient Medications (Analgesics):  Marland Kitchen  BABY ASPIRIN PO, Take 81 mg by mouth daily. .  meloxicam (MOBIC) 15 MG  tablet, Take one daily with food for 2 weeks, can take with tylenol, can not take with aleve, iburpofen, then as needed daily for pain   Current Outpatient Medications (Other):  Marland Kitchen  Cholecalciferol (VITAMIN D PO), Take 5,000 Int'l Units by mouth in the morning and at bedtime.  .  Multiple Vitamin (MULTIVITAMIN) tablet, Take 1 tablet by mouth daily. .  Omega-3 Fatty Acids (FISH OIL PO), Take 1,200 mg by mouth daily. Marland Kitchen  zinc gluconate 50 MG tablet, Take 50 mg by mouth daily.  Allergies:  No Known Allergies   Health Maintenance:  Immunization History  Administered Date(s) Administered  . Influenza Inj Mdck  Quad With Preservative 10/10/2018  . Influenza Split 10/08/2014  . Influenza-Unspecified 08/07/2015  . PFIZER SARS-COV-2 Vaccination 04/07/2020  . PPD Test 10/08/2014, 10/21/2015  . Tdap 08/14/2009   Health Maintenance  Topic Date Due  . PNEUMOCOCCAL POLYSACCHARIDE VACCINE AGE 49-64 HIGH RISK  Never done  . FOOT EXAM  Never done  . OPHTHALMOLOGY EXAM  Never done  . URINE MICROALBUMIN  04/12/2019  . TETANUS/TDAP  08/15/2019  . COVID-19 Vaccine (2 - Pfizer 2-dose series) 04/28/2020  . INFLUENZA VACCINE  06/09/2020  . HEMOGLOBIN A1C  07/03/2020  . COLONOSCOPY  11/25/2021  . Hepatitis C Screening  Completed  . HIV Screening  Completed    Tetanus: 2010 DUE but getting COVID vaccines Pneumovax: DUE- delay due to COVID vaccines Prevnar 13: N/A Flu vaccine: 2020 Shingrix: declines- discussed  DEE 2019  DEXA: N/A Colonoscopy: 11/2011 10 year EGD: N/A  [place] Exam: [place], last 2-3 years ago, but has scheduled for this year Dentist: Last several years ago, has new insurance, will schedule appointment  Patient Care Team: Lucky Cowboy, MD as PCP - General (Internal Medicine) Sharrell Ku, MD as Consulting Physician (Gastroenterology)  Medical History:  has Essential hypertension; Hyperlipidemia; Vitamin D deficiency; Abnormal glucose; Medication management;  Morbid obesity BMI (HCC) ; OSA on CPAP; Labile hypertension; CKD stage 2 due to type 2 diabetes mellitus (HCC); Type 2 diabetes mellitus with stage 2 chronic kidney disease, without long-term current use of insulin (HCC); and Hyperlipidemia associated with type 2 diabetes mellitus (HCC) on their problem list. Surgical History:  He  has a past surgical history that includes Hemorrhoid banding (2013) and Nasal septum surgery (2004). Family History:  His family history includes Emphysema in his father; Heart attack in his mother; Heart disease in his father; Hyperlipidemia in his mother. Social History:   reports that he quit smoking about 24 years ago. He has never used smokeless tobacco. He reports that he does not drink alcohol and does not use drugs. Review of Systems:  Review of Systems  Constitutional: Negative for malaise/fatigue and weight loss.  HENT: Negative for hearing loss and tinnitus.   Eyes: Negative for blurred vision and double vision.  Respiratory: Negative for cough, shortness of breath and wheezing.   Cardiovascular: Negative for chest pain, palpitations, orthopnea, claudication and leg swelling.  Gastrointestinal: Negative for abdominal pain, blood in stool, constipation, diarrhea, heartburn, melena, nausea and vomiting.  Genitourinary: Negative.   Musculoskeletal: Negative for joint pain and myalgias.  Skin: Negative for rash.  Neurological: Negative for dizziness, tingling, sensory change, weakness and headaches.  Endo/Heme/Allergies: Negative for polydipsia.  Psychiatric/Behavioral: Negative.   All other systems reviewed and are negative.   Physical Exam: Estimated body mass index is 35.3 kg/m as calculated from the following:   Height as of this encounter: 5\' 10"  (1.778 m).   Weight as of this encounter: 246 lb (111.6 kg). BP 128/82   Pulse 77   Temp (!) 97.2 F (36.2 C)   Ht 5\' 10"  (1.778 m)   Wt 246 lb (111.6 kg)   SpO2 98%   BMI 35.30 kg/m  General  Appearance: Well nourished, in no apparent distress.  Eyes: PERRLA, EOMs, conjunctiva no swelling or erythema, normal fundi and vessels.  Sinuses: No Frontal/maxillary tenderness  ENT/Mouth: Ext aud canals clear, normal light reflex with TMs without erythema, bulging. Good dentition. No erythema, swelling, or exudate on post pharynx. Tonsils not swollen or erythematous. Hearing normal.  Neck: Supple, thyroid normal. No bruits  Respiratory: Respiratory effort normal, BS equal bilaterally without rales, rhonchi, wheezing or stridor.  Cardio: RRR without murmurs, rubs or gallops. Brisk peripheral pulses without edema.  Chest: symmetric, with normal excursions and percussion.  Abdomen: Soft, nontender, no guarding, rebound, hernias, masses, or organomegaly.  Lymphatics: Non tender without lymphadenopathy.  Genitourinary:  Musculoskeletal: Full ROM all peripheral extremities,5/5 strength, and normal gait.  Skin: Warm, dry without rashes, lesions, ecchymosis. Neuro: Cranial nerves intact, reflexes equal bilaterally. Normal muscle tone, no cerebellar symptoms. Sensation intact.  Psych: Awake and oriented X 3, normal affect, Insight and Judgment appropriate.   EKG: WNL no changes. Aorta Scan WNL  Vicie Mutters 12:36 PM [facility]

## 2020-04-23 ENCOUNTER — Encounter: Payer: Self-pay | Admitting: Physician Assistant

## 2020-04-23 ENCOUNTER — Ambulatory Visit: Payer: 59 | Admitting: Physician Assistant

## 2020-04-23 ENCOUNTER — Other Ambulatory Visit: Payer: Self-pay

## 2020-04-23 VITALS — BP 128/82 | HR 77 | Temp 97.2°F | Ht 70.0 in | Wt 246.0 lb

## 2020-04-23 DIAGNOSIS — Z0001 Encounter for general adult medical examination with abnormal findings: Secondary | ICD-10-CM

## 2020-04-23 DIAGNOSIS — Z131 Encounter for screening for diabetes mellitus: Secondary | ICD-10-CM | POA: Diagnosis not present

## 2020-04-23 DIAGNOSIS — Z0000 Encounter for general adult medical examination without abnormal findings: Secondary | ICD-10-CM

## 2020-04-23 DIAGNOSIS — Z130 Encounter for screening for diseases of the blood and blood-forming organs and certain disorders involving the immune mechanism: Secondary | ICD-10-CM

## 2020-04-23 DIAGNOSIS — G4733 Obstructive sleep apnea (adult) (pediatric): Secondary | ICD-10-CM

## 2020-04-23 DIAGNOSIS — E1122 Type 2 diabetes mellitus with diabetic chronic kidney disease: Secondary | ICD-10-CM

## 2020-04-23 DIAGNOSIS — Z136 Encounter for screening for cardiovascular disorders: Secondary | ICD-10-CM | POA: Diagnosis not present

## 2020-04-23 DIAGNOSIS — Z125 Encounter for screening for malignant neoplasm of prostate: Secondary | ICD-10-CM

## 2020-04-23 DIAGNOSIS — Z13220 Encounter for screening for lipoid disorders: Secondary | ICD-10-CM | POA: Diagnosis not present

## 2020-04-23 DIAGNOSIS — E785 Hyperlipidemia, unspecified: Secondary | ICD-10-CM

## 2020-04-23 DIAGNOSIS — I10 Essential (primary) hypertension: Secondary | ICD-10-CM | POA: Diagnosis not present

## 2020-04-23 DIAGNOSIS — N401 Enlarged prostate with lower urinary tract symptoms: Secondary | ICD-10-CM | POA: Diagnosis not present

## 2020-04-23 DIAGNOSIS — R350 Frequency of micturition: Secondary | ICD-10-CM | POA: Diagnosis not present

## 2020-04-23 DIAGNOSIS — E559 Vitamin D deficiency, unspecified: Secondary | ICD-10-CM

## 2020-04-23 DIAGNOSIS — Z1389 Encounter for screening for other disorder: Secondary | ICD-10-CM | POA: Diagnosis not present

## 2020-04-23 DIAGNOSIS — N182 Chronic kidney disease, stage 2 (mild): Secondary | ICD-10-CM

## 2020-04-23 DIAGNOSIS — Z79899 Other long term (current) drug therapy: Secondary | ICD-10-CM

## 2020-04-23 DIAGNOSIS — S76012A Strain of muscle, fascia and tendon of left hip, initial encounter: Secondary | ICD-10-CM

## 2020-04-23 MED ORDER — MELOXICAM 15 MG PO TABS: ORAL_TABLET | ORAL | 1 refills | Status: DC

## 2020-04-23 NOTE — Patient Instructions (Addendum)
You are due for you TDAP and pneumonia vaccine- will get next OV since you are getting your COVID vaccines- want to wait a month from that.   Suggest getting an eye exam- let us know where you go  The rouvastatin 40mg- cut in 1/2 and can try morning RATHER THAN NIGHT If cholesterol is not at goal will add back on zetia.  WE WILL HAVE A BETTER IDEA AFTER TODAY'S LABS  Your LDL is not in range or at goal, goal is less than 70, you were at 119 last time.   Your LDL is the bad cholesterol that can lead to heart attack and stroke. To lower your number you can decrease your fatty foods, red meat, cheese, milk and increase fiber like whole grains and veggies. You can also add a fiber supplement like Citracel or Benefiber, these do not cause gas and bloating and are safe to use. Since you have risk factors that make us want your number below 70, we need to adjust or add medications to get your number below goal.   Please be careful with glipizide (Glucotrol).   This medication forces your blood sugar down no matter what it is starting at.  ONLY TAKE THIS MEDICATION WITH FOOD  If at any time you start to have low blood sugars in the morning or during the day please stop this medication.  Please never take this medication if you are sick or can not eat.  A low blood sugar is much more dangerous than a high blood sugar. Your brain needs two things, sugar and oxygen.   Your ears and sinuses are connected by the eustachian tube. When your sinuses are inflamed, this can close off the tube and cause fluid to collect in your middle ear. This can then cause dizziness, popping, clicking, ringing, and echoing in your ears. This is often NOT an infection and does NOT require antibiotics, it is caused by inflammation so the treatments help the inflammation. This can take a long time to get better so please be patient.  Here are things you can do to help with this: - Try the Flonase or Nasonex. Remember to spray  each nostril twice towards the outer part of your eye.  Do not sniff but instead pinch your nose and tilt your head back to help the medicine get into your sinuses.  The best time to do this is at bedtime.Stop if you get blurred vision or nose bleeds.  -While drinking fluids, pinch and hold nose close and swallow, to help open eustachian tubes to drain fluid behind ear drums. -Please pick one of the over the counter allergy medications below and take it once daily for allergies.  It will also help with fluid behind ear drums. Claritin or loratadine cheapest but likely the weakest  Zyrtec or certizine at night because it can make you sleepy The strongest is allegra or fexafinadine  Cheapest at [facility], [facility], [employer] -can use decongestant over the counter, please do not use if you have high blood pressure or certain heart conditions.   if worsening HA, changes vision/speech, imbalance, weakness go to the ER  FOR YOUR LEFT HIP Mobic is an antiinflammatory It helps pain, can not take with aleve, or ibuprofen You can take tylenol (500mg) or tylenol arthritis (650mg) with the meloxicam/antiinflammatories. The max you can take of tylenol a day is 3000mg daily, this is a max of 6 pills a day of the regular tyelnol (500mg) or a max of   4 a day of the tylenol arthritis (650mg) as long as no other medications you are taking contain tylenol.   Mobic can cause inflammation in your stomach and can cause ulcers or bleeding, this will look like black tarry stools Make sure you take your mobic with food Try not to take it daily, take AS needed Can take with pepcid  IF NOT BETTER IN 2 WEEKS SUGGEST XRAY OF YOUR LEFT HIP INFORMATION ABOUT YOUR XRAY [facility] Can walk into [address] for an xray. They will have the order and take you back. You do not any paper work, I should get the result back today or tomorrow. This order is good for a year.  Can call [phone] to schedule an  appointment if you wish.    Adductor Muscle Strain With Rehab Ask your health care provider which exercises are safe for you. Do exercises exactly as told by your health care provider and adjust them as directed. It is normal to feel mild stretching, pulling, tightness, or mild discomfort as you do these exercises. Stop right away if you feel sudden pain or your pain gets worse. Do not begin these exercises until told by your health care provider. Strengthening exercises These exercises build strength and endurance in your thighs. Endurance is the ability to use your muscles for a long time, even after your muscles get tired. Hip adductor isometrics  This exercise is sometimes called inner thigh squeeze. 1. Sit on a firm chair that positions your knees at about the same height as your hips. 2. Place a large ball, firm pillow, or rolled-up bath towel between your thighs. 3. Squeeze your thighs together, gradually building tension. 4. Hold for __________ seconds. 5. Release the tension gradually. Allow your inner thigh muscles to relax completely before you start the next repetition. Repeat __________ times. Complete this exercise __________ times a day. Hip adduction  This exercise is sometimes called side lying straight leg raises. 1. Lie on your side so your head, shoulder, knee, and hip are in a straight line with each other. To help maintain your balance, you may put the foot of your top leg in front of the leg that is on the floor. Your left / right leg should be on the bottom. 2. Roll your hips slightly forward so your hips are stacked directly over each other and your left / right knee is facing forward. 3. Tense the muscles of your inner thigh and lift your bottom leg 4-6 inches (10-15 cm). 4. Hold this position for __________ seconds. 5. Slowly lower your leg to the starting position. 6. Allow your muscles to relax completely before you start the next repetition. Repeat __________  times. Complete this exercise __________ times a day. Hip extension  This exercise is sometimes called prone (on your belly) straight leg raises. 1. Lie on your belly on a bed or a firm surface with a pillow under your hips. 2. Tense your buttock muscles and lift your left / right thigh off the bed. Your left / right knee can be bent or straight, but do not let your back arch. 3. Hold this position for __________ seconds. 4. Slowly return to the starting position. 5. Allow your muscles to relax completely before you start the next repetition. Repeat __________ times. Complete this exercise __________ times a day. Balance exercises These exercises improve or maintain your balance. Balance is important in preventing falls. Single-leg balance 1. Stand near a railing or by a   door frame that you can hold onto as needed. 2. Stand on your left / right foot. Keep your big toe down on the floor and try to keep your arch lifted. 3. If this is too easy, you can stand with your eyes closed or stand on a pillow. 4. Hold this position for __________ seconds. Repeat __________ times. Complete this exercise __________ times a day. Side lunges 1. Stand with your feet together. 2. Keeping one foot in place, step to the side with your other foot about __________ inches (__________ cm). Do not step so far that you feel discomfort in your middle thigh. 3. Push off from your stepping foot to return to the starting position. Repeat __________ times. Complete this exercise __________ times a day. This information is not intended to replace advice given to you by your health care provider. Make sure you discuss any questions you have with your health care provider. Document Revised: 02/14/2019 Document Reviewed: 07/26/2018 Elsevier Patient Education  [place].   Hypoglycemia Hypoglycemia occurs when the level of sugar (glucose) in the blood is too low. Hypoglycemia can happen in people who do or do not  have diabetes. It can develop quickly, and it can be a medical emergency. For most people with diabetes, a blood glucose level below 70 mg/dL (3.9 mmol/L) is considered hypoglycemia. Glucose is a type of sugar that provides the body's main source of energy. Certain hormones (insulin and glucagon) control the level of glucose in the blood. Insulin lowers blood glucose, and glucagon raises blood glucose. Hypoglycemia can result from having too much insulin in the bloodstream, or from not eating enough food that contains glucose. You may also have reactive hypoglycemia, which happens within 4 hours after eating a meal. What are the causes? Hypoglycemia occurs most often in people who have diabetes and may be caused by:  Diabetes medicine.  Not eating enough, or not eating often enough.  Increased physical activity.  Drinking alcohol on an empty stomach. If you do not have diabetes, hypoglycemia may be caused by:  A tumor in the pancreas.  Not eating enough, or not eating for long periods at a time (fasting).  A severe infection or illness.  Certain medicines. What increases the risk? Hypoglycemia is more likely to develop in:  People who have diabetes and take medicines to lower blood glucose.  People who abuse alcohol.  People who have a severe illness. What are the signs or symptoms? Mild symptoms Mild hypoglycemia may not cause any symptoms. If you do have symptoms, they may include:  Hunger.  Anxiety.  Sweating and feeling clammy.  Dizziness or feeling light-headed.  Sleepiness.  Nausea.  Increased heart rate.  Headache.  Blurry vision.  Irritability.  Tingling or numbness around the mouth, lips, or tongue.  A change in coordination.  Restless sleep. Moderate symptoms Moderate hypoglycemia can cause:  Mental confusion and poor judgment.  Behavior changes.  Weakness.  Irregular heartbeat. Severe symptoms Severe hypoglycemia is a medical emergency.  It can cause:  Fainting.  Seizures.  Loss of consciousness (coma).  Death. How is this diagnosed? Hypoglycemia is diagnosed with a blood test to measure your blood glucose level. This blood test is done while you are having symptoms. Your health care provider may also do a physical exam and review your medical history. How is this treated? This condition can often be treated by immediately eating or drinking something that contains sugar, such as:  Fruit juice, 4-6 oz (120-150 mL).  Regular soda (not diet soda), 4-6 oz (120-150 mL).  Low-fat milk, 4 oz (120 mL).  Several pieces of hard candy.  Sugar or honey, 1 Tbsp (15 mL). Treating hypoglycemia if you have diabetes If you are alert and able to swallow safely, follow the 15:15 rule:  Take 15 grams of a rapid-acting carbohydrate. Talk with your health care provider about how much you should take.  Rapid-acting options include: ? Glucose pills (take 15 grams). ? 6-8 pieces of hard candy. ? 4-6 oz (120-150 mL) of fruit juice. ? 4-6 oz (120-150 mL) of regular (not diet) soda. ? 1 Tbsp (15 mL) honey or sugar.  Check your blood glucose 15 minutes after you take the carbohydrate.  If the repeat blood glucose level is still at or below 70 mg/dL (3.9 mmol/L), take 15 grams of a carbohydrate again.  If your blood glucose level does not increase above 70 mg/dL (3.9 mmol/L) after 3 tries, seek emergency medical care.  After your blood glucose level returns to normal, eat a meal or a snack within 1 hour.  Treating severe hypoglycemia Severe hypoglycemia is when your blood glucose level is at or below 54 mg/dL (3 mmol/L). Severe hypoglycemia is a medical emergency. Get medical help right away. If you have severe hypoglycemia and you cannot eat or drink, you may need an injection of glucagon. A family member or close friend should learn how to check your blood glucose and how to give you a glucagon injection. Ask your health care  provider if you need to have an emergency glucagon injection kit available. Severe hypoglycemia may need to be treated in a hospital. The treatment may include getting glucose through an IV. You may also need treatment for the cause of your hypoglycemia. Follow these instructions at home:  General instructions  Take over-the-counter and prescription medicines only as told by your health care provider.  Monitor your blood glucose as told by your health care provider.  Limit alcohol intake to no more than 1 drink a day for nonpregnant women and 2 drinks a day for men. One drink equals 12 oz of beer (355 mL), 5 oz of wine (148 mL), or 1 oz of hard liquor (44 mL).  Keep all follow-up visits as told by your health care provider. This is important. If you have diabetes:  Always have a rapid-acting carbohydrate snack with you to treat low blood glucose.  Follow your diabetes management plan as directed. Make sure you: ? Know the symptoms of hypoglycemia. It is important to treat it right away to prevent it from becoming severe. ? Take your medicines as directed. ? Follow your exercise plan. ? Follow your meal plan. Eat on time, and do not skip meals. ? Check your blood glucose as often as directed. Always check before and after exercise. ? Follow your sick day plan whenever you cannot eat or drink normally. Make this plan in advance with your health care provider.  Share your diabetes management plan with people in your workplace, school, and household.  Check your urine for ketones when you are ill and as told by your health care provider.  Carry a medical alert card or wear medical alert jewelry. Contact a health care provider if:  You have problems keeping your blood glucose in your target range.  You have frequent episodes of hypoglycemia. Get help right away if:  You continue to have hypoglycemia symptoms after eating or drinking something containing glucose.  Your blood glucose  is at or below 54 mg/dL (3 mmol/L).  You have a seizure.  You faint. These symptoms may represent a serious problem that is an emergency. Do not wait to see if the symptoms will go away. Get medical help right away. Call your local emergency services (911 in the [place] Summary  Hypoglycemia occurs when the level of sugar (glucose) in the blood is too low.  Hypoglycemia can happen in people who do or do not have diabetes. It can develop quickly, and it can be a medical emergency.  Make sure you know the symptoms of hypoglycemia and how to treat it.  Always have a rapid-acting carbohydrate snack with you to treat low blood sugar. This information is not intended to replace advice given to you by your health care provider. Make sure you discuss any questions you have with your health care provider. Document Revised: 04/18/2018 Document Reviewed: 11/29/2015 Elsevier Patient Education  2020 [employer].

## 2020-04-24 LAB — COMPLETE METABOLIC PANEL WITH GFR
AG Ratio: 1.8 (calc) (ref 1.0–2.5)
ALT: 16 U/L (ref 9–46)
AST: 16 U/L (ref 10–35)
Albumin: 4.5 g/dL (ref 3.6–5.1)
Alkaline phosphatase (APISO): 55 U/L (ref 35–144)
BUN: 17 mg/dL (ref 7–25)
CO2: 26 mmol/L (ref 20–32)
Calcium: 9.9 mg/dL (ref 8.6–10.3)
Chloride: 106 mmol/L (ref 98–110)
Creat: 0.88 mg/dL (ref 0.70–1.33)
GFR, Est African American: 109 mL/min/{1.73_m2} (ref >=60)
GFR, Est Non African American: 94 mL/min/{1.73_m2} (ref >=60)
Globulin: 2.5 g/dL (calc) (ref 1.9–3.7)
Glucose, Bld: 113 mg/dL — ABNORMAL HIGH (ref 65–99)
Potassium: 4.2 mmol/L (ref 3.5–5.3)
Sodium: 142 mmol/L (ref 135–146)
Total Bilirubin: 1 mg/dL (ref 0.2–1.2)
Total Protein: 7 g/dL (ref 6.1–8.1)

## 2020-04-24 LAB — CBC WITH DIFFERENTIAL/PLATELET
Absolute Monocytes: 454 cells/uL (ref 200–950)
Basophils Absolute: 51 cells/uL (ref 0–200)
Basophils Relative: 1 %
Eosinophils Absolute: 102 cells/uL (ref 15–500)
Eosinophils Relative: 2 %
HCT: 43.1 % (ref 38.5–50.0)
Hemoglobin: 13.9 g/dL (ref 13.2–17.1)
Lymphs Abs: 1673 cells/uL (ref 850–3900)
MCH: 27.8 pg (ref 27.0–33.0)
MCHC: 32.3 g/dL (ref 32.0–36.0)
MCV: 86.2 fL (ref 80.0–100.0)
MPV: 11.7 fL (ref 7.5–12.5)
Monocytes Relative: 8.9 %
Neutro Abs: 2820 cells/uL (ref 1500–7800)
Neutrophils Relative %: 55.3 %
Platelets: 225 10*3/uL (ref 140–400)
RBC: 5 10*6/uL (ref 4.20–5.80)
RDW: 12.6 % (ref 11.0–15.0)
Total Lymphocyte: 32.8 %
WBC: 5.1 10*3/uL (ref 3.8–10.8)

## 2020-04-24 LAB — LIPID PANEL
Cholesterol: 163 mg/dL (ref <200)
HDL: 26 mg/dL — ABNORMAL LOW (ref >=40)
LDL Cholesterol (Calc): 102 mg/dL (calc) — ABNORMAL HIGH
Non-HDL Cholesterol (Calc): 137 mg/dL (calc) — ABNORMAL HIGH (ref <130)
Total CHOL/HDL Ratio: 6.3 (calc) — ABNORMAL HIGH (ref <5.0)
Triglycerides: 233 mg/dL — ABNORMAL HIGH (ref <150)

## 2020-04-24 LAB — IRON, TOTAL/TOTAL IRON BINDING CAP
%SAT: 16 % (calc) — ABNORMAL LOW (ref 20–48)
Iron: 55 ug/dL (ref 50–180)
TIBC: 336 mcg/dL (calc) (ref 250–425)

## 2020-04-24 LAB — URINALYSIS, ROUTINE W REFLEX MICROSCOPIC
Bilirubin Urine: NEGATIVE
Glucose, UA: NEGATIVE
Hgb urine dipstick: NEGATIVE
Ketones, ur: NEGATIVE
Leukocytes,Ua: NEGATIVE
Nitrite: NEGATIVE
Protein, ur: NEGATIVE
Specific Gravity, Urine: 1.017 (ref 1.001–1.03)
pH: 6.5 (ref 5.0–8.0)

## 2020-04-24 LAB — MAGNESIUM: Magnesium: 1.9 mg/dL (ref 1.5–2.5)

## 2020-04-24 LAB — HEMOGLOBIN A1C
Hgb A1c MFr Bld: 5.7 % of total Hgb — ABNORMAL HIGH (ref <5.7)
Mean Plasma Glucose: 117 (calc)
eAG (mmol/L): 6.5 (calc)

## 2020-04-24 LAB — TSH: TSH: 2.14 mIU/L (ref 0.40–4.50)

## 2020-04-24 LAB — PSA: PSA: 2 ng/mL (ref <=4.0)

## 2020-04-24 LAB — MICROALBUMIN / CREATININE URINE RATIO
Creatinine, Urine: 84 mg/dL (ref 20–320)
Microalb Creat Ratio: 6 mcg/mg creat (ref <30)
Microalb, Ur: 0.5 mg/dL

## 2020-04-24 LAB — VITAMIN B12: Vitamin B-12: 550 pg/mL (ref 200–1100)

## 2020-04-24 LAB — VITAMIN D 25 HYDROXY (VIT D DEFICIENCY, FRACTURES): Vit D, 25-Hydroxy: 68 ng/mL (ref 30–100)

## 2020-04-24 MED ORDER — EZETIMIBE 10 MG PO TABS
10.0000 mg | ORAL_TABLET | Freq: Every day | ORAL | 3 refills | Status: DC
Start: 2020-04-24 — End: 2023-07-14

## 2020-04-24 NOTE — Addendum Note (Signed)
Addended by: Quentin Mulling R on: 04/24/2020 08:42 AM   Modules accepted: Orders

## 2020-06-16 ENCOUNTER — Other Ambulatory Visit: Payer: Self-pay | Admitting: Internal Medicine

## 2020-06-16 MED ORDER — MELOXICAM 15 MG PO TABS: ORAL_TABLET | ORAL | 0 refills | Status: DC

## 2020-06-18 ENCOUNTER — Other Ambulatory Visit: Payer: Self-pay | Admitting: *Deleted

## 2020-06-18 DIAGNOSIS — E1129 Type 2 diabetes mellitus with other diabetic kidney complication: Secondary | ICD-10-CM

## 2020-06-18 MED ORDER — ROSUVASTATIN CALCIUM 40 MG PO TABS: ORAL_TABLET | ORAL | 0 refills | Status: DC

## 2020-06-18 MED ORDER — METFORMIN HCL ER 500 MG PO TB24: ORAL_TABLET | ORAL | 1 refills | Status: DC

## 2020-06-18 MED ORDER — GLIPIZIDE 5 MG PO TABS: ORAL_TABLET | ORAL | 0 refills | Status: DC

## 2020-07-10 ENCOUNTER — Other Ambulatory Visit: Payer: Self-pay | Admitting: Internal Medicine

## 2020-07-11 ENCOUNTER — Telehealth: Payer: Self-pay | Admitting: *Deleted

## 2020-07-11 NOTE — Telephone Encounter (Signed)
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 Patient is aware note was sent.

## 2020-07-30 ENCOUNTER — Ambulatory Visit: Payer: 59 | Admitting: Physician Assistant

## 2020-08-12 NOTE — Progress Notes (Signed)
FOLLOW UP 3 MONTH  Assessment and Plan:  Larry Hudson was seen today for follow-up.  Diagnoses and all orders for this visit:  Essential hypertension No medications, lifestyle controlled Monitor blood pressure at home; call if consistently over 130/80 Continue DASH diet.   Reminder to go to the ER if any CP, SOB, nausea, dizziness, severe HA, changes vision/speech, left arm numbness and tingling and jaw pain. -     CBC with Differential/Platelet -     COMPLETE METABOLIC PANEL WITH GFR  Hyperlipidemia associated with type 2 diabetes mellitus (HCC) Continue medications: rosuvastatin 40mg  daily Discussed dietary and exercise modifications Low fat diet -     Lipid panel  CKD stage 2 due to type 2 diabetes mellitus (HCC) Increase fluids  Avoid NSAIDS Blood pressure control Monitor sugars  Will continue to monitor  Type 2 diabetes mellitus with stage 2 chronic kidney disease, without long-term current use of insulin (HCC) Continue medications: Metformin 500mg  two tablets twice a day with food. Glipizide 5mg  taking this BID. Discussed general issues about diabetes pathophysiology and management. Education: Reviewed ABCs of diabetes management (respective goals in parentheses):  A1C (<7), blood pressure (<130/80), and cholesterol (LDL <70) Dietary recommendations Encouraged aerobic exercise.  Discussed foot care, check daily Yearly retinal exam Dental exam every 6 months Monitor blood glucose, discussed goal for patient -     Hemoglobin A1c  OSA on CPAP Continue CPAP/BiPAP, using nightly for at least 8 hours  Helping with daytime fatigue Weight loss still advised Discussed mask & tubing hygeine  Vitamin D deficiency Continue supplementation to maintain goal of 70-100 Taking Vitamin D 5,000 IU daily Defer vitamin D level   Morbid obesity BMI (HCC)  Discussed dietary and exercise modifications  Medication management Continued  Dental abscess Make appointment with Dentist  ASAP Discussed oral hygeine -     doxycycline (VIBRA-TABS) 100 MG tablet; Take 1 tablet (100 mg total) by mouth 2 (two) times daily for 7 days.    Discussed med's effects and SE's. Screening labs and tests as requested with regular follow-up as recommended. Over 30 minutes of face to face interview, exam, counseling, chart review and critical decision making was performed  Future Appointments  Date Time Provider Department Center  11/21/2020  9:30 AM , NP [facility] None  04/23/2021  9:00 AM 11/23/2020, NP [facility] None     HPI Patient presents for a 3 month follow up.  He has Essential hypertension; Hyperlipidemia; Vitamin D deficiency; Abnormal glucose; Medication management; Morbid obesity BMI (HCC) ; OSA on CPAP; Labile hypertension; CKD stage 2 due to type 2 diabetes mellitus (HCC); Type 2 diabetes mellitus with stage 2 chronic kidney disease, without long-term current use of insulin (HCC); and Hyperlipidemia associated with type 2 diabetes mellitus (HCC) on their problem list.   Reports he is having a tooth ache on his top right side.  This has been progressively getting worse over the course of the past month.  He is rinsing his mouth regularly and reports he has tried some oral gel or pain that was not affective.  He is also taking acetaminophen 1,000mg  BID.  He has DMII and he checks his blood glucose daily and had good control and checking once very two days.  Reports that his average is 90.  He had elevated A1c (14) on 01/04/20.  He improved his diet and mad lifestyle changes and last check 04/23/20. Reports that he check check his blood glucose that evening and it was 165.  It had ben about 5hours since his last meal or snack.    OSA followed by Dr. Vickey Huger and had follow up to check his machine.  He reports that he needs a new mask.  He is working with their office for this.   He is a [employer] and had DOT physical 07/19/20. He states he has a physical  job.   Previous left hip pain.  Today he reports his hips are doing well and he is no longer taking Meloxicam for this.    BMI is Body mass index is 35.15 kg/m., he has been working on diet and exercise. Wt Readings from Last 3 Encounters:  08/13/20 245 lb (111.1 kg)  04/23/20 246 lb (111.6 kg)  02/13/20 235 lb 9.6 oz (106.9 kg)   His blood pressure has been controlled at home, today their BP is BP: 132/80 He does workout (physically intense job).  He denies chest pain, shortness of breath, dizziness.   He is on cholesterol medication (Taking rosuvastatin 40 mg zetia 10 mg) and denies myalgias. His cholesterol is not at goal. The cholesterol last visit was:   Lab Results  Component Value Date   CHOL 163 04/23/2020   HDL 26 (L) 04/23/2020   LDLCALC 102 (H) 04/23/2020   TRIG 233 (H) 04/23/2020   CHOLHDL 6.3 (H) 04/23/2020   He has been working on diet and exercise for diabetes He states he has been very strict with diet, he is using stevia rather than sugar, he is off cookies/icecream. He will occ eat sugar free ice cream. He is eating dave's killer bread if he does eat. He is increasing his veggies.   With CKD not on ACE- last kidney function at goal With hyperlipidemia not at goal of 70- on crestor 40 mg changed last tiime to reach goal- having HA's- may adjust  he is on bASA  he is not on ACE/ARB  He was started on glipizide 5 mg 2 x a day Metformin 500 mg 2 pills a day and denies foot ulcerations, increased appetite, nausea, paresthesia of the feet, polydipsia, polyuria, visual disturbances, vomiting and weight loss.  Last A1C in the office was:  Lab Results  Component Value Date   HGBA1C 5.7 (H) 04/23/2020   Last GFR: Lab Results  Component Value Date   GFRNONAA 94 04/23/2020    Patient is on Vitamin D supplement.   Lab Results  Component Value Date   VD25OH 68 04/23/2020     Last PSA was: Lab Results  Component Value Date   PSA 2.0 04/23/2020    Current  Medications:   Current Outpatient Medications (Endocrine & Metabolic):    glipiZIDE (GLUCOTROL) 5 MG tablet, Take 1 tablet  3 x  /day with Meals for Diabetes   metFORMIN (GLUCOPHAGE-XR) 500 MG 24 hr tablet, Take 1 to 2 tablets 2 x /day with meals for Diabetes  Current Outpatient Medications (Cardiovascular):    ezetimibe (ZETIA) 10 MG tablet, Take 1 tablet (10 mg total) by mouth daily.   rosuvastatin (CRESTOR) 40 MG tablet, Take 1 tablet Daily for Cholesterol   Current Outpatient Medications (Analgesics):    BABY ASPIRIN PO, Take 81 mg by mouth daily.   Current Outpatient Medications (Other):    Cholecalciferol (VITAMIN D PO), Take 5,000 Int'l Units by mouth in the morning and at bedtime.    Multiple Vitamin (MULTIVITAMIN) tablet, Take 1 tablet by mouth daily.   Omega-3 Fatty Acids (FISH OIL PO), Take 1,200 mg by  mouth daily.   Thiamine HCl (VITAMIN B-1) 250 MG tablet, Take 250 mg by mouth daily.   zinc gluconate 50 MG tablet, Take 50 mg by mouth daily.   doxycycline (VIBRA-TABS) 100 MG tablet, Take 1 tablet (100 mg total) by mouth 2 (two) times daily for 7 days.  Allergies:  No Known Allergies   Health Maintenance:  Immunization History  Administered Date(s) Administered   Influenza Inj Mdck Quad With Preservative 10/10/2018   Influenza Split 10/08/2014   Influenza-Unspecified 08/07/2015   PFIZER SARS-COV-2 Vaccination 04/07/2020   PPD Test 10/08/2014, 10/21/2015   Tdap 08/14/2009   Health Maintenance  Topic Date Due   PNEUMOCOCCAL POLYSACCHARIDE VACCINE AGE 39-64 HIGH RISK  Never done   FOOT EXAM  Never done   OPHTHALMOLOGY EXAM  Never done   TETANUS/TDAP  08/15/2019   COVID-19 Vaccine (2 - Pfizer 2-dose series) 04/28/2020   INFLUENZA VACCINE  06/09/2020   HEMOGLOBIN A1C  10/23/2020   URINE MICROALBUMIN  04/23/2021   COLONOSCOPY  11/25/2021   Hepatitis C Screening  Completed   HIV Screening  Completed    Tetanus: 2010 DUE discussed with  patient, get at physical Pneumovax: DUE- delayed due to COVID vaccines, discussed with patient Prevnar 13: N/A Flu vaccine: 2020 Shingrix: declines- discussed  DEE 2019  DEXA: N/A Colonoscopy: 11/2011 10 year EGD: N/A  [place]am: [place], last 2-3 years ago, but has scheduled for this year Dentist: Last several years ago, has new insurance, will schedule appointment ASAP  Patient Care Team: Lucky Cowboy, MD as PCP - General (Internal Medicine) Sharrell Ku, MD as Consulting Physician (Gastroenterology)  Medical History:  has Essential hypertension; Hyperlipidemia; Vitamin D deficiency; Abnormal glucose; Medication management; Morbid obesity BMI (HCC) ; OSA on CPAP; Labile hypertension; CKD stage 2 due to type 2 diabetes mellitus (HCC); Type 2 diabetes mellitus with stage 2 chronic kidney disease, without long-term current use of insulin (HCC); and Hyperlipidemia associated with type 2 diabetes mellitus (HCC) on their problem list. Surgical History:  He  has a past surgical history that includes Hemorrhoid banding (2013) and Nasal septum surgery (2004). Family History:  His family history includes Emphysema in his father; Heart attack in his mother; Heart disease in his father; Hyperlipidemia in his mother. Social History:   reports that he quit smoking about 24 years ago. He has never used smokeless tobacco. He reports that he does not drink alcohol and does not use drugs. Review of Systems:  Review of Systems  Constitutional: Negative for malaise/fatigue and weight loss.  HENT: Negative for hearing loss and tinnitus.   Eyes: Negative for blurred vision and double vision.  Respiratory: Negative for cough, shortness of breath and wheezing.   Cardiovascular: Negative for chest pain, palpitations, orthopnea, claudication and leg swelling.  Gastrointestinal: Negative for abdominal pain, blood in stool, constipation, diarrhea, heartburn, melena, nausea and vomiting.   Genitourinary: Negative.   Musculoskeletal: Negative for joint pain and myalgias.  Skin: Negative for rash.  Neurological: Negative for dizziness, tingling, sensory change, weakness and headaches.  Endo/Heme/Allergies: Negative for polydipsia.  Psychiatric/Behavioral: Negative.   All other systems reviewed and are negative.   Physical Exam: Estimated body mass index is 35.15 kg/m as calculated from the following:   Height as of 04/23/20: 5\' 10"  (1.778 m).   Weight as of this encounter: 245 lb (111.1 kg). BP 132/80    Pulse 64    Temp (!) 96.4 F (35.8 C)    Wt 245 lb (111.1 kg)  SpO2 98%    BMI 35.15 kg/m  General Appearance: Well nourished, in no apparent distress.  Eyes: PERRLA, EOMs, conjunctiva no swelling or erythema, normal fundi and vessels.  Sinuses: No Frontal/maxillary tenderness  ENT/Mouth: Ext aud canals clear, normal light reflex with TMs without erythema, bulging. Erythema to  Upper gum starting at tooth number #5 extending to tooth #3. No erythema, swelling, or exudate on post pharynx. Tonsils not swollen or erythematous. Hearing normal.  Neck: Supple, thyroid normal. No bruits  Respiratory: Respiratory effort normal, BS equal bilaterally without rales, rhonchi, wheezing or stridor.  Cardio: RRR without murmurs, rubs or gallops. Brisk peripheral pulses without edema.  Chest: symmetric, with normal excursions and percussion.  Abdomen: Soft, nontender, no guarding, rebound, hernias, masses, or organomegaly.  Lymphatics: Non tender without lymphadenopathy.  Genitourinary:  Musculoskeletal: Full ROM all peripheral extremities,5/5 strength, and normal gait.  Skin: Warm, dry without rashes, lesions, ecchymosis. Neuro: Cranial nerves intact, reflexes equal bilaterally. Normal muscle tone, no cerebellar symptoms. Sensation intact.  Psych: Awake and oriented X 3, normal affect, Insight and Judgment appropriate.    Elder NegusKyra Lanea Vankirk, Edrick OhAGNP-C, DNP [facility] 08/13/2020  9:59 AM

## 2020-08-13 ENCOUNTER — Ambulatory Visit: Payer: 59 | Admitting: Adult Health Nurse Practitioner

## 2020-08-13 ENCOUNTER — Encounter: Payer: Self-pay | Admitting: Adult Health Nurse Practitioner

## 2020-08-13 ENCOUNTER — Other Ambulatory Visit: Payer: Self-pay

## 2020-08-13 VITALS — BP 132/80 | HR 64 | Temp 96.4°F | Wt 245.0 lb

## 2020-08-13 DIAGNOSIS — G4733 Obstructive sleep apnea (adult) (pediatric): Secondary | ICD-10-CM

## 2020-08-13 DIAGNOSIS — E785 Hyperlipidemia, unspecified: Secondary | ICD-10-CM | POA: Diagnosis not present

## 2020-08-13 DIAGNOSIS — E1169 Type 2 diabetes mellitus with other specified complication: Secondary | ICD-10-CM | POA: Diagnosis not present

## 2020-08-13 DIAGNOSIS — N182 Chronic kidney disease, stage 2 (mild): Secondary | ICD-10-CM

## 2020-08-13 DIAGNOSIS — E1122 Type 2 diabetes mellitus with diabetic chronic kidney disease: Secondary | ICD-10-CM

## 2020-08-13 DIAGNOSIS — Z9989 Dependence on other enabling machines and devices: Secondary | ICD-10-CM

## 2020-08-13 DIAGNOSIS — I10 Essential (primary) hypertension: Secondary | ICD-10-CM | POA: Diagnosis not present

## 2020-08-13 DIAGNOSIS — K047 Periapical abscess without sinus: Secondary | ICD-10-CM

## 2020-08-13 DIAGNOSIS — Z79899 Other long term (current) drug therapy: Secondary | ICD-10-CM

## 2020-08-13 DIAGNOSIS — E559 Vitamin D deficiency, unspecified: Secondary | ICD-10-CM

## 2020-08-13 MED ORDER — DOXYCYCLINE HYCLATE 100 MG PO TABS: 100.0000 mg | ORAL_TABLET | Freq: Two times a day (BID) | ORAL | 0 refills | Status: AC

## 2020-08-13 NOTE — Patient Instructions (Signed)
   We have sent in antibiotics for your mouth.  Please schedule follow up with Dentist ASAP.  Keep up with good work with your dietand blood glucose!!!  We will call you with lab results in 1-3 days

## 2020-08-14 LAB — CBC WITH DIFFERENTIAL/PLATELET
Absolute Monocytes: 507 cells/uL (ref 200–950)
Basophils Absolute: 41 cells/uL (ref 0–200)
Basophils Relative: 0.7 %
Eosinophils Absolute: 100 cells/uL (ref 15–500)
Eosinophils Relative: 1.7 %
HCT: 43.8 % (ref 38.5–50.0)
Hemoglobin: 14.4 g/dL (ref 13.2–17.1)
Lymphs Abs: 2154 cells/uL (ref 850–3900)
MCH: 27.7 pg (ref 27.0–33.0)
MCHC: 32.9 g/dL (ref 32.0–36.0)
MCV: 84.2 fL (ref 80.0–100.0)
MPV: 11.3 fL (ref 7.5–12.5)
Monocytes Relative: 8.6 %
Neutro Abs: 3098 cells/uL (ref 1500–7800)
Neutrophils Relative %: 52.5 %
Platelets: 221 10*3/uL (ref 140–400)
RBC: 5.2 10*6/uL (ref 4.20–5.80)
RDW: 13.9 % (ref 11.0–15.0)
Total Lymphocyte: 36.5 %
WBC: 5.9 10*3/uL (ref 3.8–10.8)

## 2020-08-14 LAB — COMPLETE METABOLIC PANEL WITH GFR
AG Ratio: 1.9 (calc) (ref 1.0–2.5)
ALT: 20 U/L (ref 9–46)
AST: 19 U/L (ref 10–35)
Albumin: 4.7 g/dL (ref 3.6–5.1)
Alkaline phosphatase (APISO): 55 U/L (ref 35–144)
BUN: 14 mg/dL (ref 7–25)
CO2: 27 mmol/L (ref 20–32)
Calcium: 9.6 mg/dL (ref 8.6–10.3)
Chloride: 105 mmol/L (ref 98–110)
Creat: 0.96 mg/dL (ref 0.70–1.33)
GFR, Est African American: 100 mL/min/{1.73_m2} (ref >=60)
GFR, Est Non African American: 86 mL/min/{1.73_m2} (ref >=60)
Globulin: 2.5 g/dL (calc) (ref 1.9–3.7)
Glucose, Bld: 111 mg/dL — ABNORMAL HIGH (ref 65–99)
Potassium: 4.3 mmol/L (ref 3.5–5.3)
Sodium: 140 mmol/L (ref 135–146)
Total Bilirubin: 1.2 mg/dL (ref 0.2–1.2)
Total Protein: 7.2 g/dL (ref 6.1–8.1)

## 2020-08-14 LAB — LIPID PANEL
Cholesterol: 164 mg/dL (ref <200)
HDL: 28 mg/dL — ABNORMAL LOW (ref >=40)
LDL Cholesterol (Calc): 110 mg/dL (calc) — ABNORMAL HIGH
Non-HDL Cholesterol (Calc): 136 mg/dL (calc) — ABNORMAL HIGH (ref <130)
Total CHOL/HDL Ratio: 5.9 (calc) — ABNORMAL HIGH (ref <5.0)
Triglycerides: 148 mg/dL (ref <150)

## 2020-08-14 LAB — HEMOGLOBIN A1C
Hgb A1c MFr Bld: 5.6 % of total Hgb (ref <5.7)
Mean Plasma Glucose: 114 (calc)
eAG (mmol/L): 6.3 (calc)

## 2020-08-15 NOTE — Progress Notes (Signed)
Patient is aware of lab results and instructions. -E. Welch

## 2020-10-06 ENCOUNTER — Other Ambulatory Visit: Payer: Self-pay | Admitting: Internal Medicine

## 2020-11-21 ENCOUNTER — Ambulatory Visit: Payer: 59 | Admitting: Adult Health Nurse Practitioner

## 2020-12-10 ENCOUNTER — Other Ambulatory Visit: Payer: Self-pay

## 2020-12-10 ENCOUNTER — Encounter: Payer: Self-pay | Admitting: Adult Health Nurse Practitioner

## 2020-12-10 ENCOUNTER — Ambulatory Visit: Payer: 59 | Admitting: Adult Health Nurse Practitioner

## 2020-12-10 VITALS — BP 136/78 | HR 75 | Temp 97.5°F | Wt 260.0 lb

## 2020-12-10 DIAGNOSIS — E785 Hyperlipidemia, unspecified: Secondary | ICD-10-CM

## 2020-12-10 DIAGNOSIS — E1122 Type 2 diabetes mellitus with diabetic chronic kidney disease: Secondary | ICD-10-CM

## 2020-12-10 DIAGNOSIS — I10 Essential (primary) hypertension: Secondary | ICD-10-CM

## 2020-12-10 DIAGNOSIS — N182 Chronic kidney disease, stage 2 (mild): Secondary | ICD-10-CM

## 2020-12-10 DIAGNOSIS — M25552 Pain in left hip: Secondary | ICD-10-CM

## 2020-12-10 DIAGNOSIS — Z79899 Other long term (current) drug therapy: Secondary | ICD-10-CM

## 2020-12-10 DIAGNOSIS — E1169 Type 2 diabetes mellitus with other specified complication: Secondary | ICD-10-CM | POA: Diagnosis not present

## 2020-12-10 DIAGNOSIS — E559 Vitamin D deficiency, unspecified: Secondary | ICD-10-CM

## 2020-12-10 DIAGNOSIS — Z9989 Dependence on other enabling machines and devices: Secondary | ICD-10-CM

## 2020-12-10 DIAGNOSIS — G4733 Obstructive sleep apnea (adult) (pediatric): Secondary | ICD-10-CM

## 2020-12-10 MED ORDER — DEXAMETHASONE 4 MG PO TABS: ORAL_TABLET | ORAL | 1 refills | Status: DC

## 2020-12-10 NOTE — Progress Notes (Signed)
FOLLOW UP 3 MONTH  Assessment and Plan:  Larry Hudson was seen today for follow-up.  Diagnoses and all orders for this visit:  Essential hypertension No medications, lifestyle controlled Monitor blood pressure at home; call if consistently over 130/80 Continue DASH diet.   Reminder to go to the ER if any CP, SOB, nausea, dizziness, severe HA, changes vision/speech, left arm numbness and tingling and jaw pain. -     CBC with Differential/Platelet -     COMPLETE METABOLIC PANEL WITH GFR  Hyperlipidemia associated with type 2 diabetes mellitus (HCC) Continue medications: rosuvastatin 40mg  daily Discussed dietary and exercise modifications Low fat diet -     Lipid panel  CKD stage 2 due to type 2 diabetes mellitus (HCC) Increase fluids  Avoid NSAIDS Blood pressure control Monitor sugars  Will continue to monitor  Type 2 diabetes mellitus with stage 2 chronic kidney disease, without long-term current use of insulin (HCC) Continue medications: Metformin 500mg  two tablets twice a day with food. Glipizide 5mg  taking this BID. Discussed general issues about diabetes pathophysiology and management. Education: Reviewed 'ABCs' of diabetes management (respective goals in parentheses):  A1C (<7), blood pressure (<130/80), and cholesterol (LDL <70) Dietary recommendations Encouraged aerobic exercise.  Discussed foot care, check daily Yearly retinal exam Dental exam every 6 months Monitor blood glucose, discussed goal for patient -     Hemoglobin A1c  OSA on CPAP Continue CPAP/BiPAP, using nightly for at least 8 hours  Helping with daytime fatigue Weight loss still advised Discussed mask & tubing hygeine  Vitamin D deficiency Continue supplementation to maintain goal of 70-100 Taking Vitamin D 5,000 IU daily Defer vitamin D level  Left Hip Pain -Dexamethasone taper Take with food Discussed medication and side effects. Consider Ortho if no improvement  Morbid obesity BMI (HCC)   Discussed dietary and exercise modifications  Medication management Continued   Discussed med's effects and SE's. Screening labs and tests as requested with regular follow-up as recommended. Over 30 minutes of face to face interview, exam, counseling, chart review and critical decision making was performed  Future Appointments  Date Time Provider Department Center  04/23/2021  9:00 AM , NP [facility] None     HPI Patient presents for a 3 month follow up.  He has Essential hypertension; Hyperlipidemia; Vitamin D deficiency; Abnormal glucose; Medication management; Morbid obesity BMI (HCC) ; OSA on CPAP; Labile hypertension; CKD stage 2 due to type 2 diabetes mellitus (HCC); Type 2 diabetes mellitus with stage 2 chronic kidney disease, without long-term current use of insulin (HCC); and Hyperlipidemia associated with type 2 diabetes mellitus (HCC) on their problem list.   Her reports he is having right hip/leg pain, that started 4 months ago.  He has taken Meloxicam for a short time that helped.  Pain is in his right groin, cramp or twisting type pain.  It is worse in the morning or strenuous lifting.  He is taking tylenol 1,000mg  every couple days from 6/10 to 2/10.  No change or impairment with walking but is becoming increasing painful.  He is diabetic low 82 and highest w/ one outlier 140's.  He checks this every other day.  He is taking Meformin, three tablets a day and is taking glipizide once a day.     From previous OV: 04/23/20 Reports he is having a tooth ache on his top right side.  This has been progressively getting worse over the course of the past month.  He is rinsing his mouth regularly  and reports he has tried some oral gel or pain that was not affective.  He is also taking acetaminophen 1,000mg  BID.  He has DMII and he checks his blood glucose daily and had good control and checking once very two days.  Reports that his average is 90.  He had elevated A1c  (14) on 01/04/20.  He improved his diet and mad lifestyle changes and last check 04/23/20. Reports that he check check his blood glucose that evening and it was 165.  It had ben about 5hours since his last meal or snack.    OSA followed by Dr. Vickey Huger and had follow up to check his machine.  He reports that he needs a new mask.  He is working with their office for this.   He is a [employer] and had DOT physical 07/19/20. He states he has a physical job.   Previous left hip pain.  Today he reports his hips are doing well and he is no longer taking Meloxicam for this.    BMI is Body mass index is 37.31 kg/m., he has been working on diet and exercise. Wt Readings from Last 3 Encounters:  12/10/20 260 lb (117.9 kg)  08/13/20 245 lb (111.1 kg)  04/23/20 246 lb (111.6 kg)   His blood pressure has been controlled at home, today their BP is BP: 136/78 He does workout (physically intense job).  He denies chest pain, shortness of breath, dizziness.   He is on cholesterol medication (Taking rosuvastatin 40 mg zetia 10 mg) and denies myalgias. His cholesterol is not at goal. The cholesterol last visit was:   Lab Results  Component Value Date   CHOL 164 08/13/2020   HDL 28 (L) 08/13/2020   LDLCALC 110 (H) 08/13/2020   TRIG 148 08/13/2020   CHOLHDL 5.9 (H) 08/13/2020   He has been working on diet and exercise for diabetes He states he has been very strict with diet, he is using stevia rather than sugar, he is off cookies/icecream. He will occ eat sugar free ice cream. He is eating dave's killer bread if he does eat. He is increasing his veggies.   With CKD not on ACE- last kidney function at goal With hyperlipidemia not at goal of 70- on crestor 40 mg changed last tiime to reach goal- having HA's- may adjust  he is on bASA  he is not on ACE/ARB  He was started on glipizide 5 mg 2 x a day Metformin 500 mg 2 pills a day and denies foot ulcerations, increased appetite, nausea, paresthesia of  the feet, polydipsia, polyuria, visual disturbances, vomiting and weight loss.  Last A1C in the office was:  Lab Results  Component Value Date   HGBA1C 5.6 08/13/2020   Last GFR: Lab Results  Component Value Date   GFRNONAA 86 08/13/2020    Patient is on Vitamin D supplement.   Lab Results  Component Value Date   VD25OH 68 04/23/2020     Last PSA was: Lab Results  Component Value Date   PSA 2.0 04/23/2020    Current Medications:   Current Outpatient Medications (Endocrine & Metabolic):  .  glipiZIDE (GLUCOTROL) 5 MG tablet, Take 1 tablet  3 x  /day with Meals for Diabetes .  metFORMIN (GLUCOPHAGE-XR) 500 MG 24 hr tablet, Take 1 to 2 tablets 2 x /day with meals for Diabetes  Current Outpatient Medications (Cardiovascular):  .  ezetimibe (ZETIA) 10 MG tablet, Take 1 tablet (10 mg total) by mouth  daily. .  rosuvastatin (CRESTOR) 40 MG tablet, Take       1 tablet      Daily        for Cholesterol   Current Outpatient Medications (Analgesics):  Marland Kitchen  BABY ASPIRIN PO, Take 81 mg by mouth daily.   Current Outpatient Medications (Other):  Marland Kitchen  Cholecalciferol (VITAMIN D PO), Take 5,000 Int'l Units by mouth in the morning and at bedtime.  .  Multiple Vitamin (MULTIVITAMIN) tablet, Take 1 tablet by mouth daily. .  Omega-3 Fatty Acids (FISH OIL PO), Take 1,200 mg by mouth daily. .  Thiamine HCl (VITAMIN B-1) 250 MG tablet, Take 250 mg by mouth daily. Marland Kitchen  zinc gluconate 50 MG tablet, Take 50 mg by mouth daily.  Allergies:  No Known Allergies   Health Maintenance:  Immunization History  Administered Date(s) Administered  . Influenza Inj Mdck Quad With Preservative 10/10/2018  . Influenza Split 10/08/2014  . Influenza-Unspecified 08/07/2015  . PFIZER(Purple Top)SARS-COV-2 Vaccination 04/07/2020  . PPD Test 10/08/2014, 10/21/2015  . Tdap 08/14/2009   Health Maintenance  Topic Date Due  . PNEUMOCOCCAL POLYSACCHARIDE VACCINE AGE 32-64 HIGH RISK  Never done  . FOOT EXAM  Never done   . OPHTHALMOLOGY EXAM  Never done  . TETANUS/TDAP  08/15/2019  . COVID-19 Vaccine (2 - Pfizer 3-dose series) 04/28/2020  . INFLUENZA VACCINE  06/09/2020  . HEMOGLOBIN A1C  02/11/2021  . URINE MICROALBUMIN  04/23/2021  . COLONOSCOPY (Pts 45-73yrs Insurance coverage will need to be confirmed)  11/25/2021  . Hepatitis C Screening  Completed  . HIV Screening  Completed    Tetanus: 2010 DUE discussed with patient, get at physical Pneumovax: DUE- delayed due to COVID vaccines, discussed with patient Prevnar 13: N/A Flu vaccine: 2020 Shingrix: declines- discussed  DEE 2019  DEXA: N/A Colonoscopy: 11/2011 10 year EGD: N/A  [place]am[place], last 2-3 years ago, but has scheduled for this year Dentist: Last several years ago, has new insurance, will schedule appointment ASAP  Patient Care Team: Lucky Cowboy, MD as PCP - General (Internal Medicine) Sharrell Ku, MD as Consulting Physician (Gastroenterology)  Medical History:  has Essential hypertension; Hyperlipidemia; Vitamin D deficiency; Abnormal glucose; Medication management; Morbid obesity BMI (HCC) ; OSA on CPAP; Labile hypertension; CKD stage 2 due to type 2 diabetes mellitus (HCC); Type 2 diabetes mellitus with stage 2 chronic kidney disease, without long-term current use of insulin (HCC); and Hyperlipidemia associated with type 2 diabetes mellitus (HCC) on their problem list. Surgical History:  He  has a past surgical history that includes Hemorrhoid banding (2013) and Nasal septum surgery (2004). Family History:  His family history includes Emphysema in his father; Heart attack in his mother; Heart disease in his father; Hyperlipidemia in his mother. Social History:   reports that he quit smoking about 24 years ago. He has never used smokeless tobacco. He reports that he does not drink alcohol and does not use drugs. Review of Systems:  Review of Systems  Constitutional: Negative for malaise/fatigue and  weight loss.  HENT: Negative for hearing loss and tinnitus.   Eyes: Negative for blurred vision and double vision.  Respiratory: Negative for cough, shortness of breath and wheezing.   Cardiovascular: Negative for chest pain, palpitations, orthopnea, claudication and leg swelling.  Gastrointestinal: Negative for abdominal pain, blood in stool, constipation, diarrhea, heartburn, melena, nausea and vomiting.  Genitourinary: Negative.   Musculoskeletal: Negative for joint pain and myalgias.  Skin: Negative for rash.  Neurological:  Negative for dizziness, tingling, sensory change, weakness and headaches.  Endo/Heme/Allergies: Negative for polydipsia.  Psychiatric/Behavioral: Negative.   All other systems reviewed and are negative.   Physical Exam: Estimated body mass index is 37.31 kg/m as calculated from the following:   Height as of 04/23/20: 5\' 10"  (1.778 m).   Weight as of this encounter: 260 lb (117.9 kg). BP 136/78   Pulse 75   Temp (!) 97.5 F (36.4 C)   Wt 260 lb (117.9 kg)   SpO2 98%   BMI 37.31 kg/m  General Appearance: Well nourished, in no apparent distress.  Eyes: PERRLA, EOMs, conjunctiva no swelling or erythema, normal fundi and vessels.  Sinuses: No Frontal/maxillary tenderness  ENT/Mouth: Ext aud canals clear, normal light reflex with TMs without erythema, bulging. Erythema to  Upper gum starting at tooth number #5 extending to tooth #3. No erythema, swelling, or exudate on post pharynx. Tonsils not swollen or erythematous. Hearing normal.  Neck: Supple, thyroid normal. No bruits  Respiratory: Respiratory effort normal, BS equal bilaterally without rales, rhonchi, wheezing or stridor.  Cardio: RRR without murmurs, rubs or gallops. Brisk peripheral pulses without edema.  Chest: symmetric, with normal excursions and percussion.  Abdomen: Soft, nontender, no guarding, rebound, hernias, masses, or organomegaly.  Lymphatics: Non tender without lymphadenopathy.   Genitourinary:  Musculoskeletal: Full ROM all peripheral extremities,5/5 strength, and normal gait.  Skin: Warm, dry without rashes, lesions, ecchymosis. Neuro: Cranial nerves intact, reflexes equal bilaterally. Normal muscle tone, no cerebellar symptoms. Sensation intact.  Psych: Awake and oriented X 3, normal affect, Insight and Judgment appropriate.     Elder Negus, Edrick Oh, DNP [facility] 12/10/2020  4:00 PM

## 2020-12-11 LAB — CBC WITH DIFFERENTIAL/PLATELET
Absolute Monocytes: 563 cells/uL (ref 200–950)
Basophils Absolute: 60 cells/uL (ref 0–200)
Basophils Relative: 0.9 %
Eosinophils Absolute: 161 cells/uL (ref 15–500)
Eosinophils Relative: 2.4 %
HCT: 44 % (ref 38.5–50.0)
Hemoglobin: 14.8 g/dL (ref 13.2–17.1)
Lymphs Abs: 2673 cells/uL (ref 850–3900)
MCH: 28.5 pg (ref 27.0–33.0)
MCHC: 33.6 g/dL (ref 32.0–36.0)
MCV: 84.6 fL (ref 80.0–100.0)
MPV: 11.5 fL (ref 7.5–12.5)
Monocytes Relative: 8.4 %
Neutro Abs: 3243 cells/uL (ref 1500–7800)
Neutrophils Relative %: 48.4 %
Platelets: 261 10*3/uL (ref 140–400)
RBC: 5.2 10*6/uL (ref 4.20–5.80)
RDW: 13 % (ref 11.0–15.0)
Total Lymphocyte: 39.9 %
WBC: 6.7 10*3/uL (ref 3.8–10.8)

## 2020-12-11 LAB — COMPLETE METABOLIC PANEL WITH GFR
AG Ratio: 2 (calc) (ref 1.0–2.5)
ALT: 27 U/L (ref 9–46)
AST: 24 U/L (ref 10–35)
Albumin: 4.7 g/dL (ref 3.6–5.1)
Alkaline phosphatase (APISO): 56 U/L (ref 35–144)
BUN: 17 mg/dL (ref 7–25)
CO2: 28 mmol/L (ref 20–32)
Calcium: 10.3 mg/dL (ref 8.6–10.3)
Chloride: 106 mmol/L (ref 98–110)
Creat: 1.02 mg/dL (ref 0.70–1.33)
GFR, Est African American: 93 mL/min/{1.73_m2} (ref >=60)
GFR, Est Non African American: 80 mL/min/{1.73_m2} (ref >=60)
Globulin: 2.4 g/dL (calc) (ref 1.9–3.7)
Glucose, Bld: 116 mg/dL — ABNORMAL HIGH (ref 65–99)
Potassium: 4 mmol/L (ref 3.5–5.3)
Sodium: 142 mmol/L (ref 135–146)
Total Bilirubin: 1 mg/dL (ref 0.2–1.2)
Total Protein: 7.1 g/dL (ref 6.1–8.1)

## 2020-12-11 LAB — LIPID PANEL
Cholesterol: 194 mg/dL (ref <200)
HDL: 27 mg/dL — ABNORMAL LOW (ref >=40)
Non-HDL Cholesterol (Calc): 167 mg/dL (calc) — ABNORMAL HIGH (ref <130)
Total CHOL/HDL Ratio: 7.2 (calc) — ABNORMAL HIGH (ref <5.0)
Triglycerides: 538 mg/dL — ABNORMAL HIGH (ref <150)

## 2020-12-11 LAB — HEMOGLOBIN A1C
Hgb A1c MFr Bld: 6 % of total Hgb — ABNORMAL HIGH (ref <5.7)
Mean Plasma Glucose: 126 mg/dL
eAG (mmol/L): 7 mmol/L

## 2020-12-24 ENCOUNTER — Other Ambulatory Visit: Payer: Self-pay | Admitting: Internal Medicine

## 2020-12-24 DIAGNOSIS — E1129 Type 2 diabetes mellitus with other diabetic kidney complication: Secondary | ICD-10-CM

## 2020-12-30 ENCOUNTER — Ambulatory Visit: Payer: 59 | Admitting: Adult Health Nurse Practitioner

## 2021-01-08 ENCOUNTER — Other Ambulatory Visit: Payer: Self-pay | Admitting: Internal Medicine

## 2021-03-30 ENCOUNTER — Other Ambulatory Visit: Payer: Self-pay | Admitting: Internal Medicine

## 2021-04-23 ENCOUNTER — Encounter: Payer: 59 | Admitting: Adult Health Nurse Practitioner

## 2021-05-09 ENCOUNTER — Other Ambulatory Visit: Payer: Self-pay | Admitting: Internal Medicine

## 2021-05-12 ENCOUNTER — Other Ambulatory Visit: Payer: Self-pay | Admitting: Internal Medicine

## 2021-05-12 MED ORDER — MELOXICAM 15 MG PO TABS: ORAL_TABLET | ORAL | 0 refills | Status: DC

## 2021-08-06 NOTE — Progress Notes (Signed)
Complete Physical  Assessment and Plan:  Encounter for routine adult health examination without abnormal findings 1 year CPE  Essential hypertension - continue medications, DASH diet, exercise and monitor at home. Call if greater than 130/80.  -     CBC with Differential/Platelet -     COMPLETE METABOLIC PANEL WITH GFR -     TSH -     Urinalysis, Routine w reflex microscopic -     Microalbumin / creatinine urine ratio -     EKG 12-Lead -     [place], RETROPERITNL ABD,  LTD  Morbid obesity BMI (HCC)  - follow up 3 months for progress monitoring - increase veggies, decrease carbs - long discussion about weight loss, diet, and exercise  Medication management -     Magnesium  Vitamin D deficiency -     VITAMIN D 25 Hydroxy (Vit-D Deficiency, Fractures)  OSA on CPAP Continue CPAP  Hyperlipidemia associated with type 2 diabetes mellitus (HCC) -     Lipid panel The rouvastatin 40mg - cut in 1/2 and can try morning or night.  If cholesterol is not at goal will add back on zetia.   Type 2 diabetes mellitus with stage 2 chronic kidney disease, without long-term current use of insulin (HCC) -     Hemoglobin A1c Discussed general issues about diabetes pathophysiology and management., Educational material distributed., Suggested low cholesterol diet., Encouraged aerobic exercise., Discussed foot care., Reminded to get yearly retinal exam.  CKD stage 2 due to type 2 diabetes mellitus (HCC) -     COMPLETE METABOLIC PANEL WITH GFR -     Hemoglobin A1c Increase fluids, avoid NSAIDS, monitor sugars, will monitor  Screening, anemia, deficiency, iron -     Iron, Total/Total Iron Binding Cap -     Vitamin B12  Screening PSA (prostate specific antigen) -     PSA  Strain of left hip adductor muscle, initial encounter -     DG HIP UNILAT W OR W/O PELVIS 2-3 VIEWS LEFT; Future -     meloxicam (MOBIC) 15 MG tablet; Take one daily with food for 2 weeks, can take with tylenol, can not take with  aleve, iburpofen, then as needed daily for pain - if not better get Xray  Discussed med's effects and SE's. Screening labs and tests as requested with regular follow-up as recommended. Over 40 minutes of exam, counseling, chart review and critical decision making was performed  Future Appointments  Date Time Provider Department Center  08/08/2021 10:00 AM 08/10/2021, NP [facility] None     HPI Patient presents for a complete physical. Works in [name]. He has Essential hypertension; Hyperlipidemia; Vitamin D deficiency; Abnormal glucose; Medication management; Morbid obesity BMI (HCC) ; OSA on CPAP; Labile hypertension; CKD stage 2 due to type 2 diabetes mellitus (HCC); Type 2 diabetes mellitus with stage 2 chronic kidney disease, without long-term current use of insulin (HCC); and Hyperlipidemia associated with type 2 diabetes mellitus (HCC) on their problem list.   OSA followed by Dr. Metallurgist with follow up scheduled in September.   He also has DOT physical, as he is a [place]. He states he has a physical job. He does not recall an injury at work but states he lifts a lot. For 2 weeks he has had left inguinal/thigh pain, feels like pulled muscle, no radiation, worse when he first wakes up in the morning. Better with ibuprofen or aleve and after walking. No pain down his leg.  BMI is There is no height or weight on file to calculate BMI., he has been working on diet and exercise. Wt Readings from Last 3 Encounters:  12/10/20 260 lb (117.9 kg)  08/13/20 245 lb (111.1 kg)  04/23/20 246 lb (111.6 kg)   His blood pressure has been controlled at home, today their BP is   He does workout (physically intense job). tHe denies chest pain, shortness of breath, dizziness.   He is on cholesterol medication (switched to crestor 40 mg zetia 10 mg) and denies myalgias. His cholesterol is not at goal. The cholesterol last visit was:   Lab Results  Component Value Date   CHOL 194  12/10/2020   HDL 27 (L) 12/10/2020   LDLCALC  12/10/2020     Comment:     . LDL cholesterol not calculated. Triglyceride levels greater than 400 mg/dL invalidate calculated LDL results. . Reference range: <100 . Desirable range <100 mg/dL for primary prevention;   <70 mg/dL for patients with CHD or diabetic patients  with > or = 2 CHD risk factors. Marland Kitchen LDL-C is now calculated using the Martin-Hopkins  calculation, which is a validated novel method providing  better accuracy than the Friedewald equation in the  estimation of LDL-C.  [name] Lenox Ahr. 9528;413(24): 2061-2068  (http://education.[facility].com/faq/FAQ164)    TRIG 538 (H) 12/10/2020   CHOLHDL 7.2 (H) 12/10/2020   He has been working on diet and exercise for diabetes He states he has been very strict with diet, he is using stevia rather than sugar, he is off cookies/icecream. He will occ eat sugar free ice cream. He is eating dave's killer bread if he does eat. He is increasing his veggies.    With CKD not on ACE- last kidney function at goal With hyperlipidemia not at goal of 70- on crestor 40 mg changed last tiime to reach goal- having HA's- may adjust  he is on bASA  he is not on ACE/ARB  He was started on glipizide 5 mg 2 x a day Metformin 500 mg 2 pills a day and denies foot ulcerations, increased appetite, nausea, paresthesia of the feet, polydipsia, polyuria, visual disturbances, vomiting and weight loss.  Last A1C in the office was:  Lab Results  Component Value Date   HGBA1C 6.0 (H) 12/10/2020   Last GFR: Lab Results  Component Value Date   GFRNONAA 80 12/10/2020    Patient is on Vitamin D supplement.   Lab Results  Component Value Date   VD25OH 68 04/23/2020     Last PSA was: Lab Results  Component Value Date   PSA 2.0 04/23/2020    Current Medications:   Current Outpatient Medications (Endocrine & Metabolic):    glipiZIDE (GLUCOTROL) 5 MG tablet, TAKE 1 TABLET BY MOUTH THREE  TIMES DAILY WITH MEALS FOR DIABETES   metFORMIN (GLUCOPHAGE-XR) 500 MG 24 hr tablet, TAKE 1 TO 2 TABLETS BY MOUTH TWICE DAILY WITH MEALS FOR DIABETES  Current Outpatient Medications (Cardiovascular):    ezetimibe (ZETIA) 10 MG tablet, Take 1 tablet (10 mg total) by mouth daily.   rosuvastatin (CRESTOR) 40 MG tablet, Take  1 tablet  Daily  for Cholesterol   Current Outpatient Medications (Analgesics):    BABY ASPIRIN PO, Take 81 mg by mouth daily.   meloxicam (MOBIC) 15 MG tablet, Take 1/2 to 1 tablet Daily  with Food as needed for Pain & Inflammation & try limit to 5 days /week to AVOid Permanent Kidney Damage / Patient knows  to take by mouth  !   Current Outpatient Medications (Other):    Cholecalciferol (VITAMIN D PO), Take 5,000 Int'l Units by mouth in the morning and at bedtime.    Multiple Vitamin (MULTIVITAMIN) tablet, Take 1 tablet by mouth daily.   Omega-3 Fatty Acids (FISH OIL PO), Take 1,200 mg by mouth daily.   Thiamine HCl (VITAMIN B-1) 250 MG tablet, Take 250 mg by mouth daily.   zinc gluconate 50 MG tablet, Take 50 mg by mouth daily.  Allergies:  No Known Allergies   Health Maintenance:  Immunization History  Administered Date(s) Administered   Influenza Inj Mdck Quad With Preservative 10/10/2018   Influenza Split 10/08/2014   Influenza-Unspecified 08/07/2015   PFIZER(Purple Top)SARS-COV-2 Vaccination 04/07/2020   PPD Test 10/08/2014, 10/21/2015   Tdap 08/14/2009   Health Maintenance  Topic Date Due   FOOT EXAM  Never done   OPHTHALMOLOGY EXAM  Never done   Zoster Vaccines- Shingrix (1 of 2) Never done   TETANUS/TDAP  08/15/2019   COVID-19 Vaccine (2 - Pfizer risk series) 04/28/2020   URINE MICROALBUMIN  04/23/2021   INFLUENZA VACCINE  06/09/2021   HEMOGLOBIN A1C  06/09/2021   COLONOSCOPY (Pts 45-74yrs Insurance coverage will need to be confirmed)  11/25/2021   Hepatitis C Screening  Completed   HIV Screening  Completed   HPV VACCINES  Aged Out     Tetanus: 2010 DUE but getting COVID vaccines Pneumovax: DUE- delay due to COVID vaccines Prevnar 13: N/A Flu vaccine: 2020 Shingrix: declines- discussed   DEE 2019  DEXA: N/A Colonoscopy: 11/2011 10 year EGD: N/A  [place] Exam: [place], last 2-3 years ago, but has scheduled for this year Dentist: Last several years ago, has new insurance, will schedule appointment  Patient Care Team: Lucky Cowboy, MD as PCP - General (Internal Medicine) Sharrell Ku, MD as Consulting Physician (Gastroenterology)  Medical History:  has Essential hypertension; Hyperlipidemia; Vitamin D deficiency; Abnormal glucose; Medication management; Morbid obesity BMI (HCC) ; OSA on CPAP; Labile hypertension; CKD stage 2 due to type 2 diabetes mellitus (HCC); Type 2 diabetes mellitus with stage 2 chronic kidney disease, without long-term current use of insulin (HCC); and Hyperlipidemia associated with type 2 diabetes mellitus (HCC) on their problem list. Surgical History:  He  has a past surgical history that includes Hemorrhoid banding (2013) and Nasal septum surgery (2004). Family History:  His family history includes Emphysema in his father; Heart attack in his mother; Heart disease in his father; Hyperlipidemia in his mother. Social History:   reports that he quit smoking about 25 years ago. He has never used smokeless tobacco. He reports that he does not drink alcohol and does not use drugs. Review of Systems:  Review of Systems  Constitutional:  Negative for malaise/fatigue and weight loss.  HENT:  Negative for hearing loss and tinnitus.   Eyes:  Negative for blurred vision and double vision.  Respiratory:  Negative for cough, shortness of breath and wheezing.   Cardiovascular:  Negative for chest pain, palpitations, orthopnea, claudication and leg swelling.  Gastrointestinal:  Negative for abdominal pain, blood in stool, constipation, diarrhea, heartburn, melena, nausea and vomiting.   Genitourinary: Negative.   Musculoskeletal:  Negative for joint pain and myalgias.  Skin:  Negative for rash.  Neurological:  Negative for dizziness, tingling, sensory change, weakness and headaches.  Endo/Heme/Allergies:  Negative for polydipsia.  Psychiatric/Behavioral: Negative.    All other systems reviewed and are negative.  Physical Exam: Estimated body mass index  is 37.31 kg/m as calculated from the following:   Height as of 04/23/20: 5\' 10"  (1.778 m).   Weight as of 12/10/20: 260 lb (117.9 kg). There were no vitals taken for this visit. General Appearance: Well nourished, in no apparent distress.  Eyes: PERRLA, EOMs, conjunctiva no swelling or erythema, normal fundi and vessels.  Sinuses: No Frontal/maxillary tenderness  ENT/Mouth: Ext aud canals clear, normal light reflex with TMs without erythema, bulging. Good dentition. No erythema, swelling, or exudate on post pharynx. Tonsils not swollen or erythematous. Hearing normal.  Neck: Supple, thyroid normal. No bruits  Respiratory: Respiratory effort normal, BS equal bilaterally without rales, rhonchi, wheezing or stridor.  Cardio: RRR without murmurs, rubs or gallops. Brisk peripheral pulses without edema.  Chest: symmetric, with normal excursions and percussion.  Abdomen: Soft, nontender, no guarding, rebound, hernias, masses, or organomegaly.  Lymphatics: Non tender without lymphadenopathy.  Genitourinary:  Musculoskeletal: Full ROM all peripheral extremities,5/5 strength, and normal gait.  Skin: Warm, dry without rashes, lesions, ecchymosis. Neuro: Cranial nerves intact, reflexes equal bilaterally. Normal muscle tone, no cerebellar symptoms. Sensation intact.  Psych: Awake and oriented X 3, normal affect, Insight and Judgment appropriate.   EKG: WNL no changes. Aorta Scan WNL  Larsen Zettel W Kahdijah Errickson 1:39 PM [facility]

## 2021-08-08 ENCOUNTER — Ambulatory Visit (INDEPENDENT_AMBULATORY_CARE_PROVIDER_SITE_OTHER): Payer: 59 | Admitting: Nurse Practitioner

## 2021-08-08 ENCOUNTER — Other Ambulatory Visit: Payer: Self-pay

## 2021-08-08 VITALS — BP 124/78 | HR 70 | Temp 97.7°F | Ht 70.0 in | Wt 260.8 lb

## 2021-08-08 DIAGNOSIS — E1122 Type 2 diabetes mellitus with diabetic chronic kidney disease: Secondary | ICD-10-CM

## 2021-08-08 DIAGNOSIS — R350 Frequency of micturition: Secondary | ICD-10-CM | POA: Diagnosis not present

## 2021-08-08 DIAGNOSIS — E559 Vitamin D deficiency, unspecified: Secondary | ICD-10-CM

## 2021-08-08 DIAGNOSIS — E1169 Type 2 diabetes mellitus with other specified complication: Secondary | ICD-10-CM

## 2021-08-08 DIAGNOSIS — Z1389 Encounter for screening for other disorder: Secondary | ICD-10-CM | POA: Diagnosis not present

## 2021-08-08 DIAGNOSIS — Z79899 Other long term (current) drug therapy: Secondary | ICD-10-CM | POA: Diagnosis not present

## 2021-08-08 DIAGNOSIS — N401 Enlarged prostate with lower urinary tract symptoms: Secondary | ICD-10-CM

## 2021-08-08 DIAGNOSIS — Z0001 Encounter for general adult medical examination with abnormal findings: Secondary | ICD-10-CM

## 2021-08-08 DIAGNOSIS — I10 Essential (primary) hypertension: Secondary | ICD-10-CM

## 2021-08-08 DIAGNOSIS — Z125 Encounter for screening for malignant neoplasm of prostate: Secondary | ICD-10-CM | POA: Diagnosis not present

## 2021-08-08 DIAGNOSIS — G4733 Obstructive sleep apnea (adult) (pediatric): Secondary | ICD-10-CM

## 2021-08-08 DIAGNOSIS — Z131 Encounter for screening for diabetes mellitus: Secondary | ICD-10-CM

## 2021-08-08 DIAGNOSIS — Z13220 Encounter for screening for lipoid disorders: Secondary | ICD-10-CM

## 2021-08-08 DIAGNOSIS — N182 Chronic kidney disease, stage 2 (mild): Secondary | ICD-10-CM

## 2021-08-08 DIAGNOSIS — Z130 Encounter for screening for diseases of the blood and blood-forming organs and certain disorders involving the immune mechanism: Secondary | ICD-10-CM

## 2021-08-08 DIAGNOSIS — Z0000 Encounter for general adult medical examination without abnormal findings: Secondary | ICD-10-CM | POA: Diagnosis not present

## 2021-08-08 DIAGNOSIS — Z136 Encounter for screening for cardiovascular disorders: Secondary | ICD-10-CM | POA: Diagnosis not present

## 2021-08-08 DIAGNOSIS — Z9989 Dependence on other enabling machines and devices: Secondary | ICD-10-CM

## 2021-08-08 NOTE — Patient Instructions (Signed)

## 2021-08-08 NOTE — Progress Notes (Signed)
Complete Physical  Assessment and Plan:  Encounter for routine adult health examination without abnormal findings 1 year CPE  Essential hypertension - continue medications, DASH diet, exercise and monitor at home. Call if greater than 130/80.  -     CBC with Differential/Platelet -     COMPLETE METABOLIC PANEL WITH GFR -     TSH -     Urinalysis, Routine w reflex microscopic -     Microalbumin / creatinine urine ratio -     EKG 12-Lead  Morbid obesity BMI (HCC)  - follow up 3 months for progress monitoring - increase veggies, decrease carbs - long discussion about weight loss, diet, and exercise  Medication management -     Magnesium  Vitamin D deficiency -     VITAMIN D 25 Hydroxy (Vit-D Deficiency, Fractures)  OSA on CPAP Continue CPAP  Hyperlipidemia associated with type 2 diabetes mellitus (HCC) -     Lipid panel The rouvastatin 40mg  qd  Type 2 diabetes mellitus with stage 2 chronic kidney disease, without long-term current use of insulin (HCC) -     Hemoglobin A1c Discussed general issues about diabetes pathophysiology and management., Educational material distributed., Suggested low cholesterol diet., Encouraged aerobic exercise., Discussed foot care., Reminded to get yearly retinal exam.  CKD stage 2 due to type 2 diabetes mellitus (HCC) -     COMPLETE METABOLIC PANEL WITH GFR -     Hemoglobin A1c Increase fluids, avoid NSAIDS, monitor sugars, will monitor   Screening PSA (prostate specific antigen) -     PSA  Screening for Ischemic disease EKG    Discussed med's effects and SE's. Screening labs and tests as requested with regular follow-up as recommended. Over 40 minutes of exam, counseling, chart review and critical decision making was performed  Future Appointments  Date Time Provider Department Center  08/08/2021 10:00 AM 08/10/2021, NP [facility] None  08/10/2022 10:00 AM 10/10/2022, NP [facility] None     HPI Patient presents for a  complete physical. Works in [name]. He has Essential hypertension; Hyperlipidemia; Vitamin D deficiency; Abnormal glucose; Medication management; Morbid obesity BMI (HCC) ; OSA on CPAP; Labile hypertension; CKD stage 2 due to type 2 diabetes mellitus (HCC); Type 2 diabetes mellitus with stage 2 chronic kidney disease, without long-term current use of insulin (HCC); and Hyperlipidemia associated with type 2 diabetes mellitus (HCC) on their problem list.   OSA followed by Dr. Metallurgist , continues to use CPAP. Is getting new parts for his CPAP machine   He also has DOT physical, as he is a [employer]. He states he has a physical job. He does not recall an injury at work but states he lifts a lot. For 2 weeks he has had left inguinal/thigh pain, feels like pulled muscle, no radiation, worse when he first wakes up in the morning. Better with ibuprofen or aleve and after walking. No pain down his leg.      BMI is Body mass index is 37.42 kg/m., he has been working on diet and exercise. Wt Readings from Last 3 Encounters:  08/08/21 260 lb 12.8 oz (118.3 kg)  12/10/20 260 lb (117.9 kg)  08/13/20 245 lb (111.1 kg)   His blood pressure has been controlled at home, today their BP is BP: 124/78 He does workout (physically intense job). tHe denies chest pain, shortness of breath, dizziness.   He is on cholesterol medication (switched to crestor 40 mg zetia 10 mg) and denies  myalgias. His cholesterol is not at goal. The cholesterol last visit was:   Lab Results  Component Value Date   CHOL 194 12/10/2020   HDL 27 (L) 12/10/2020   LDLCALC  12/10/2020     Comment:     . LDL cholesterol not calculated. Triglyceride levels greater than 400 mg/dL invalidate calculated LDL results. . Reference range: <100 . Desirable range <100 mg/dL for primary prevention;   <70 mg/dL for patients with CHD or diabetic patients  with > or = 2 CHD risk factors. Marland Kitchen LDL-C is now calculated using the  Martin-Hopkins  calculation, which is a validated novel method providing  better accuracy than the Friedewald equation in the  estimation of LDL-C.  [name] Lenox Ahr. 5462;703(50): 2061-2068  (http://education.[facility].com/faq/FAQ164)    TRIG 538 (H) 12/10/2020   CHOLHDL 7.2 (H) 12/10/2020    He has been working on diet and exercise for diabetes He states he has been very strict with diet, he is using stevia . Eating fresh fruits and vegetables.  Eating sugar free peanut butter. He is eating dave's killer bread if he does eat. He is increasing his veggies.    With CKD not on ACE- last kidney function at goal With hyperlipidemia not at goal of 70- on crestor 40 mg changed last tiime to reach goal- having HA's- may adjust  he is on bASA  he is not on ACE/ARB  He was started on glipizide 5 mg 2 x a day Metformin 500 mg 2 pills a day Blood sugars are running 90- 145 and denies foot ulcerations, increased appetite, nausea, paresthesia of the feet, polydipsia, polyuria, visual disturbances, vomiting and weight loss.  Last A1C in the office was:  Lab Results  Component Value Date   HGBA1C 6.0 (H) 12/10/2020   Last GFR: Lab Results  Component Value Date   GFRNONAA 80 12/10/2020    Patient is on Vitamin D supplement.   Lab Results  Component Value Date   VD25OH 68 04/23/2020     Last PSA was: Lab Results  Component Value Date   PSA 2.0 04/23/2020    Current Medications:   Current Outpatient Medications (Endocrine & Metabolic):    glipiZIDE (GLUCOTROL) 5 MG tablet, TAKE 1 TABLET BY MOUTH THREE TIMES DAILY WITH MEALS FOR DIABETES   metFORMIN (GLUCOPHAGE-XR) 500 MG 24 hr tablet, TAKE 1 TO 2 TABLETS BY MOUTH TWICE DAILY WITH MEALS FOR DIABETES  Current Outpatient Medications (Cardiovascular):    rosuvastatin (CRESTOR) 40 MG tablet, Take  1 tablet  Daily  for Cholesterol   ezetimibe (ZETIA) 10 MG tablet, Take 1 tablet (10 mg total) by mouth daily.   Current  Outpatient Medications (Analgesics):    BABY ASPIRIN PO, Take 81 mg by mouth daily.   meloxicam (MOBIC) 15 MG tablet, Take 1/2 to 1 tablet Daily  with Food as needed for Pain & Inflammation & try limit to 5 days /week to AVOid Permanent Kidney Damage / Patient knows to take by mouth  !  Current Outpatient Medications (Hematological):    Cyanocobalamin (VITAMIN B-12 PO), Take by mouth daily.  Current Outpatient Medications (Other):    Cholecalciferol (VITAMIN D PO), Take 5,000 Int'l Units by mouth in the morning and at bedtime.    Multiple Vitamin (MULTIVITAMIN) tablet, Take 1 tablet by mouth daily.   Omega-3 Fatty Acids (FISH OIL PO), Take 1,200 mg by mouth daily.   zinc gluconate 50 MG tablet, Take 50 mg by mouth daily.  Thiamine HCl (VITAMIN B-1) 250 MG tablet, Take 250 mg by mouth daily. (Patient not taking: Reported on 08/08/2021)  Allergies:  No Known Allergies   Health Maintenance:  Immunization History  Administered Date(s) Administered   Influenza Inj Mdck Quad With Preservative 10/10/2018   Influenza Split 10/08/2014   Influenza-Unspecified 08/07/2015   PFIZER(Purple Top)SARS-COV-2 Vaccination 04/07/2020   PPD Test 10/08/2014, 10/21/2015   Tdap 08/14/2009   Health Maintenance  Topic Date Due   FOOT EXAM  Never done   OPHTHALMOLOGY EXAM  Never done   Zoster Vaccines- Shingrix (1 of 2) Never done   TETANUS/TDAP  08/15/2019   COVID-19 Vaccine (2 - Pfizer risk series) 04/28/2020   URINE MICROALBUMIN  04/23/2021   INFLUENZA VACCINE  06/09/2021   HEMOGLOBIN A1C  06/09/2021   COLONOSCOPY (Pts 45-61yrs Insurance coverage will need to be confirmed)  11/25/2021   Hepatitis C Screening  Completed   HIV Screening  Completed   HPV VACCINES  Aged Out    Tetanus: 2010 DUE but getting COVID vaccines Pneumovax: N/A Prevnar 13: N/A Flu vaccine: 2020 Shingrix: declines- discussed   DEE 2019  DEXA: N/A Colonoscopy: 11/2011 10 year due 2023 EGD: N/A  [place] Exam: [place], last year Dentist: Last several years ago, has new insurance, will schedule appointment  Patient Care Team: Lucky Cowboy, MD as PCP - General (Internal Medicine) Sharrell Ku, MD as Consulting Physician (Gastroenterology)  Medical History:  has Essential hypertension; Hyperlipidemia; Vitamin D deficiency; Abnormal glucose; Medication management; Morbid obesity BMI (HCC) ; OSA on CPAP; Labile hypertension; CKD stage 2 due to type 2 diabetes mellitus (HCC); Type 2 diabetes mellitus with stage 2 chronic kidney disease, without long-term current use of insulin (HCC); and Hyperlipidemia associated with type 2 diabetes mellitus (HCC) on their problem list. Surgical History:  He  has a past surgical history that includes Hemorrhoid banding (2013) and Nasal septum surgery (2004). Family History:  His family history includes Emphysema in his father; Heart attack in his mother; Heart disease in his father; Hyperlipidemia in his mother. Social History:   reports that he quit smoking about 25 years ago. He has never used smokeless tobacco. He reports that he does not drink alcohol and does not use drugs. Review of Systems:  Review of Systems  Constitutional:  Negative for chills, fever, malaise/fatigue and weight loss.  HENT:  Negative for hearing loss and tinnitus.   Eyes:  Negative for blurred vision and double vision.  Respiratory:  Negative for cough, shortness of breath and wheezing.   Cardiovascular:  Negative for chest pain, palpitations, orthopnea, claudication and leg swelling.  Gastrointestinal:  Negative for abdominal pain, blood in stool, constipation, diarrhea, heartburn, melena, nausea and vomiting.  Genitourinary:  Negative for dysuria.  Musculoskeletal:  Negative for back pain, joint pain and myalgias.  Skin:  Negative for rash.  Neurological:  Negative for dizziness, tingling, sensory change, weakness and headaches.  Endo/Heme/Allergies:  Negative for polydipsia.   Psychiatric/Behavioral: Negative.  Negative for depression. The patient does not have insomnia.   All other systems reviewed and are negative.  Physical Exam: Estimated body mass index is 37.42 kg/m as calculated from the following:   Height as of this encounter: 5\' 10"  (1.778 m).   Weight as of this encounter: 260 lb 12.8 oz (118.3 kg). BP 124/78   Pulse 70   Temp 97.7 F (36.5 C)   Ht 5\' 10"  (1.778 m)   Wt 260 lb 12.8 oz (118.3 kg)  SpO2 99%   BMI 37.42 kg/m  General Appearance: Well nourished, in no apparent distress.  Eyes: PERRLA, EOMs, conjunctiva no swelling or erythema, normal fundi and vessels.  Sinuses: No Frontal/maxillary tenderness  ENT/Mouth: Ext aud canals clear, normal light reflex with TMs without erythema, bulging. Good dentition. No erythema, swelling, or exudate on post pharynx. Tonsils not swollen or erythematous. Hearing normal.  Neck: Supple, thyroid normal. No bruits  Respiratory: Respiratory effort normal, BS equal bilaterally without rales, rhonchi, wheezing or stridor.  Cardio: RRR without murmurs, rubs or gallops. Brisk peripheral pulses without edema.  Chest: symmetric, with normal excursions and percussion.  Abdomen: Soft, nontender, no guarding, rebound, hernias, masses, or organomegaly.  Lymphatics: Non tender without lymphadenopathy.  Genitourinary:  Musculoskeletal: Full ROM all peripheral extremities,5/5 strength, and normal gait.  Skin: Warm, dry. 1cm hemangioma of back Neuro: Cranial nerves intact, reflexes equal bilaterally. Normal muscle tone, no cerebellar symptoms. Sensation intact.  Psych: Awake and oriented X 3, normal affect, Insight and Judgment appropriate.   EKG: WNL no  ST changes.   Bassam Dresch W Chantille Navarrete 9:51 AM [facility]

## 2021-08-09 LAB — COMPLETE METABOLIC PANEL WITH GFR
AG Ratio: 1.7 (calc) (ref 1.0–2.5)
ALT: 26 U/L (ref 9–46)
AST: 25 U/L (ref 10–35)
Albumin: 4.7 g/dL (ref 3.6–5.1)
Alkaline phosphatase (APISO): 55 U/L (ref 35–144)
BUN: 16 mg/dL (ref 7–25)
CO2: 26 mmol/L (ref 20–32)
Calcium: 10 mg/dL (ref 8.6–10.3)
Chloride: 105 mmol/L (ref 98–110)
Creat: 1.07 mg/dL (ref 0.70–1.35)
Globulin: 2.7 g/dL (calc) (ref 1.9–3.7)
Glucose, Bld: 108 mg/dL — ABNORMAL HIGH (ref 65–99)
Potassium: 4.6 mmol/L (ref 3.5–5.3)
Sodium: 141 mmol/L (ref 135–146)
Total Bilirubin: 1.4 mg/dL — ABNORMAL HIGH (ref 0.2–1.2)
Total Protein: 7.4 g/dL (ref 6.1–8.1)
eGFR: 79 mL/min/{1.73_m2} (ref >=60)

## 2021-08-09 LAB — CBC WITH DIFFERENTIAL/PLATELET
Absolute Monocytes: 588 cells/uL (ref 200–950)
Basophils Absolute: 42 cells/uL (ref 0–200)
Basophils Relative: 0.7 %
Eosinophils Absolute: 102 cells/uL (ref 15–500)
Eosinophils Relative: 1.7 %
HCT: 43.9 % (ref 38.5–50.0)
Hemoglobin: 14.9 g/dL (ref 13.2–17.1)
Lymphs Abs: 2184 cells/uL (ref 850–3900)
MCH: 28 pg (ref 27.0–33.0)
MCHC: 33.9 g/dL (ref 32.0–36.0)
MCV: 82.5 fL (ref 80.0–100.0)
MPV: 11.7 fL (ref 7.5–12.5)
Monocytes Relative: 9.8 %
Neutro Abs: 3084 cells/uL (ref 1500–7800)
Neutrophils Relative %: 51.4 %
Platelets: 236 10*3/uL (ref 140–400)
RBC: 5.32 10*6/uL (ref 4.20–5.80)
RDW: 13.6 % (ref 11.0–15.0)
Total Lymphocyte: 36.4 %
WBC: 6 10*3/uL (ref 3.8–10.8)

## 2021-08-09 LAB — HEMOGLOBIN A1C
Hgb A1c MFr Bld: 5.9 % of total Hgb — ABNORMAL HIGH (ref <5.7)
Mean Plasma Glucose: 123 mg/dL
eAG (mmol/L): 6.8 mmol/L

## 2021-08-09 LAB — URINALYSIS, ROUTINE W REFLEX MICROSCOPIC
Bacteria, UA: NONE SEEN /HPF
Bilirubin Urine: NEGATIVE
Glucose, UA: NEGATIVE
Hgb urine dipstick: NEGATIVE
Hyaline Cast: NONE SEEN /LPF
Ketones, ur: NEGATIVE
Nitrite: NEGATIVE
Specific Gravity, Urine: 1.022 (ref 1.001–1.035)
Squamous Epithelial / HPF: NONE SEEN /HPF (ref <=5)
pH: 7 (ref 5.0–8.0)

## 2021-08-09 LAB — MICROALBUMIN / CREATININE URINE RATIO
Creatinine, Urine: 180 mg/dL (ref 20–320)
Microalb Creat Ratio: 13 mcg/mg creat (ref <30)
Microalb, Ur: 2.3 mg/dL

## 2021-08-09 LAB — VITAMIN D 25 HYDROXY (VIT D DEFICIENCY, FRACTURES): Vit D, 25-Hydroxy: 65 ng/mL (ref 30–100)

## 2021-08-09 LAB — LIPID PANEL
Cholesterol: 194 mg/dL (ref <200)
HDL: 27 mg/dL — ABNORMAL LOW (ref >=40)
LDL Cholesterol (Calc): 128 mg/dL (calc) — ABNORMAL HIGH
Non-HDL Cholesterol (Calc): 167 mg/dL (calc) — ABNORMAL HIGH (ref <130)
Total CHOL/HDL Ratio: 7.2 (calc) — ABNORMAL HIGH (ref <5.0)
Triglycerides: 242 mg/dL — ABNORMAL HIGH (ref <150)

## 2021-08-09 LAB — TSH: TSH: 1.9 mIU/L (ref 0.40–4.50)

## 2021-08-09 LAB — MICROSCOPIC MESSAGE

## 2021-08-09 LAB — PSA: PSA: 2.68 ng/mL (ref <=4.00)

## 2021-08-09 LAB — MAGNESIUM: Magnesium: 2.1 mg/dL (ref 1.5–2.5)

## 2021-10-13 ENCOUNTER — Other Ambulatory Visit: Payer: Self-pay | Admitting: Internal Medicine

## 2021-10-13 DIAGNOSIS — E1165 Type 2 diabetes mellitus with hyperglycemia: Secondary | ICD-10-CM

## 2021-10-13 MED ORDER — MELOXICAM 15 MG PO TABS: ORAL_TABLET | ORAL | 3 refills | Status: DC

## 2021-11-21 ENCOUNTER — Other Ambulatory Visit: Payer: Self-pay | Admitting: Nurse Practitioner

## 2021-11-21 DIAGNOSIS — E1129 Type 2 diabetes mellitus with other diabetic kidney complication: Secondary | ICD-10-CM

## 2021-12-09 NOTE — Progress Notes (Deleted)
FOLLOW UP 4  MONTH  Assessment and Plan:  Larry Hudson was seen today for follow-up.  Diagnoses and all orders for this visit:  Essential hypertension No medications, lifestyle controlled Monitor blood pressure at home; call if consistently over 130/80 Continue DASH diet.   Reminder to go to the ER if any CP, SOB, nausea, dizziness, severe HA, changes vision/speech, left arm numbness and tingling and jaw pain. -     CBC with Differential/Platelet -     COMPLETE METABOLIC PANEL WITH GFR  Hyperlipidemia associated with type 2 diabetes mellitus (HCC) Continue medications: rosuvastatin 40mg  daily Discussed dietary and exercise modifications Low fat diet -     Lipid panel  CKD stage 2 due to type 2 diabetes mellitus (HCC) Increase fluids  Avoid NSAIDS Blood pressure control Monitor sugars  Will continue to monitor  Type 2 diabetes mellitus with stage 2 chronic kidney disease, without long-term current use of insulin (HCC) Continue medications: Metformin 500mg  two tablets twice a day with food. Glipizide 5mg  taking this BID. Discussed general issues about diabetes pathophysiology and management. Education: Reviewed ABCs of diabetes management (respective goals in parentheses):  A1C (<7), blood pressure (<130/80), and cholesterol (LDL <70) Dietary recommendations Encouraged aerobic exercise.  Discussed foot care, check daily Yearly retinal exam Dental exam every 6 months Monitor blood glucose, discussed goal for patient -     Hemoglobin A1c  OSA on CPAP Continue CPAP/BiPAP, using nightly for at least 8 hours  Helping with daytime fatigue Weight loss still advised Discussed mask & tubing hygeine  Vitamin D deficiency Continue supplementation to maintain goal of 70-100 Taking Vitamin D 5,000 IU daily Defer vitamin D level   Morbid obesity BMI (HCC)  Discussed dietary and exercise modifications  Medication management Continued   Discussed med's effects and SE's. Screening  labs and tests as requested with regular follow-up as recommended. Over 30 minutes of face to face interview, exam, counseling, chart review and critical decision making was performed  Future Appointments  Date Time Provider [place]  12/12/2021  9:30 AM Magda Bernheim, NP [facility] None  08/10/2022 10:00 AM Magda Bernheim, NP [facility] None     HPI Patient presents for a 3 month follow up.  He has Essential hypertension; Hyperlipidemia; Vitamin D deficiency; Abnormal glucose; Medication management; Morbid obesity BMI (HCC) ; OSA on CPAP; Labile hypertension; CKD stage 2 due to type 2 diabetes mellitus ([place]); Type 2 diabetes mellitus with stage 2 chronic kidney disease, without long-term current use of insulin ([place]); and Hyperlipidemia associated with type 2 diabetes mellitus ([place]) on their problem list.      He has DMII and he checks his blood glucose daily and had good control and checking once very two days.  Reports that his average is 90.  Last A1c was 5.9 on 08/08/21.    OSA followed by Dr. Brett Fairy and had follow up to check his machine.  He reports that he needs a new mask.  He is working with their office for this.   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   Previous left hip pain.  Today he reports his hips are doing well and he is no longer taking Meloxicam for this.     BMI is There is no height or weight on file to calculate BMI., he has been working on diet and exercise. Wt Readings from Last 3 Encounters:  08/08/21 260 lb 12.8 oz (118.3 kg)  12/10/20 260 lb (117.9  kg)  08/13/20 245 lb (111.1 kg)   His blood pressure has been controlled at home, today their BP is   He does workout (physically intense job).  He denies chest pain, shortness of breath, dizziness.   He is on cholesterol medication (Taking rosuvastatin 40 mg zetia 10 mg) and denies myalgias. His cholesterol is not at goal. The cholesterol last visit was:   Lab Results  Component Value  Date   CHOL 194 08/08/2021   HDL 27 (L) 08/08/2021   LDLCALC 128 (H) 08/08/2021   TRIG 242 (H) 08/08/2021   CHOLHDL 7.2 (H) 08/08/2021   He has been working on diet and exercise for diabetes He states he has been very strict with diet, he is using stevia rather than sugar, he is off cookies/icecream. He will occ eat sugar free ice cream. He is eating dave's killer bread if he does eat. He is increasing his veggies.    With CKD not on ACE- last kidney function at goal With hyperlipidemia not at goal of 70- on crestor 40 mg changed last tiime to reach goal- having HA's- may adjust  he is on bASA  he is not on ACE/ARB  He was started on glipizide 5 mg 2 x a day Metformin 500 mg 2 pills a day and denies foot ulcerations, increased appetite, nausea, paresthesia of the feet, polydipsia, polyuria, visual disturbances, vomiting and weight loss.  Last A1C in the office was:  Lab Results  Component Value Date   HGBA1C 5.9 (H) 08/08/2021   Last GFR: Lab Results  Component Value Date   GFRNONAA 80 12/10/2020    Patient is on Vitamin D supplement.   Lab Results  Component Value Date   VD25OH 28 08/08/2021     Last PSA was: Lab Results  Component Value Date   PSA 2.68 08/08/2021    Current Medications:   Current Outpatient Medications (Endocrine & Metabolic):    glipiZIDE (GLUCOTROL) 5 MG tablet, TAKE 1 TABLET BY MOUTH THREE TIMES DAILY WITH MEALS FOR DIABETES   metFORMIN (GLUCOPHAGE-XR) 500 MG 24 hr tablet, TAKE 1 TO 2 TABLETS BY MOUTH TWICE DAILY WITH MEALS FOR DIABETES  Current Outpatient Medications (Cardiovascular):    ezetimibe (ZETIA) 10 MG tablet, Take 1 tablet (10 mg total) by mouth daily.   rosuvastatin (CRESTOR) 40 MG tablet, Take  1 tablet  Daily  for Cholesterol   Current Outpatient Medications (Analgesics):    BABY ASPIRIN PO, Take 81 mg by mouth daily.   meloxicam (MOBIC) 15 MG tablet, Take  1/2 to 1 tablet  Daily  with Food as needed for Pain & Inflammation & try  limit to 5 days /week to Avoid Permanent Kidney Damage                                                      /                             Patient knows to take by mouth  !  Current Outpatient Medications (Hematological):    Cyanocobalamin (VITAMIN B-12 PO), Take by mouth daily.  Current Outpatient Medications (Other):    Cholecalciferol (VITAMIN D PO), Take 5,000 Int'l Units by mouth in the morning and at bedtime.    Multiple Vitamin (  MULTIVITAMIN) tablet, Take 1 tablet by mouth daily.   Omega-3 Fatty Acids (FISH OIL PO), Take 1,200 mg by mouth daily.   zinc gluconate 50 MG tablet, Take 50 mg by mouth daily.  Allergies:  No Known Allergies   Health Maintenance:  Immunization History  Administered Date(s) Administered   Influenza Inj Mdck Quad With Preservative 10/10/2018   Influenza Split 10/08/2014   Influenza-Unspecified 08/07/2015   PFIZER(Purple Top)SARS-COV-2 Vaccination 04/07/2020   PPD Test 10/08/2014, 10/21/2015   Tdap 08/14/2009   Health Maintenance  Topic Date Due   FOOT EXAM  Never done   OPHTHALMOLOGY EXAM  Never done   Zoster Vaccines- Shingrix (1 of 2) Never done   TETANUS/TDAP  08/15/2019   COVID-19 Vaccine (2 - Pfizer risk series) 04/28/2020   INFLUENZA VACCINE  06/09/2021   COLONOSCOPY (Pts 45-98yrs Insurance coverage will need to be confirmed)  11/25/2021   HEMOGLOBIN A1C  02/05/2022   URINE MICROALBUMIN  08/08/2022   Hepatitis C Screening  Completed   HIV Screening  Completed   HPV VACCINES  Aged Out    Tetanus: 2010 DUE discussed with patient, get at physical Pneumovax: DUE- delayed due to COVID vaccines, discussed with patient Prevnar 13: N/A Flu vaccine: 2020 Shingrix: declines- discussed   DEE 2019  DEXA: N/A Colonoscopy: 11/2011 10 year EGD: N/A  [place]am: [place], last 2-3 years ago, but has scheduled for this year Dentist: Last several years ago, has new insurance, will schedule appointment ASAP  Patient Care Team: Lucky Cowboy, MD as PCP - General (Internal Medicine) Sharrell Ku, MD as Consulting Physician (Gastroenterology)  Medical History:  has Essential hypertension; Hyperlipidemia; Vitamin D deficiency; Abnormal glucose; Medication management; Morbid obesity BMI (HCC) ; OSA on CPAP; Labile hypertension; CKD stage 2 due to type 2 diabetes mellitus (HCC); Type 2 diabetes mellitus with stage 2 chronic kidney disease, without long-term current use of insulin (HCC); and Hyperlipidemia associated with type 2 diabetes mellitus (HCC) on their problem list. Surgical History:  He  has a past surgical history that includes Hemorrhoid banding (2013) and Nasal septum surgery (2004). Family History:  His family history includes Emphysema in his father; Heart attack in his mother; Heart disease in his father; Hyperlipidemia in his mother. Social History:   reports that he quit smoking about 25 years ago. He has never used smokeless tobacco. He reports that he does not drink alcohol and does not use drugs. Review of Systems:  Review of Systems  Constitutional:  Negative for malaise/fatigue and weight loss.  HENT:  Negative for hearing loss and tinnitus.   Eyes:  Negative for blurred vision and double vision.  Respiratory:  Negative for cough, shortness of breath and wheezing.   Cardiovascular:  Negative for chest pain, palpitations, orthopnea, claudication and leg swelling.  Gastrointestinal:  Negative for abdominal pain, blood in stool, constipation, diarrhea, heartburn, melena, nausea and vomiting.  Genitourinary: Negative.   Musculoskeletal:  Negative for joint pain and myalgias.  Skin:  Negative for rash.  Neurological:  Negative for dizziness, tingling, sensory change, weakness and headaches.  Endo/Heme/Allergies:  Negative for polydipsia.  Psychiatric/Behavioral: Negative.    All other systems reviewed and are negative.  Physical Exam: Estimated body mass index is 37.42 kg/m as calculated from the  following:   Height as of 08/08/21: 5\' 10"  (1.778 m).   Weight as of 08/08/21: 260 lb 12.8 oz (118.3 kg). There were no vitals taken for this visit. General Appearance: Well nourished, in no apparent  distress.  Eyes: PERRLA, EOMs, conjunctiva no swelling or erythema, normal fundi and vessels.  Sinuses: No Frontal/maxillary tenderness  ENT/Mouth: Ext aud canals clear, normal light reflex with TMs without erythema, bulging. Erythema to  Upper gum starting at tooth number #5 extending to tooth #3. No erythema, swelling, or exudate on post pharynx. Tonsils not swollen or erythematous. Hearing normal.  Neck: Supple, thyroid normal. No bruits  Respiratory: Respiratory effort normal, BS equal bilaterally without rales, rhonchi, wheezing or stridor.  Cardio: RRR without murmurs, rubs or gallops. Brisk peripheral pulses without edema.  Chest: symmetric, with normal excursions and percussion.  Abdomen: Soft, nontender, no guarding, rebound, hernias, masses, or organomegaly.  Lymphatics: Non tender without lymphadenopathy.  Genitourinary:  Musculoskeletal: Full ROM all peripheral extremities,5/5 strength, and normal gait.  Skin: Warm, dry without rashes, lesions, ecchymosis. Neuro: Cranial nerves intact, reflexes equal bilaterally. Normal muscle tone, no cerebellar symptoms. Sensation intact.  Psych: Awake and oriented X 3, normal affect, Insight and Judgment appropriate.     Garnet Sierras, Laqueta Jean, DNP [facility] 12/09/2021  12:50 PM

## 2021-12-12 ENCOUNTER — Ambulatory Visit: Payer: 59 | Admitting: Nurse Practitioner

## 2021-12-12 DIAGNOSIS — G4733 Obstructive sleep apnea (adult) (pediatric): Secondary | ICD-10-CM

## 2021-12-12 DIAGNOSIS — Z79899 Other long term (current) drug therapy: Secondary | ICD-10-CM

## 2021-12-12 DIAGNOSIS — E1122 Type 2 diabetes mellitus with diabetic chronic kidney disease: Secondary | ICD-10-CM

## 2021-12-12 DIAGNOSIS — E559 Vitamin D deficiency, unspecified: Secondary | ICD-10-CM

## 2021-12-12 DIAGNOSIS — I10 Essential (primary) hypertension: Secondary | ICD-10-CM

## 2021-12-12 DIAGNOSIS — E1169 Type 2 diabetes mellitus with other specified complication: Secondary | ICD-10-CM

## 2021-12-31 NOTE — Progress Notes (Signed)
FOLLOW UP 4  MONTH  Assessment and Plan:  Kazumi was seen today for follow-up.  Diagnoses and all orders for this visit:  Essential hypertension No medications, lifestyle controlled Monitor blood pressure at home; call if consistently over 130/80 Continue DASH diet.   Reminder to go to the ER if any CP, SOB, nausea, dizziness, severe HA, changes vision/speech, left arm numbness and tingling and jaw pain. -     CBC with Differential/Platelet -     COMPLETE METABOLIC PANEL WITH GFR  Hyperlipidemia associated with type 2 diabetes mellitus (HCC) Continue medications: rosuvastatin 40mg  daily Discussed dietary and exercise modifications Low fat diet -     Lipid panel  CKD stage 2 due to type 2 diabetes mellitus (HCC) Increase fluids  Avoid NSAIDS Blood pressure control Monitor sugars  Will continue to monitor  Type 2 diabetes mellitus with stage 2 chronic kidney disease, without long-term current use of insulin (HCC) Continue medications: Metformin 500mg  two tablets twice a day with food. Glipizide 5mg  taking this BID. Discussed general issues about diabetes pathophysiology and management. Education: Reviewed ABCs of diabetes management (respective goals in parentheses):  A1C (<7), blood pressure (<130/80), and cholesterol (LDL <70) Dietary recommendations Encouraged aerobic exercise.  Discussed foot care, check daily Yearly retinal exam Dental exam every 6 months Monitor blood glucose, discussed goal for patient -     Hemoglobin A1c  OSA on CPAP Continue CPAP/BiPAP, using nightly for at least 8 hours  Helping with daytime fatigue Weight loss still advised Discussed mask & tubing hygeine  Vitamin D deficiency Continue supplementation to maintain goal of 70-100 Taking Vitamin D 5,000 IU daily Defer vitamin D level   Morbid obesity BMI (HCC)  Discussed dietary and exercise modifications  Medication management Continued   Discussed med's effects and SE's. Screening  labs and tests as requested with regular follow-up as recommended. Over 30 minutes of face to face interview, exam, counseling, chart review and critical decision making was performed  Future Appointments  Date Time Provider [place]  01/02/2022 10:30 AM Magda Bernheim, NP [facility] None  08/10/2022 10:00 AM Magda Bernheim, NP [facility] None     HPI Patient presents for a 3 month follow up.  He has Essential hypertension; Hyperlipidemia; Vitamin D deficiency; Abnormal glucose; Medication management; Morbid obesity BMI (HCC) ; OSA on CPAP; Labile hypertension; CKD stage 2 due to type 2 diabetes mellitus ([place]); Type 2 diabetes mellitus with stage 2 chronic kidney disease, without long-term current use of insulin ([place]); and Hyperlipidemia associated with type 2 diabetes mellitus ([place]) on their problem list.     OSA followed by Dr. Brett Fairy and had follow up to check his machine.  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   Previous left hip pain.  Today he reports his hips are doing well and he will only take this medication as needed.    BMI is Body mass index is 38.17 kg/m., he has been working on diet and exercise. Wt Readings from Last 3 Encounters:  01/02/22 266 lb (120.7 kg)  08/08/21 260 lb 12.8 oz (118.3 kg)  12/10/20 260 lb (117.9 kg)   His blood pressure has been controlled at home, today their BP is BP: 130/80  BP Readings from Last 3 Encounters:  01/02/22 130/80  08/08/21 124/78  12/10/20 136/78    He does workout (physically intense job).  He denies chest pain, shortness of breath, dizziness.   He is on cholesterol medication (  Taking rosuvastatin 40 mg zetia 10 mg) and denies myalgias. His cholesterol is not at goal. The cholesterol last visit was:   Lab Results  Component Value Date   CHOL 194 08/08/2021   HDL 27 (L) 08/08/2021   LDLCALC 128 (H) 08/08/2021   TRIG 242 (H) 08/08/2021   CHOLHDL 7.2 (H) 08/08/2021   He has been working on diet and  exercise for diabetes He states he has been very strict with diet, he is using stevia rather than sugar, he is off cookies/icecream. He will occ eat sugar free ice cream. He is eating dave's killer bread if he does eat. He is increasing his veggies.    With CKD not on ACE- last kidney function at goal With hyperlipidemia not at goal of 70- on crestor 40 mg changed last tiime to reach goal- having HA's- may adjust  he is on bASA  he is not on ACE/ARB  He was started on glipizide 5 mg 3 x a day Metformin 500 mg 2 pills a day He is getting blood sugars running 100-120 and denies foot ulcerations, increased appetite, nausea, paresthesia of the feet, polydipsia, polyuria, visual disturbances, vomiting and weight loss.  Last A1C in the office was:  Lab Results  Component Value Date   HGBA1C 5.9 (H) 08/08/2021   Last GFR: Lab Results  Component Value Date   GFRNONAA 80 12/10/2020    Patient is on Vitamin D supplement.   Lab Results  Component Value Date   VD25OH 38 08/08/2021     Last PSA was: Lab Results  Component Value Date   PSA 2.68 08/08/2021    Current Medications:   Current Outpatient Medications (Endocrine & Metabolic):    glipiZIDE (GLUCOTROL) 5 MG tablet, TAKE 1 TABLET BY MOUTH THREE TIMES DAILY WITH MEALS FOR DIABETES   metFORMIN (GLUCOPHAGE-XR) 500 MG 24 hr tablet, TAKE 1 TO 2 TABLETS BY MOUTH TWICE DAILY WITH MEALS FOR DIABETES  Current Outpatient Medications (Cardiovascular):    ezetimibe (ZETIA) 10 MG tablet, Take 1 tablet (10 mg total) by mouth daily.   rosuvastatin (CRESTOR) 40 MG tablet, Take  1 tablet  Daily  for Cholesterol   Current Outpatient Medications (Analgesics):    BABY ASPIRIN PO, Take 81 mg by mouth daily.   meloxicam (MOBIC) 15 MG tablet, Take  1/2 to 1 tablet  Daily  with Food as needed for Pain & Inflammation & try limit to 5 days /week to Avoid Permanent Kidney Damage                                                      /                              Patient knows to take by mouth  !  Current Outpatient Medications (Hematological):    Cyanocobalamin (VITAMIN B-12 PO), Take by mouth daily.  Current Outpatient Medications (Other):    Cholecalciferol (VITAMIN D PO), Take 5,000 Int'l Units by mouth in the morning and at bedtime.    Multiple Vitamin (MULTIVITAMIN) tablet, Take 1 tablet by mouth daily.   Omega-3 Fatty Acids (FISH OIL PO), Take 1,200 mg by mouth daily.   zinc gluconate 50 MG tablet, Take 50 mg by mouth daily.  Allergies:  No Known Allergies   Health Maintenance:  Immunization History  Administered Date(s) Administered   Influenza Inj Mdck Quad With Preservative 10/10/2018   Influenza Split 10/08/2014   Influenza-Unspecified 08/07/2015   PFIZER(Purple Top)SARS-COV-2 Vaccination 04/07/2020   PPD Test 10/08/2014, 10/21/2015   Tdap 08/14/2009   Health Maintenance  Topic Date Due   FOOT EXAM  Never done   OPHTHALMOLOGY EXAM  Never done   Zoster Vaccines- Shingrix (1 of 2) Never done   TETANUS/TDAP  08/15/2019   COVID-19 Vaccine (2 - Pfizer risk series) 04/28/2020   INFLUENZA VACCINE  06/09/2021   COLONOSCOPY (Pts 45-53yrs Insurance coverage will need to be confirmed)  11/25/2021   HEMOGLOBIN A1C  02/05/2022   URINE MICROALBUMIN  08/08/2022   Hepatitis C Screening  Completed   HIV Screening  Completed   HPV VACCINES  Aged Out    Tetanus: 2010 DUE discussed with patient, get at physical Pneumovax: DUE- delayed due to COVID vaccines, discussed with patient Prevnar 13: N/A Flu vaccine: 2020 Shingrix: declines- discussed   DEE 2019  DEXA: N/A Colonoscopy: 11/2011 10 year EGD: N/A  [place] Exam: [place], last 2-3 years ago, but has scheduled for this year Dentist: Last several years ago, has new insurance, will schedule appointment ASAP  Patient Care Team: Unk Pinto, MD as PCP - General (Internal Medicine) Richmond Campbell, MD as Consulting Physician (Gastroenterology)  Medical History:   has Essential hypertension; Hyperlipidemia; Vitamin D deficiency; Abnormal glucose; Medication management; Morbid obesity BMI (HCC) ; OSA on CPAP; Labile hypertension; CKD stage 2 due to type 2 diabetes mellitus ([place]); Type 2 diabetes mellitus with stage 2 chronic kidney disease, without long-term current use of insulin ([place]); and Hyperlipidemia associated with type 2 diabetes mellitus ([place]) on their problem list. Surgical History:  He  has a past surgical history that includes Hemorrhoid banding (2013) and Nasal septum surgery (2004). Family History:  His family history includes Emphysema in his father; Heart attack in his mother; Heart disease in his father; Hyperlipidemia in his mother. Social History:   reports that he quit smoking about 25 years ago. He has never used smokeless tobacco. He reports that he does not drink alcohol and does not use drugs. Review of Systems:  Review of Systems  Constitutional:  Negative for malaise/fatigue and weight loss.  HENT:  Negative for hearing loss and tinnitus.   Eyes:  Negative for blurred vision and double vision.  Respiratory:  Negative for cough, shortness of breath and wheezing.   Cardiovascular:  Negative for chest pain, palpitations, orthopnea, claudication and leg swelling.  Gastrointestinal:  Negative for abdominal pain, blood in stool, constipation, diarrhea, heartburn, melena, nausea and vomiting.  Genitourinary: Negative.   Musculoskeletal:  Negative for joint pain and myalgias.  Skin:  Negative for rash.  Neurological:  Negative for dizziness, tingling, sensory change, weakness and headaches.  Endo/Heme/Allergies:  Negative for polydipsia.  Psychiatric/Behavioral: Negative.    All other systems reviewed and are negative.  Physical Exam: Estimated body mass index is 38.17 kg/m as calculated from the following:   Height as of 08/08/21: 5\' 10"  (1.778 m).   Weight as of this encounter: 266 lb (120.7 kg). BP 130/80    Pulse 67    Temp  (!) 97.5 F (36.4 C)    Wt 266 lb (120.7 kg)    SpO2 99%    BMI 38.17 kg/m  General Appearance: Well nourished, in no apparent distress.  Eyes: PERRLA, EOMs, conjunctiva no swelling or erythema, normal  fundi and vessels.  Sinuses: No Frontal/maxillary tenderness  ENT/Mouth: Ext aud canals clear, normal light reflex with TMs without erythema, bulging. Marland Kitchen No erythema, swelling, or exudate on post pharynx. Tonsils not swollen or erythematous. Hearing normal.  Neck: Supple, thyroid normal. No bruits  Respiratory: Respiratory effort normal, BS equal bilaterally without rales, rhonchi, wheezing or stridor.  Cardio: RRR without murmurs, rubs or gallops. Brisk peripheral pulses without edema.  Chest: symmetric, with normal excursions and percussion.  Abdomen: Soft, nontender, no guarding, rebound, hernias, masses, or organomegaly.  Lymphatics: Non tender without lymphadenopathy.  Genitourinary:  Musculoskeletal: Full ROM all peripheral extremities,5/5 strength, and normal gait.  Skin: Warm, dry without rashes, lesions, ecchymosis. Neuro: Cranial nerves intact, reflexes equal bilaterally. Normal muscle tone, no cerebellar symptoms. Sensation intact.  Psych: Awake and oriented X 3, normal affect, Insight and Judgment appropriate.     Magda Bernheim ANP-C  [facility]  01/02/2022

## 2022-01-02 ENCOUNTER — Ambulatory Visit (INDEPENDENT_AMBULATORY_CARE_PROVIDER_SITE_OTHER): Payer: 59 | Admitting: Nurse Practitioner

## 2022-01-02 ENCOUNTER — Encounter: Payer: Self-pay | Admitting: Nurse Practitioner

## 2022-01-02 ENCOUNTER — Other Ambulatory Visit: Payer: Self-pay

## 2022-01-02 VITALS — BP 130/80 | HR 67 | Temp 97.5°F | Wt 266.0 lb

## 2022-01-02 DIAGNOSIS — E559 Vitamin D deficiency, unspecified: Secondary | ICD-10-CM

## 2022-01-02 DIAGNOSIS — I10 Essential (primary) hypertension: Secondary | ICD-10-CM | POA: Diagnosis not present

## 2022-01-02 DIAGNOSIS — E785 Hyperlipidemia, unspecified: Secondary | ICD-10-CM

## 2022-01-02 DIAGNOSIS — G4733 Obstructive sleep apnea (adult) (pediatric): Secondary | ICD-10-CM

## 2022-01-02 DIAGNOSIS — N182 Chronic kidney disease, stage 2 (mild): Secondary | ICD-10-CM

## 2022-01-02 DIAGNOSIS — Z79899 Other long term (current) drug therapy: Secondary | ICD-10-CM

## 2022-01-02 DIAGNOSIS — E1122 Type 2 diabetes mellitus with diabetic chronic kidney disease: Secondary | ICD-10-CM

## 2022-01-02 DIAGNOSIS — E1169 Type 2 diabetes mellitus with other specified complication: Secondary | ICD-10-CM

## 2022-01-02 DIAGNOSIS — Z9989 Dependence on other enabling machines and devices: Secondary | ICD-10-CM

## 2022-01-03 LAB — COMPLETE METABOLIC PANEL WITH GFR
AG Ratio: 2 (calc) (ref 1.0–2.5)
ALT: 23 U/L (ref 9–46)
AST: 23 U/L (ref 10–35)
Albumin: 4.5 g/dL (ref 3.6–5.1)
Alkaline phosphatase (APISO): 50 U/L (ref 35–144)
BUN: 16 mg/dL (ref 7–25)
CO2: 27 mmol/L (ref 20–32)
Calcium: 9.7 mg/dL (ref 8.6–10.3)
Chloride: 106 mmol/L (ref 98–110)
Creat: 0.92 mg/dL (ref 0.70–1.35)
Globulin: 2.2 g/dL (calc) (ref 1.9–3.7)
Glucose, Bld: 106 mg/dL — ABNORMAL HIGH (ref 65–99)
Potassium: 4.5 mmol/L (ref 3.5–5.3)
Sodium: 140 mmol/L (ref 135–146)
Total Bilirubin: 1.2 mg/dL (ref 0.2–1.2)
Total Protein: 6.7 g/dL (ref 6.1–8.1)
eGFR: 95 mL/min/{1.73_m2} (ref >=60)

## 2022-01-03 LAB — LIPID PANEL
Cholesterol: 173 mg/dL (ref <200)
HDL: 27 mg/dL — ABNORMAL LOW (ref >=40)
LDL Cholesterol (Calc): 117 mg/dL (calc) — ABNORMAL HIGH
Non-HDL Cholesterol (Calc): 146 mg/dL (calc) — ABNORMAL HIGH (ref <130)
Total CHOL/HDL Ratio: 6.4 (calc) — ABNORMAL HIGH (ref <5.0)
Triglycerides: 176 mg/dL — ABNORMAL HIGH (ref <150)

## 2022-01-03 LAB — CBC WITH DIFFERENTIAL/PLATELET
Absolute Monocytes: 490 cells/uL (ref 200–950)
Basophils Absolute: 41 cells/uL (ref 0–200)
Basophils Relative: 0.8 %
Eosinophils Absolute: 117 cells/uL (ref 15–500)
Eosinophils Relative: 2.3 %
HCT: 42.8 % (ref 38.5–50.0)
Hemoglobin: 13.9 g/dL (ref 13.2–17.1)
Lymphs Abs: 1974 cells/uL (ref 850–3900)
MCH: 27.3 pg (ref 27.0–33.0)
MCHC: 32.5 g/dL (ref 32.0–36.0)
MCV: 83.9 fL (ref 80.0–100.0)
MPV: 11.7 fL (ref 7.5–12.5)
Monocytes Relative: 9.6 %
Neutro Abs: 2479 cells/uL (ref 1500–7800)
Neutrophils Relative %: 48.6 %
Platelets: 217 10*3/uL (ref 140–400)
RBC: 5.1 10*6/uL (ref 4.20–5.80)
RDW: 13.3 % (ref 11.0–15.0)
Total Lymphocyte: 38.7 %
WBC: 5.1 10*3/uL (ref 3.8–10.8)

## 2022-01-03 LAB — HEMOGLOBIN A1C
Hgb A1c MFr Bld: 6 % of total Hgb — ABNORMAL HIGH (ref <5.7)
Mean Plasma Glucose: 126 mg/dL
eAG (mmol/L): 7 mmol/L

## 2022-02-23 ENCOUNTER — Other Ambulatory Visit: Payer: Self-pay | Admitting: Internal Medicine

## 2022-04-17 ENCOUNTER — Ambulatory Visit: Payer: 59 | Admitting: Nurse Practitioner

## 2022-04-30 ENCOUNTER — Other Ambulatory Visit: Payer: Self-pay | Admitting: Internal Medicine

## 2022-08-06 NOTE — Progress Notes (Deleted)
Complete Physical  Assessment and Plan:  Encounter for routine adult health examination without abnormal findings 1 year CPE  Essential hypertension - continue medications, DASH diet, exercise and monitor at home. Call if greater than 130/80.  -     CBC with Differential/Platelet -     COMPLETE METABOLIC PANEL WITH GFR -     TSH -     Urinalysis, Routine w reflex microscopic -     Microalbumin / creatinine urine ratio -     EKG 12-Lead  Morbid obesity BMI (HCC)  - follow up 3 months for progress monitoring - increase veggies, decrease carbs - long discussion about weight loss, diet, and exercise  Medication management -     Magnesium  Vitamin D deficiency -     VITAMIN D 25 Hydroxy (Vit-D Deficiency, Fractures)  OSA on CPAP Continue CPAP  Hyperlipidemia associated with type 2 diabetes mellitus (HCC) -     Lipid panel The rouvastatin 40mg  qd  Type 2 diabetes mellitus with stage 2 chronic kidney disease, without long-term current use of insulin (HCC) -     Hemoglobin A1c Discussed general issues about diabetes pathophysiology and management., Educational material distributed., Suggested low cholesterol diet., Encouraged aerobic exercise., Discussed foot care., Reminded to get yearly retinal exam.  CKD stage 2 due to type 2 diabetes mellitus (HCC) -     COMPLETE METABOLIC PANEL WITH GFR -     Hemoglobin A1c Increase fluids, avoid NSAIDS, monitor sugars, will monitor   Screening PSA (prostate specific antigen) -     PSA  Screening for Ischemic disease EKG  Screening for thyroid - TSH  Screening for AAA - [place] ABD LTD retroperitoneal    Discussed med's effects and SE's. Screening labs and tests as requested with regular follow-up as recommended. Over 40 minutes of exam, counseling, chart review and critical decision making was performed  Future Appointments  Date Time Provider [place]  08/10/2022 10:00 AM Alycia Rossetti, NP [facility] None      HPI Patient presents for a complete physical. Works in Hospital doctor. He has Essential hypertension; Hyperlipidemia; Vitamin D deficiency; Abnormal glucose; Medication management; Morbid obesity BMI (HCC) ; OSA on CPAP; Labile hypertension; CKD stage 2 due to type 2 diabetes mellitus ([place]); Type 2 diabetes mellitus with stage 2 chronic kidney disease, without long-term current use of insulin ([place]); and Hyperlipidemia associated with type 2 diabetes mellitus ([place]) on their problem list.   OSA followed by Dr. Brett Fairy , continues to use CPAP. Is getting new parts for his CPAP machine   He also has DOT physical, as he is a Administrator. He states he has a physical job. He does not recall an injury at work but states he lifts a lot. For 2 weeks he has had left inguinal/thigh pain, feels like pulled muscle, no radiation, worse when he first wakes up in the morning. Better with ibuprofen or aleve and after walking. No pain down his leg.      BMI is There is no height or weight on file to calculate BMI., he has been working on diet and exercise. Wt Readings from Last 3 Encounters:  01/02/22 266 lb (120.7 kg)  08/08/21 260 lb 12.8 oz (118.3 kg)  12/10/20 260 lb (117.9 kg)   His blood pressure has been controlled at home, today their BP is   He does workout (physically intense job). tHe denies chest pain, shortness of breath, dizziness.   He is on cholesterol medication (  switched to crestor 40 mg zetia 10 mg) and denies myalgias. His cholesterol is not at goal. The cholesterol last visit was:   Lab Results  Component Value Date   CHOL 173 01/02/2022   HDL 27 (L) 01/02/2022   LDLCALC 117 (H) 01/02/2022   TRIG 176 (H) 01/02/2022   CHOLHDL 6.4 (H) 01/02/2022    He has been working on diet and exercise for diabetes He states he has been very strict with diet, he is using stevia . Eating fresh fruits and vegetables.  Eating sugar free peanut butter. He is eating dave's killer bread if he does  eat. He is increasing his veggies.    With CKD not on ACE- last kidney function at goal With hyperlipidemia not at goal of 70- on crestor 40 mg changed last tiime to reach goal- having HA's- may adjust  he is on bASA  he is not on ACE/ARB  He was started on glipizide 5 mg 2 x a day Metformin 500 mg 2 pills a day Blood sugars are running 90- 145 and denies foot ulcerations, increased appetite, nausea, paresthesia of the feet, polydipsia, polyuria, visual disturbances, vomiting and weight loss.  Last A1C in the office was:  Lab Results  Component Value Date   HGBA1C 6.0 (H) 01/02/2022   Last GFR: Lab Results  Component Value Date   GFRNONAA 80 12/10/2020    Patient is on Vitamin D supplement.   Lab Results  Component Value Date   VD25OH 41 08/08/2021     Last PSA was: Lab Results  Component Value Date   PSA 2.68 08/08/2021    Current Medications:   Current Outpatient Medications (Endocrine & Metabolic):    glipiZIDE (GLUCOTROL) 5 MG tablet, TAKE 1 TABLET BY MOUTH THREE TIMES DAILY WITH MEALS FOR DIABETES   metFORMIN (GLUCOPHAGE-XR) 500 MG 24 hr tablet, TAKE 1 TO 2 TABLETS BY MOUTH TWICE DAILY WITH MEALS FOR DIABETES  Current Outpatient Medications (Cardiovascular):    ezetimibe (ZETIA) 10 MG tablet, Take 1 tablet (10 mg total) by mouth daily.   rosuvastatin (CRESTOR) 40 MG tablet, TAKE 1 TABLET BY MOUTH ONCE DAILY FOR CHOLESTEROL   Current Outpatient Medications (Analgesics):    BABY ASPIRIN PO, Take 81 mg by mouth daily.   meloxicam (MOBIC) 15 MG tablet, Take  1/2 to 1 tablet  Daily  with Food as needed for Pain & Inflammation & try limit to 5 days /week to Avoid Permanent Kidney Damage                                                      /                             Patient knows to take by mouth  !  Current Outpatient Medications (Hematological):    Cyanocobalamin (VITAMIN B-12 PO), Take by mouth daily.  Current Outpatient Medications (Other):    Cholecalciferol  (VITAMIN D PO), Take 5,000 Int'l Units by mouth in the morning and at bedtime.    Multiple Vitamin (MULTIVITAMIN) tablet, Take 1 tablet by mouth daily.   Omega-3 Fatty Acids (FISH OIL PO), Take 1,200 mg by mouth daily.   zinc gluconate 50 MG tablet, Take 50 mg by mouth daily.  Allergies:  No Known  Allergies   Health Maintenance:  Immunization History  Administered Date(s) Administered   Influenza Inj Mdck Quad With Preservative 10/10/2018   Influenza Split 10/08/2014   Influenza-Unspecified 08/07/2015   PFIZER(Purple Top)SARS-COV-2 Vaccination 04/07/2020   PPD Test 10/08/2014, 10/21/2015   Tdap 08/14/2009   Health Maintenance  Topic Date Due   FOOT EXAM  Never done   OPHTHALMOLOGY EXAM  Never done   Zoster Vaccines- Shingrix (1 of 2) Never done   TETANUS/TDAP  08/15/2019   COVID-19 Vaccine (2 - Pfizer risk series) 04/28/2020   COLONOSCOPY (Pts 45-78yrs Insurance coverage will need to be confirmed)  11/25/2021   INFLUENZA VACCINE  06/09/2022   HEMOGLOBIN A1C  07/02/2022   Diabetic kidney evaluation - Urine ACR  08/08/2022   Diabetic kidney evaluation - GFR measurement  01/02/2023   Hepatitis C Screening  Completed   HIV Screening  Completed   HPV VACCINES  Aged Out    Tetanus: 2010 DUE but getting COVID vaccines Pneumovax: N/A Prevnar 13: N/A Flu vaccine: 2020 Shingrix: declines- discussed   DEE 2019  DEXA: N/A Colonoscopy: 11/2011 10 year due 2023 EGD: N/A  [place] [address] last year Dentist: Last several years ago, has new insurance, will schedule appointment  Patient Care Team: Unk Pinto, MD as PCP - General (Internal Medicine) Richmond Campbell, MD as Consulting Physician (Gastroenterology)  Medical History:  has Essential hypertension; Hyperlipidemia; Vitamin D deficiency; Abnormal glucose; Medication management; Morbid obesity BMI (HCC) ; OSA on CPAP; Labile hypertension; CKD stage 2 due to type 2 diabetes mellitus ([place]); Type 2 diabetes  mellitus with stage 2 chronic kidney disease, without long-term current use of insulin ([place]); and Hyperlipidemia associated with type 2 diabetes mellitus ([place]) on their problem list. Surgical History:  He  has a past surgical history that includes Hemorrhoid banding (2013) and Nasal septum surgery (2004). Family History:  His family history includes Emphysema in his father; Heart attack in his mother; Heart disease in his father; Hyperlipidemia in his mother. Social History:   reports that he quit smoking about 26 years ago. He has never used smokeless tobacco. He reports that he does not drink alcohol and does not use drugs. Review of Systems:  Review of Systems  Constitutional:  Negative for chills, fever, malaise/fatigue and weight loss.  HENT:  Negative for hearing loss and tinnitus.   Eyes:  Negative for blurred vision and double vision.  Respiratory:  Negative for cough, shortness of breath and wheezing.   Cardiovascular:  Negative for chest pain, palpitations, orthopnea, claudication and leg swelling.  Gastrointestinal:  Negative for abdominal pain, blood in stool, constipation, diarrhea, heartburn, melena, nausea and vomiting.  Genitourinary:  Negative for dysuria.  Musculoskeletal:  Negative for back pain, joint pain and myalgias.  Skin:  Negative for rash.  Neurological:  Negative for dizziness, tingling, sensory change, weakness and headaches.  Endo/Heme/Allergies:  Negative for polydipsia.  Psychiatric/Behavioral: Negative.  Negative for depression. The patient does not have insomnia.   All other systems reviewed and are negative.   Physical Exam: Estimated body mass index is 38.17 kg/m as calculated from the following:   Height as of 08/08/21: 5\' 10"  (1.778 m).   Weight as of 01/02/22: 266 lb (120.7 kg). There were no vitals taken for this visit. General Appearance: Well nourished, in no apparent distress.  Eyes: PERRLA, EOMs, conjunctiva no swelling or erythema, normal  fundi and vessels.  Sinuses: No Frontal/maxillary tenderness  ENT/Mouth: Ext aud canals clear, normal light reflex with  TMs without erythema, bulging. Good dentition. No erythema, swelling, or exudate on post pharynx. Tonsils not swollen or erythematous. Hearing normal.  Neck: Supple, thyroid normal. No bruits  Respiratory: Respiratory effort normal, BS equal bilaterally without rales, rhonchi, wheezing or stridor.  Cardio: RRR without murmurs, rubs or gallops. Brisk peripheral pulses without edema.  Chest: symmetric, with normal excursions and percussion.  Abdomen: Soft, nontender, no guarding, rebound, hernias, masses, or organomegaly.  Lymphatics: Non tender without lymphadenopathy.  Genitourinary:  Musculoskeletal: Full ROM all peripheral extremities,5/5 strength, and normal gait.  Skin: Warm, dry. 1cm hemangioma of back Neuro: Cranial nerves intact, reflexes equal bilaterally. Normal muscle tone, no cerebellar symptoms. Sensation intact.  Psych: Awake and oriented X 3, normal affect, Insight and Judgment appropriate.   EKG: WNL no  ST changes.   Alycia Rossetti 9:36 AM [facility]

## 2022-08-10 ENCOUNTER — Encounter: Payer: BC Managed Care – PPO | Admitting: Nurse Practitioner

## 2022-09-08 ENCOUNTER — Encounter: Payer: Self-pay | Admitting: Nurse Practitioner

## 2022-09-08 ENCOUNTER — Ambulatory Visit (INDEPENDENT_AMBULATORY_CARE_PROVIDER_SITE_OTHER): Payer: BC Managed Care – PPO | Admitting: Nurse Practitioner

## 2022-09-08 VITALS — BP 148/98 | HR 70 | Temp 97.1°F | Ht 70.5 in | Wt 268.2 lb

## 2022-09-08 DIAGNOSIS — I10 Essential (primary) hypertension: Secondary | ICD-10-CM

## 2022-09-08 DIAGNOSIS — Z131 Encounter for screening for diabetes mellitus: Secondary | ICD-10-CM

## 2022-09-08 DIAGNOSIS — R350 Frequency of micturition: Secondary | ICD-10-CM | POA: Diagnosis not present

## 2022-09-08 DIAGNOSIS — Z0001 Encounter for general adult medical examination with abnormal findings: Secondary | ICD-10-CM

## 2022-09-08 DIAGNOSIS — Z23 Encounter for immunization: Secondary | ICD-10-CM

## 2022-09-08 DIAGNOSIS — Z79899 Other long term (current) drug therapy: Secondary | ICD-10-CM

## 2022-09-08 DIAGNOSIS — Z13220 Encounter for screening for lipoid disorders: Secondary | ICD-10-CM | POA: Diagnosis not present

## 2022-09-08 DIAGNOSIS — Z0000 Encounter for general adult medical examination without abnormal findings: Secondary | ICD-10-CM

## 2022-09-08 DIAGNOSIS — N401 Enlarged prostate with lower urinary tract symptoms: Secondary | ICD-10-CM | POA: Diagnosis not present

## 2022-09-08 DIAGNOSIS — E1169 Type 2 diabetes mellitus with other specified complication: Secondary | ICD-10-CM

## 2022-09-08 DIAGNOSIS — E1122 Type 2 diabetes mellitus with diabetic chronic kidney disease: Secondary | ICD-10-CM

## 2022-09-08 DIAGNOSIS — Z125 Encounter for screening for malignant neoplasm of prostate: Secondary | ICD-10-CM

## 2022-09-08 DIAGNOSIS — Z136 Encounter for screening for cardiovascular disorders: Secondary | ICD-10-CM | POA: Diagnosis not present

## 2022-09-08 DIAGNOSIS — Z1389 Encounter for screening for other disorder: Secondary | ICD-10-CM | POA: Diagnosis not present

## 2022-09-08 DIAGNOSIS — E559 Vitamin D deficiency, unspecified: Secondary | ICD-10-CM

## 2022-09-08 DIAGNOSIS — E782 Mixed hyperlipidemia: Secondary | ICD-10-CM

## 2022-09-08 DIAGNOSIS — M79671 Pain in right foot: Secondary | ICD-10-CM

## 2022-09-08 DIAGNOSIS — E785 Hyperlipidemia, unspecified: Secondary | ICD-10-CM

## 2022-09-08 DIAGNOSIS — G4733 Obstructive sleep apnea (adult) (pediatric): Secondary | ICD-10-CM

## 2022-09-08 NOTE — Progress Notes (Signed)
Complete Physical  Assessment and Plan:  Encounter for routine adult health examination without abnormal findings Due annually  Essential hypertension Discussed DASH (Dietary Approaches to Stop Hypertension) DASH diet is lower in sodium than a typical American diet. Cut back on foods that are high in saturated fat, cholesterol, and trans fats. Eat more whole-grain foods, fish, poultry, and nuts Remain active and exercise as tolerated daily.  Monitor BP at home-Call if greater than 130/80.  Check CMP/CBC   Morbid obesity BMI ([place])  Discussed appropriate BMI Goal of losing 1 lb per month. Diet modification. Physical activity. Encouraged/praised to build confidence.   Medication management All medications discussed and reviewed in full. All questions and concerns regarding medications addressed.    Vitamin D deficiency Continue supplement Monitor levels  OSA on CPAP Controlled Continue CPAP  Hyperlipidemia associated with type 2 diabetes mellitus ([place]) Discussed lifestyle modifications. Recommended diet heavy in fruits and veggies, omega 3's. Decrease consumption of animal meats, cheeses, and dairy products. Remain active and exercise as tolerated. Continue to monitor. Check lipids/TSH   Type 2 diabetes mellitus with stage 2 chronic kidney disease, without long-term current use of insulin [facility]) Education: Reviewed 'ABCs' of diabetes management  Discussed goals to be met and/or maintained include A1C (<7) Blood pressure (<130/80) Cholesterol (LDL <70) Continue Eye Exam yearly  Continue Dental Exam Q6 mo Discussed dietary recommendations Discussed Physical Activity recommendations Foot exam UTD Check A1C   CKD stage 2 due to type 2 diabetes mellitus (HCC) Continue ACE, bASA, statin Discussed how what you eat and drink can aide in kidney protection. Stay well hydrated. Avoid high salt foods. Avoid NSAIDS. Keep BP and BG well controlled.   Take medications  as prescribed. Remain active and exercise as tolerated daily. Maintain weight.  Continue to monitor. Check CMP/GFR/Microablumin  Screening PSA (prostate specific antigen) Check and monitor PSA  Screening for Ischemic disease Check and monitor EKG  Screening for proteinuria or hematuria Check and monitor UA  Need for flu vaccine Administered  Bilateral foot pain Suggested good feet store to measure for custom inserts. 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  Complains of pain in feet.  Completed DOT physical every year.  OSA followed by Dr. Brett Fairy , continues to use CPAP, reports working well.    BMI is Body mass index is 37.94 kg/m., he has been working on diet and exercise. Wt Readings from Last 3 Encounters:  09/08/22 268 lb 3.2 oz (121.7 kg)  01/02/22 266 lb (120.7 kg)  08/08/21 260 lb 12.8 oz (118.3 kg)   His blood pressure has been controlled at home, today their BP is BP: (!) 148/98 He does workout (physically intense job). tHe denies chest pain, shortness of breath, dizziness.   He is  on cholesterol medication (switched to crestor 40 mg zetia 10 mg) and denies myalgias. His cholesterol is not at goal. The cholesterol  last visit was:   Lab Results  Component Value Date   CHOL 173 01/02/2022   HDL 27 (L) 01/02/2022   LDLCALC 117 (H) 01/02/2022   TRIG 176 (H) 01/02/2022   CHOLHDL 6.4 (H) 01/02/2022    He has been working on diet and exercise for diabetes He states he has been very strict with diet, he is using stevia . Eating fresh fruits and vegetables.  Eating sugar free peanut butter. He is eating dave's killer bread if he does eat. He is increasing his veggies.    With CKD not on ACE- last kidney function at goal With hyperlipidemia not at goal of 70- on crestor 40 mg changed last tiime to reach goal- having HA's- may adjust  he is on bASA  he is not on ACE/ARB  He was started on glipizide 5 mg 2 x a day Metformin 500 mg 2 pills a day Blood sugars are running 70- 130 and denies foot ulcerations, increased appetite, nausea, paresthesia of the feet, polydipsia, polyuria, visual disturbances, vomiting and weight loss.  Last A1C in the office was:  Lab Results  Component Value Date   HGBA1C 6.0 (H) 01/02/2022   Last GFR: Lab Results  Component Value Date   GFRNONAA 80 12/10/2020    Patient is on Vitamin D supplement.   Lab Results  Component Value Date   VD25OH 11 08/08/2021     Last PSA was: Lab Results  Component Value Date   PSA 2.68 08/08/2021    Current Medications:   Current Outpatient Medications (Endocrine & Metabolic):    glipiZIDE (GLUCOTROL) 5 MG tablet, TAKE 1 TABLET BY MOUTH THREE TIMES DAILY WITH MEALS FOR DIABETES   metFORMIN (GLUCOPHAGE-XR) 500 MG 24 hr tablet, TAKE 1 TO 2 TABLETS BY MOUTH TWICE DAILY WITH MEALS FOR DIABETES  Current Outpatient Medications (Cardiovascular):    rosuvastatin (CRESTOR) 40 MG tablet, TAKE 1 TABLET BY MOUTH ONCE DAILY FOR CHOLESTEROL   ezetimibe (ZETIA) 10 MG tablet, Take 1 tablet (10 mg total) by mouth daily.   Current Outpatient Medications (Analgesics):    BABY ASPIRIN PO, Take 81 mg by mouth daily.   meloxicam (MOBIC) 15 MG  tablet, Take  1/2 to 1 tablet  Daily  with Food as needed for Pain & Inflammation & try limit to 5 days /week to Avoid Permanent Kidney Damage                                                      /                             Patient knows to take by mouth  !  Current Outpatient Medications (Hematological):    Cyanocobalamin (VITAMIN B-12 PO), Take by mouth daily.  Current Outpatient Medications (Other):    Cholecalciferol (VITAMIN D PO), Take 5,000 Int'l Units by mouth in the morning and at bedtime.    Multiple Vitamin (MULTIVITAMIN) tablet, Take 1 tablet by mouth daily.   Omega-3 Fatty Acids (FISH OIL PO), Take 1,200 mg by mouth daily.   zinc gluconate 50 MG tablet, Take 50 mg by mouth daily.  Allergies:  No Known Allergies   Health Maintenance:  Immunization History  Administered Date(s) Administered   Influenza Inj Mdck Quad With Preservative 10/10/2018   Influenza Split 10/08/2014, 08/21/2020   Influenza-Unspecified 08/07/2015   PFIZER Comirnaty(Gray Top)Covid-19 Tri-Sucrose Vaccine 04/28/2020   PFIZER(Purple Top)SARS-COV-2 Vaccination 04/07/2020   PPD Test 10/08/2014, 10/21/2015   Pfizer Covid-19 Vaccine Bivalent Booster 73yr & up 11/30/2020   Tdap 08/14/2009   Health Maintenance  Topic Date Due   FOOT EXAM  Never done   OPHTHALMOLOGY EXAM  Never done   Zoster Vaccines- Shingrix (1 of 2) Never done   TETANUS/TDAP  08/15/2019   COVID-19 Vaccine (4 - Pfizer risk series) 01/25/2021   COLONOSCOPY (Pts 45-486yrInsurance coverage will need to be confirmed)  11/25/2021   INFLUENZA VACCINE  06/09/2022   HEMOGLOBIN A1C  07/02/2022   Diabetic kidney evaluation - Urine ACR  08/08/2022   Diabetic kidney evaluation - GFR measurement  01/02/2023   Hepatitis C Screening  Completed   HIV Screening  Completed   HPV VACCINES  Aged Out    Tetanus: 2010 DUE but getting COVID vaccines Pneumovax: N/A Prevnar 13: N/A Flu vaccine: 09/08/22 Shingrix: declines- discussed   DEE  2019  DEXA: N/A Colonoscopy: 11/2011 10 year due 2023 - Follows with GI EGD: N/A  [place] [address] year Dentist: Last several years ago, has new insurance, will schedule appointment  Patient Care Team: McUnk PintoMD as PCP - General (Internal Medicine) MeRichmond CampbellMD as Consulting Physician (Gastroenterology)  Medical History:  has Essential hypertension; Hyperlipidemia; Vitamin D deficiency; Abnormal glucose; Medication management; Morbid obesity BMI (HCC) ; OSA on CPAP; Labile hypertension; CKD stage 2 due to type 2 diabetes mellitus ([place] Type 2 diabetes mellitus with stage 2 chronic kidney disease, without long-term current use of insulin ([facility] and Hyperlipidemia associated with type 2 diabetes mellitus ([place] problem [place]. Surgical History:  He  has a past surgical history that includes Hemorrhoid banding (2013) and Nasal septum surgery (2004). Family History:  His family history includes Emphysema in his father; Heart attack in his mother; Heart disease in his father; Hyperlipidemia in his mother. Social History:   reports that he quit smoking about 26 years ago. His smoking use included cigarettes. He has never used smokeless tobacco. He reports that he does not drink alcohol and does not use drugs. Review of Systems:  Review of Systems  Constitutional:  Negative for chills, fever, malaise/fatigue and weight loss.  HENT:  Negative for hearing loss and tinnitus.   Eyes:  Negative for blurred vision and double vision.  Respiratory:  Negative for cough, shortness of breath and wheezing.   Cardiovascular:  Negative for chest pain, palpitations, orthopnea, claudication and leg swelling.  Gastrointestinal:  Negative for abdominal pain, blood in stool, constipation, diarrhea, heartburn, melena, nausea and vomiting.  Genitourinary:  Negative for dysuria.  Musculoskeletal:  Negative for back pain, joint pain and myalgias.  Skin:  Negative for rash.   Neurological:  Negative for dizziness, tingling, sensory change, weakness and headaches.  Endo/Heme/Allergies:  Negative for polydipsia.  Psychiatric/Behavioral: Negative.  Negative for depression. The patient does not have insomnia.   All other systems reviewed and are negative.   Physical Exam: Estimated body mass index is 37.94 kg/m as calculated from the following:   Height as of this encounter: 5' 10.5" (1.791 m).   Weight as of this encounter: 268 lb 3.2 oz (121.7 kg). BP (!) 148/98   Pulse 70  Temp (!) 97.1 F (36.2 C)   Ht 5' 10.5" (1.791 m)   Wt 268 lb 3.2 oz (121.7 kg)   SpO2 97%   BMI 37.94 kg/m  General Appearance: Well nourished, in no apparent distress.  Eyes: PERRLA, EOMs, conjunctiva no swelling or erythema, normal fundi and vessels.  Sinuses: No Frontal/maxillary tenderness  ENT/Mouth: Ext aud canals clear, normal light reflex with TMs without erythema, bulging. Good dentition. No erythema, swelling, or exudate on post pharynx. Tonsils not swollen or erythematous. Hearing normal.  Neck: Supple, thyroid normal. No bruits  Respiratory: Respiratory effort normal, BS equal bilaterally without rales, rhonchi, wheezing or stridor.  Cardio: RRR without murmurs, rubs or gallops. Brisk peripheral pulses without edema.  Chest: symmetric, with normal excursions and percussion.  Abdomen: Soft, nontender, no guarding, rebound, hernias, masses, or organomegaly.  Lymphatics: Non tender without lymphadenopathy.  Genitourinary:  Musculoskeletal: Full ROM all peripheral extremities,5/5 strength, and normal gait.  Skin: Warm, dry. 1cm hemangioma of back Neuro: Cranial nerves intact, reflexes equal bilaterally. Normal muscle tone, no cerebellar symptoms. Sensation intact.  Psych: Awake and oriented X 3, normal affect, Insight and Judgment appropriate.   EKG: NSR no  ST changes.   Larry Hudson 10:43 AM [facility]

## 2022-09-10 LAB — TSH: TSH: 1.88 m[IU]/L (ref 0.40–4.50)

## 2022-09-10 LAB — CBC WITH DIFFERENTIAL/PLATELET
Absolute Monocytes: 504 {cells}/uL (ref 200–950)
Basophils Absolute: 42 {cells}/uL (ref 0–200)
Basophils Relative: 0.8 %
Eosinophils Absolute: 122 {cells}/uL (ref 15–500)
Eosinophils Relative: 2.3 %
HCT: 44.4 % (ref 38.5–50.0)
Hemoglobin: 14.8 g/dL (ref 13.2–17.1)
Lymphs Abs: 1993 {cells}/uL (ref 850–3900)
MCH: 27.4 pg (ref 27.0–33.0)
MCHC: 33.3 g/dL (ref 32.0–36.0)
MCV: 82.2 fL (ref 80.0–100.0)
MPV: 11.3 fL (ref 7.5–12.5)
Monocytes Relative: 9.5 %
Neutro Abs: 2639 {cells}/uL (ref 1500–7800)
Neutrophils Relative %: 49.8 %
Platelets: 233 Thousand/uL (ref 140–400)
RBC: 5.4 Million/uL (ref 4.20–5.80)
RDW: 13.3 % (ref 11.0–15.0)
Total Lymphocyte: 37.6 %
WBC: 5.3 Thousand/uL (ref 3.8–10.8)

## 2022-09-10 LAB — LIPID PANEL
Cholesterol: 195 mg/dL
HDL: 30 mg/dL — ABNORMAL LOW
LDL Cholesterol (Calc): 125 mg/dL — ABNORMAL HIGH
Non-HDL Cholesterol (Calc): 165 mg/dL — ABNORMAL HIGH
Total CHOL/HDL Ratio: 6.5 (calc) — ABNORMAL HIGH
Triglycerides: 263 mg/dL — ABNORMAL HIGH

## 2022-09-10 LAB — COMPLETE METABOLIC PANEL WITHOUT GFR
AG Ratio: 1.8 (calc) (ref 1.0–2.5)
ALT: 25 U/L (ref 9–46)
AST: 23 U/L (ref 10–35)
Albumin: 4.6 g/dL (ref 3.6–5.1)
Alkaline phosphatase (APISO): 56 U/L (ref 35–144)
BUN: 14 mg/dL (ref 7–25)
CO2: 27 mmol/L (ref 20–32)
Calcium: 9.5 mg/dL (ref 8.6–10.3)
Chloride: 106 mmol/L (ref 98–110)
Creat: 1.02 mg/dL (ref 0.70–1.35)
Globulin: 2.5 g/dL (ref 1.9–3.7)
Glucose, Bld: 104 mg/dL — ABNORMAL HIGH (ref 65–99)
Potassium: 4.6 mmol/L (ref 3.5–5.3)
Sodium: 141 mmol/L (ref 135–146)
Total Bilirubin: 1 mg/dL (ref 0.2–1.2)
Total Protein: 7.1 g/dL (ref 6.1–8.1)
eGFR: 84 mL/min/1.73m2

## 2022-09-10 LAB — URINALYSIS, ROUTINE W REFLEX MICROSCOPIC
Bilirubin Urine: NEGATIVE
Glucose, UA: NEGATIVE
Hgb urine dipstick: NEGATIVE
Ketones, ur: NEGATIVE
Leukocytes,Ua: NEGATIVE
Nitrite: NEGATIVE
Protein, ur: NEGATIVE
Specific Gravity, Urine: 1.022 (ref 1.001–1.035)
pH: 6 (ref 5.0–8.0)

## 2022-09-10 LAB — HEMOGLOBIN A1C
Hgb A1c MFr Bld: 6.5 % of total Hgb — ABNORMAL HIGH (ref <5.7)
Mean Plasma Glucose: 140 mg/dL
eAG (mmol/L): 7.7 mmol/L

## 2022-09-10 LAB — PSA: PSA: 2.45 ng/mL (ref <=4.00)

## 2022-09-10 LAB — MICROALBUMIN / CREATININE URINE RATIO
Creatinine, Urine: 165 mg/dL (ref 20–320)
Microalb Creat Ratio: 14 mcg/mg creat (ref <30)
Microalb, Ur: 2.3 mg/dL

## 2022-09-10 LAB — INSULIN, RANDOM: Insulin: 35 u[IU]/mL — ABNORMAL HIGH

## 2022-09-10 LAB — VITAMIN D 25 HYDROXY (VIT D DEFICIENCY, FRACTURES): Vit D, 25-Hydroxy: 67 ng/mL (ref 30–100)

## 2022-09-10 LAB — MAGNESIUM: Magnesium: 1.9 mg/dL (ref 1.5–2.5)

## 2022-09-27 ENCOUNTER — Other Ambulatory Visit: Payer: Self-pay | Admitting: Nurse Practitioner

## 2022-12-12 ENCOUNTER — Other Ambulatory Visit: Payer: Self-pay | Admitting: Nurse Practitioner

## 2022-12-15 ENCOUNTER — Ambulatory Visit (INDEPENDENT_AMBULATORY_CARE_PROVIDER_SITE_OTHER): Payer: BC Managed Care – PPO | Admitting: Nurse Practitioner

## 2022-12-15 ENCOUNTER — Encounter: Payer: Self-pay | Admitting: Nurse Practitioner

## 2022-12-15 VITALS — BP 140/82 | HR 76 | Temp 97.5°F | Ht 70.5 in | Wt 268.2 lb

## 2022-12-15 DIAGNOSIS — L209 Atopic dermatitis, unspecified: Secondary | ICD-10-CM

## 2022-12-15 DIAGNOSIS — E1122 Type 2 diabetes mellitus with diabetic chronic kidney disease: Secondary | ICD-10-CM | POA: Diagnosis not present

## 2022-12-15 DIAGNOSIS — Z79899 Other long term (current) drug therapy: Secondary | ICD-10-CM

## 2022-12-15 DIAGNOSIS — E559 Vitamin D deficiency, unspecified: Secondary | ICD-10-CM

## 2022-12-15 DIAGNOSIS — E785 Hyperlipidemia, unspecified: Secondary | ICD-10-CM | POA: Diagnosis not present

## 2022-12-15 DIAGNOSIS — N182 Chronic kidney disease, stage 2 (mild): Secondary | ICD-10-CM

## 2022-12-15 DIAGNOSIS — R21 Rash and other nonspecific skin eruption: Secondary | ICD-10-CM

## 2022-12-15 DIAGNOSIS — I10 Essential (primary) hypertension: Secondary | ICD-10-CM

## 2022-12-15 DIAGNOSIS — G4733 Obstructive sleep apnea (adult) (pediatric): Secondary | ICD-10-CM

## 2022-12-15 DIAGNOSIS — E1169 Type 2 diabetes mellitus with other specified complication: Secondary | ICD-10-CM

## 2022-12-15 MED ORDER — TRIAMCINOLONE ACETONIDE 0.1 % EX OINT: 1.0000 | TOPICAL_OINTMENT | Freq: Two times a day (BID) | CUTANEOUS | 1 refills | Status: DC

## 2022-12-15 NOTE — Patient Instructions (Signed)

## 2022-12-15 NOTE — Progress Notes (Signed)
Follow Up  Assessment and Plan:  Essential hypertension Not on medications. Discussed DASH (Dietary Approaches to Stop Hypertension) DASH diet is lower in sodium than a typical American diet. Cut back on foods that are high in saturated fat, cholesterol, and trans fats. Eat more whole-grain foods, fish, poultry, and nuts Remain active and exercise as tolerated daily.  Monitor BP at home-Call if greater than 130/80.  Check CMP/CBC   Morbid obesity BMI ([place])  Discussed appropriate BMI Diet modification. Physical activity. Encouraged/praised to build confidence.   Medication management All medications discussed and reviewed in full. All questions and concerns regarding medications addressed.    Vitamin D deficiency Continue supplement Monitor levels  OSA on CPAP Controlled Continue CPAP  Hyperlipidemia associated with type 2 diabetes mellitus (HCC) Continue Rosuvastatin Discussed lifestyle modifications. Recommended diet heavy in fruits and veggies, omega 3's. Decrease consumption of animal meats, cheeses, and dairy products. Remain active and exercise as tolerated. Continue to monitor. Check lipids/TSH   Type 2 diabetes mellitus with stage 2 chronic kidney disease, without long-term current use of insulin (HCC) Continue Rosuvastatin Education: Reviewed 'ABCs' of diabetes management  Discussed goals to be met and/or maintained include A1C (<7) Blood pressure (<130/80) Cholesterol (LDL <70) Continue Eye Exam yearly  Continue Dental Exam Q6 mo Discussed dietary recommendations Discussed Physical Activity recommendations Foot exam UTD Check A1C   CKD stage 2 due to type 2 diabetes mellitus (HCC) Continue bASA, statin Discussed how what you eat and drink can aide in kidney protection. Stay well hydrated. Avoid high salt foods. Avoid NSAIDS. Keep BP and BG well controlled.   Take medications as prescribed. Remain active and exercise as tolerated  daily. Maintain weight.  Continue to monitor. Check CMP/GFR/Microablumin   Bilateral foot pain Suggested good feet store to measure for custom inserts. Referral to Podiatry if s/s fail to improve  Atopic Dermatitis Continue triamcinolone Continue to monitor  Orders Placed This Encounter  Procedures   CBC with Differential/Platelet   COMPLETE METABOLIC PANEL WITH GFR   Lipid panel   Hemoglobin A1c    Notify office for further evaluation and treatment, questions or concerns if any reported s/s fail to improve.   The patient was advised to call back or seek an in-person evaluation if any symptoms worsen or if the condition fails to improve as anticipated.   Further disposition pending results of labs. Discussed med's effects and SE's.    I discussed the assessment and treatment plan with the patient. The patient was provided an opportunity to ask questions and all were answered. The patient agreed with the plan and demonstrated an understanding of the instructions.  Discussed med's effects and SE's. Screening labs and tests as requested with regular follow-up as recommended.  I provided 25 minutes of face-to-face time during this encounter including counseling, chart review, and critical decision making was preformed.   Future Appointments  Date Time Provider [place]  09/09/2023 10:00 AM Darrol Jump, NP [facility] None     HPI Patient presents for a general follow up. He has Essential hypertension; Hyperlipidemia; Vitamin D deficiency; Abnormal glucose; Medication management; Morbid obesity BMI (HCC) ; OSA on CPAP; Labile hypertension; CKD stage 2 due to type 2 diabetes mellitus ([place]); Type 2 diabetes mellitus with stage 2 chronic kidney disease, without long-term current use of insulin ([place]); and Hyperlipidemia associated with type 2 diabetes mellitus ([place]) on their problem list.   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  Complains of pain in feet.  Completed DOT physical every year.    At times, he notices an itchy rash mid back area.  Feels this may be d/t sitting in his truck driving long hours causing back to sweat and create itchy rash.  He has applied triamcinolone cream in the past with effectiveness.  Currently has a mild outbreak.  Has been using A&D ointment with minimal relief.    OSA followed by Dr. Brett Fairy , continues to use CPAP, reports working well.    BMI is Body mass index is 37.94 kg/m., he has been working on diet and exercise. Wt Readings from Last 3 Encounters:  12/15/22 268 lb 3.2 oz (121.7 kg)  09/08/22 268 lb 3.2 oz (121.7 kg)  01/02/22 266 lb (120.7 kg)   His blood pressure has been controlled at home, today their BP is BP: (!) 140/82 He does workout (physically intense job). tHe denies chest pain, shortness of breath, dizziness.   He is on cholesterol medication (switched to crestor 40 mg zetia 10 mg) and denies myalgias. His cholesterol is not at goal. The cholesterol last visit was:   Lab Results  Component Value Date   CHOL 195 09/08/2022   HDL 30 (L) 09/08/2022   LDLCALC 125 (H) 09/08/2022   TRIG 263 (H) 09/08/2022   CHOLHDL 6.5 (H) 09/08/2022    He has been working on diet and exercise for diabetes He states he has been very strict with diet since having elevated A1c.   With CKD not on ACE- last kidney function at goal With hyperlipidemia not at goal of 70- on crestor 40 mg changed last tiime to reach goal- having HA's- may adjust  he is on bASA  he is not on ACE/ARB  He was started on glipizide 5 mg 2 x a day Metformin 500 mg 2 pills a day Blood sugars are running 70- 115 and denies foot ulcerations, increased appetite, nausea, paresthesia of the feet, polydipsia, polyuria, visual disturbances, vomiting and weight loss.  Last A1C in the office was:  Lab Results  Component Value Date   HGBA1C 6.5 (H)  09/08/2022   Last GFR: Lab Results  Component Value Date   GFRNONAA 80 12/10/2020    Patient is on Vitamin D supplement.   Lab Results  Component Value Date   VD25OH 67 09/08/2022      Current Medications:   Current Outpatient Medications (Endocrine & Metabolic):    glipiZIDE (GLUCOTROL) 5 MG tablet, TAKE 1 TABLET BY MOUTH THREE TIMES DAILY WITH MEALS FOR DIABETES   metFORMIN (GLUCOPHAGE-XR) 500 MG 24 hr tablet, TAKE 1 TO 2 TABLETS BY MOUTH TWICE DAILY WITH MEALS FOR DIABETES  Current Outpatient Medications (Cardiovascular):    rosuvastatin (CRESTOR) 40 MG tablet, TAKE 1 TABLET BY MOUTH ONCE DAILY FOR CHOLESTEROL   ezetimibe (ZETIA) 10 MG tablet, Take 1 tablet (10 mg total) by mouth daily.   Current Outpatient Medications (Analgesics):    BABY ASPIRIN PO, Take 81 mg by mouth daily.   meloxicam (MOBIC) 15 MG tablet, Take  1/2 to 1 tablet  Daily  with Food as needed for Pain & Inflammation & try limit to 5 days /week to Avoid Permanent Kidney Damage                                                      /  Patient knows to take by mouth  !  Current Outpatient Medications (Hematological):    Cyanocobalamin (VITAMIN B-12 PO), Take by mouth daily.  Current Outpatient Medications (Other):    Cholecalciferol (VITAMIN D PO), Take 5,000 Int'l Units by mouth in the morning and at bedtime.    Multiple Vitamin (MULTIVITAMIN) tablet, Take 1 tablet by mouth daily.   Omega-3 Fatty Acids (FISH OIL PO), Take 1,200 mg by mouth daily.   zinc gluconate 50 MG tablet, Take 50 mg by mouth daily.  Allergies:  No Known Allergies   Health Maintenance:  Immunization History  Administered Date(s) Administered   Influenza Inj Mdck Quad With Preservative 10/10/2018   Influenza Split 10/08/2014, 08/21/2020   Influenza,inj,Quad PF,6+ Mos 09/08/2022   Influenza-Unspecified 08/07/2015   PFIZER Comirnaty(Gray Top)Covid-19 Tri-Sucrose Vaccine 04/28/2020   PFIZER(Purple  Top)SARS-COV-2 Vaccination 04/07/2020   PPD Test 10/08/2014, 10/21/2015   Pfizer Covid-19 Vaccine Bivalent Booster 59yrs & up 11/30/2020   Tdap 08/14/2009   Health Maintenance  Topic Date Due   FOOT EXAM  Never done   OPHTHALMOLOGY EXAM  Never done   Zoster Vaccines- Shingrix (1 of 2) Never done   DTaP/Tdap/Td (2 - Td or Tdap) 08/15/2019   COLONOSCOPY (Pts 45-83yrs Insurance coverage will need to be confirmed)  11/25/2021   COVID-19 Vaccine (4 - 2023-24 season) 07/10/2022   HEMOGLOBIN A1C  03/09/2023   Diabetic kidney evaluation - eGFR measurement  09/09/2023   Diabetic kidney evaluation - Urine ACR  09/09/2023   INFLUENZA VACCINE  Completed   Hepatitis C Screening  Completed   HIV Screening  Completed   HPV VACCINES  Aged Out    Patient Care Team: Unk Pinto, MD as PCP - General (Internal Medicine) Richmond Campbell, MD as Consulting Physician (Gastroenterology)  Medical History:  has Essential hypertension; Hyperlipidemia; Vitamin D deficiency; Abnormal glucose; Medication management; Morbid obesity BMI (HCC) ; OSA on CPAP; Labile hypertension; CKD stage 2 due to type 2 diabetes mellitus ([place]); Type 2 diabetes mellitus with stage 2 chronic kidney disease, without long-term current use of insulin ([place]); and Hyperlipidemia associated with type 2 diabetes mellitus ([place]) on their problem list. Surgical History:  He  has a past surgical history that includes Hemorrhoid banding (2013) and Nasal septum surgery (2004). Family History:  His family history includes Emphysema in his father; Heart attack in his mother; Heart disease in his father; Hyperlipidemia in his mother. Social History:   reports that he quit smoking about 26 years ago. His smoking use included cigarettes. He has never used smokeless tobacco. He reports that he does not drink alcohol and does not use drugs.  Review of Systems: A complete ROS was performed with pertinent positives/negatives noted in the HPI. The  remainder of the ROS are negative.  Physical Exam: Estimated body mass index is 37.94 kg/m as calculated from the following:   Height as of this encounter: 5' 10.5" (1.791 m).   Weight as of this encounter: 268 lb 3.2 oz (121.7 kg). BP (!) 140/82   Pulse 76   Temp (!) 97.5 F (36.4 C)   Ht 5' 10.5" (1.791 m)   Wt 268 lb 3.2 oz (121.7 kg)   SpO2 99%   BMI 37.94 kg/m  General Appearance: Well nourished, in no apparent distress.  Eyes: PERRLA, EOMs, conjunctiva no swelling or erythema, normal fundi and vessels.  Sinuses: No Frontal/maxillary tenderness  ENT/Mouth: Ext aud canals clear, normal light reflex with TMs without erythema, bulging. Good dentition. No erythema, swelling,  or exudate on post pharynx. Tonsils not swollen or erythematous. Hearing normal.  Neck: Supple, thyroid normal. No bruits  Respiratory: Respiratory effort normal, BS equal bilaterally without rales, rhonchi, wheezing or stridor.  Cardio: RRR without murmurs, rubs or gallops. Brisk peripheral pulses without edema.  Chest: symmetric, with normal excursions and percussion.  Abdomen: Soft, nontender, no guarding, rebound, hernias, masses, or organomegaly.  Lymphatics: Non tender without lymphadenopathy.  Genitourinary:  Musculoskeletal: Full ROM all peripheral extremities,5/5 strength, and normal gait.  Skin: Scattered areas of flat and raised macular rash along mid back area.  Warm, dry. 1cm hemangioma of back Neuro: Cranial nerves intact, reflexes equal bilaterally. Normal muscle tone, no cerebellar symptoms. Sensation intact.  Psych: Awake and oriented X 3, normal affect, Insight and Judgment appropriate.   Kymiah Araiza 10:44 AM [facility]

## 2022-12-16 LAB — LIPID PANEL
Cholesterol: 203 mg/dL — ABNORMAL HIGH (ref <200)
HDL: 30 mg/dL — ABNORMAL LOW (ref >=40)
LDL Cholesterol (Calc): 129 mg/dL (calc) — ABNORMAL HIGH
Non-HDL Cholesterol (Calc): 173 mg/dL (calc) — ABNORMAL HIGH (ref <130)
Total CHOL/HDL Ratio: 6.8 (calc) — ABNORMAL HIGH (ref <5.0)
Triglycerides: 285 mg/dL — ABNORMAL HIGH (ref <150)

## 2022-12-16 LAB — CBC WITH DIFFERENTIAL/PLATELET
Absolute Monocytes: 570 cells/uL (ref 200–950)
Basophils Absolute: 51 cells/uL (ref 0–200)
Basophils Relative: 0.8 %
Eosinophils Absolute: 128 cells/uL (ref 15–500)
Eosinophils Relative: 2 %
HCT: 46.1 % (ref 38.5–50.0)
Hemoglobin: 15.3 g/dL (ref 13.2–17.1)
Lymphs Abs: 1619 cells/uL (ref 850–3900)
MCH: 27.4 pg (ref 27.0–33.0)
MCHC: 33.2 g/dL (ref 32.0–36.0)
MCV: 82.5 fL (ref 80.0–100.0)
MPV: 11.4 fL (ref 7.5–12.5)
Monocytes Relative: 8.9 %
Neutro Abs: 4032 cells/uL (ref 1500–7800)
Neutrophils Relative %: 63 %
Platelets: 243 10*3/uL (ref 140–400)
RBC: 5.59 10*6/uL (ref 4.20–5.80)
RDW: 13.7 % (ref 11.0–15.0)
Total Lymphocyte: 25.3 %
WBC: 6.4 10*3/uL (ref 3.8–10.8)

## 2022-12-16 LAB — COMPLETE METABOLIC PANEL WITH GFR
AG Ratio: 1.9 (calc) (ref 1.0–2.5)
ALT: 28 U/L (ref 9–46)
AST: 21 U/L (ref 10–35)
Albumin: 4.8 g/dL (ref 3.6–5.1)
Alkaline phosphatase (APISO): 53 U/L (ref 35–144)
BUN: 13 mg/dL (ref 7–25)
CO2: 27 mmol/L (ref 20–32)
Calcium: 9.7 mg/dL (ref 8.6–10.3)
Chloride: 105 mmol/L (ref 98–110)
Creat: 0.9 mg/dL (ref 0.70–1.35)
Globulin: 2.5 g/dL (calc) (ref 1.9–3.7)
Glucose, Bld: 100 mg/dL — ABNORMAL HIGH (ref 65–99)
Potassium: 4.6 mmol/L (ref 3.5–5.3)
Sodium: 140 mmol/L (ref 135–146)
Total Bilirubin: 1.1 mg/dL (ref 0.2–1.2)
Total Protein: 7.3 g/dL (ref 6.1–8.1)
eGFR: 97 mL/min/{1.73_m2} (ref >=60)

## 2022-12-16 LAB — HEMOGLOBIN A1C
Hgb A1c MFr Bld: 6.5 % of total Hgb — ABNORMAL HIGH (ref <5.7)
Mean Plasma Glucose: 140 mg/dL
eAG (mmol/L): 7.7 mmol/L

## 2022-12-28 ENCOUNTER — Other Ambulatory Visit: Payer: Self-pay | Admitting: Nurse Practitioner

## 2023-04-15 ENCOUNTER — Ambulatory Visit: Payer: BC Managed Care – PPO | Admitting: Nurse Practitioner

## 2023-04-27 ENCOUNTER — Ambulatory Visit: Payer: BC Managed Care – PPO | Admitting: Nurse Practitioner

## 2023-05-18 ENCOUNTER — Telehealth: Payer: Self-pay | Admitting: Nurse Practitioner

## 2023-05-18 ENCOUNTER — Telehealth: Payer: Self-pay

## 2023-05-18 DIAGNOSIS — E1165 Type 2 diabetes mellitus with hyperglycemia: Secondary | ICD-10-CM

## 2023-05-18 MED ORDER — GLIPIZIDE 5 MG PO TABS
ORAL_TABLET | ORAL | 1 refills | Status: DC
Start: 2023-05-18 — End: 2023-11-08

## 2023-05-18 NOTE — Telephone Encounter (Signed)
Refill on Glipizide. Pls send to [facility] in [place] on file

## 2023-05-18 NOTE — Telephone Encounter (Signed)
PA for Glipizide completed and submitted

## 2023-05-18 NOTE — Addendum Note (Signed)
Addended by: Dionicio Stall on: 05/18/2023 10:37 AM   Modules accepted: Orders

## 2023-06-14 ENCOUNTER — Other Ambulatory Visit: Payer: Self-pay | Admitting: Nurse Practitioner

## 2023-07-13 ENCOUNTER — Ambulatory Visit: Payer: BC Managed Care – PPO | Admitting: Nurse Practitioner

## 2023-07-13 ENCOUNTER — Encounter: Payer: Self-pay | Admitting: Nurse Practitioner

## 2023-07-13 VITALS — BP 132/88 | HR 71 | Temp 97.5°F | Ht 70.5 in | Wt 266.2 lb

## 2023-07-13 DIAGNOSIS — E785 Hyperlipidemia, unspecified: Secondary | ICD-10-CM

## 2023-07-13 DIAGNOSIS — L237 Allergic contact dermatitis due to plants, except food: Secondary | ICD-10-CM

## 2023-07-13 DIAGNOSIS — Z79899 Other long term (current) drug therapy: Secondary | ICD-10-CM

## 2023-07-13 DIAGNOSIS — I10 Essential (primary) hypertension: Secondary | ICD-10-CM | POA: Diagnosis not present

## 2023-07-13 DIAGNOSIS — G4733 Obstructive sleep apnea (adult) (pediatric): Secondary | ICD-10-CM

## 2023-07-13 DIAGNOSIS — E1122 Type 2 diabetes mellitus with diabetic chronic kidney disease: Secondary | ICD-10-CM

## 2023-07-13 DIAGNOSIS — E559 Vitamin D deficiency, unspecified: Secondary | ICD-10-CM

## 2023-07-13 DIAGNOSIS — E1169 Type 2 diabetes mellitus with other specified complication: Secondary | ICD-10-CM

## 2023-07-13 DIAGNOSIS — H6501 Acute serous otitis media, right ear: Secondary | ICD-10-CM

## 2023-07-13 DIAGNOSIS — N182 Chronic kidney disease, stage 2 (mild): Secondary | ICD-10-CM

## 2023-07-13 MED ORDER — AMOXICILLIN 500 MG PO TABS
500.0000 mg | ORAL_TABLET | Freq: Three times a day (TID) | ORAL | 0 refills | Status: DC
Start: 2023-07-13 — End: 2024-02-29

## 2023-07-13 NOTE — Patient Instructions (Signed)
Otitis Media, Adult  Otitis media is a condition in which the middle ear is red and swollen (inflamed) and full of fluid. The middle ear is the part of the ear that contains bones for hearing as well as air that helps send sounds to the brain. The condition usually goes away on its own. What are the causes? This condition is caused by a blockage in the eustachian tube. This tube connects the middle ear to the back of the nose. It normally allows air into the middle ear. The blockage is caused by fluid or swelling. Problems that can cause blockage include: A cold or infection that affects the nose, mouth, or throat. Allergies. An irritant, such as tobacco smoke. Adenoids that have become large. The adenoids are soft tissue located in the back of the throat, behind the nose and the roof of the mouth. Growth or swelling in the upper part of the throat, just behind the nose (nasopharynx). Damage to the ear caused by a change in pressure. This is called barotrauma. What increases the risk? You are more likely to develop this condition if you: Smoke or are exposed to tobacco smoke. Have an opening in the roof of your mouth (cleft palate). Have acid reflux. Have problems in your body's defense system (immune system). What are the signs or symptoms? Symptoms of this condition include: Ear pain. Fever. Problems with hearing. Being tired. Fluid leaking from the ear. Ringing in the ear. How is this treated? This condition can go away on its own within 3-5 days. But if the condition is caused by germs (bacteria) and does not go away on its own, or if it keeps coming back, your doctor may: Give you antibiotic medicines. Give you medicines for pain. Follow these instructions at home: Take over-the-counter and prescription medicines only as told by your doctor. If you were prescribed an antibiotic medicine, take it as told by your doctor. Do not stop taking it even if you start to feel better. Keep  all follow-up visits. Contact a doctor if: You have bleeding from your nose. There is a lump on your neck. You are not feeling better in 5 days. You feel worse instead of better. Get help right away if: You have pain that is not helped with medicine. You have swelling, redness, or pain around your ear. You get a stiff neck. You cannot move part of your face (paralysis). You notice that the bone behind your ear hurts when you touch it. You get a very bad headache. Summary Otitis media means that the middle ear is red, swollen, and full of fluid. This condition usually goes away on its own. If the problem does not go away, treatment may be needed. You may be given medicines to treat the infection or to treat your pain. If you were prescribed an antibiotic medicine, take it as told by your doctor. Do not stop taking it even if you start to feel better. Keep all follow-up visits. This information is not intended to replace advice given to you by your health care provider. Make sure you discuss any questions you have with your health care provider. Document Revised: 02/03/2021 Document Reviewed: 02/03/2021 Elsevier Patient Education  2024 [employer].

## 2023-07-13 NOTE — Progress Notes (Signed)
FOLLOW UP 4  MONTH  Assessment and Plan:  Larry Hudson was seen today for follow-up.  Diagnoses and all orders for this visit:  Essential hypertension No medications, lifestyle controlled Monitor blood pressure at home; call if consistently over 130/80 Continue DASH diet.   Reminder to go to the ER if any CP, SOB, nausea, dizziness, severe HA, changes vision/speech, left arm numbness and tingling and jaw pain. -     CBC with Differential/Platelet -     COMPLETE METABOLIC PANEL WITH GFR  Hyperlipidemia associated with type 2 diabetes mellitus (HCC) Continue medications: rosuvastatin 40mg  daily, has not take Zetia in past several years.  Will restart Zetia if LDL remains elevated Discussed dietary and exercise modifications Low fat diet -     Lipid panel  CKD stage 2 due to type 2 diabetes mellitus (HCC) Increase fluids  Avoid NSAIDS Blood pressure control Monitor sugars  Will continue to monitor  Type 2 diabetes mellitus with stage 2 chronic kidney disease, without long-term current use of insulin (HCC) Continue medications: Metformin 500mg  two tablets twice a day with food. Glipizide 5mg  taking this BID. Discussed general issues about diabetes pathophysiology and management. Education: Reviewed 'ABCs' of diabetes management (respective goals in parentheses):  A1C (<7), blood pressure (<130/80), and cholesterol (LDL <70) Dietary recommendations Encouraged aerobic exercise.  Discussed foot care, check daily Yearly retinal exam Dental exam every 6 months Monitor blood glucose, discussed goal for patient -     Hemoglobin A1c  OSA on CPAP Continue CPAP/BiPAP, using nightly for at least 8 hours  Helping with daytime fatigue Weight loss still advised Discussed mask & tubing hygeine  Vitamin D deficiency Continue supplementation to maintain goal of 70-100 Taking Vitamin D 5,000 IU daily Defer vitamin D level   Morbid obesity BMI (HCC)  Discussed dietary and exercise  modifications  Medication management -     CBC with Differential/Platelet -     COMPLETE METABOLIC PANEL WITH GFR -     Lipid panel -     Hemoglobin A1C w/out eAG   Right acute serous otitis media, recurrence not specified -     amoxicillin (AMOXIL) 500 MG tablet; Take 1 tablet (500 mg total) by mouth 3 (three) times daily. 10 days  Poison Ivy Declines oral steroids at this time, continue benadryl gel and if worsens notify the office    Discussed med's effects and SE's. Screening labs and tests as requested with regular follow-up as recommended. Over 30 minutes of face to face interview, exam, counseling, chart review and critical decision making was performed  Future Appointments  Date Time Provider Department Center  09/09/2023 10:00 AM Adela Glimpse, NP [facility] None     HPI Patient presents for a 3 month follow up.  He has Essential hypertension; Hyperlipidemia; Vitamin D deficiency; Abnormal glucose; Medication management; Morbid obesity BMI (HCC) ; OSA on CPAP; Labile hypertension; CKD stage 2 due to type 2 diabetes mellitus (HCC); Type 2 diabetes mellitus with stage 2 chronic kidney disease, without long-term current use of insulin (HCC); and Hyperlipidemia associated with type 2 diabetes mellitus (HCC) on their problem list.     He has been experiencing right aching ear pain for about 2 months. Denies sinus congestion and fevers.   He will get poison ivy when he push mows the grass. This will occur intermittently. Has patch on his left leg. He is using Benadryl gel.   OSA followed by Dr. Vickey Huger and had follow up to check his machine.  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   Previous left hip pain.  Today he reports his hips are doing well and he will only take this medication as needed.    BMI is Body mass index is 37.66 kg/m., he has been working on diet and exercise. Wt Readings from Last 3 Encounters:  07/13/23 266 lb 3.2 oz (120.7 kg)   12/15/22 268 lb 3.2 oz (121.7 kg)  09/08/22 268 lb 3.2 oz (121.7 kg)   His blood pressure has been controlled at home, today their BP is BP: 132/88  BP Readings from Last 3 Encounters:  07/13/23 132/88  12/15/22 (!) 140/82  09/08/22 (!) 148/98    He does workout (physically intense job).  He denies chest pain, shortness of breath, dizziness.   He is on cholesterol medication (Taking rosuvastatin 40 mg  has been off zetia) and denies myalgias. His cholesterol is not at goal. The cholesterol last visit was:   Lab Results  Component Value Date   CHOL 203 (H) 12/15/2022   HDL 30 (L) 12/15/2022   LDLCALC 129 (H) 12/15/2022   TRIG 285 (H) 12/15/2022   CHOLHDL 6.8 (H) 12/15/2022  He has been working on diet and exercise for diabetes He states he has been very strict with diet, he is using stevia rather than sugar, he is off cookies/icecream. He will occ eat sugar free ice cream. He is eating dave's killer bread if he does eat. He is increasing his veggies. He will occasionally eat chicken and broccoli from [employer] he works for but they put sugar in the sauce.   With CKD not on ACE- last kidney function at goal With hyperlipidemia not at goal of 70- on crestor 40 mg and Ezetimibe 10 mg QD  he is on bASA  he is not on ACE/ARB  He was started on glipizide 5 mg 3 x a day Metformin 500 mg1 tab with each meal 3 pills a day He is getting blood sugars running 90-120 and denies foot ulcerations, increased appetite, nausea, paresthesia of the feet, polydipsia, polyuria, visual disturbances, vomiting and weight loss.  Last A1C in the office was:  Lab Results  Component Value Date   HGBA1C 6.5 (H) 12/15/2022   Last GFR: Lab Results  Component Value Date   EGFR 97 12/15/2022     Patient is on Vitamin D supplement.   Lab Results  Component Value Date   VD25OH 67 09/08/2022        Current Medications:   Current Outpatient Medications (Endocrine & Metabolic):    glipiZIDE  (GLUCOTROL) 5 MG tablet, TAKE 1 TABLET BY MOUTH THREE TIMES DAILY WITH MEALS FOR DIABETES   metFORMIN (GLUCOPHAGE-XR) 500 MG 24 hr tablet, TAKE 1 TO 2 TABLETS BY MOUTH TWICE DAILY WITH MEALS FOR DIABETES  Current Outpatient Medications (Cardiovascular):    rosuvastatin (CRESTOR) 40 MG tablet, TAKE 1 TABLET BY MOUTH ONCE DAILY FOR CHOLESTEROL   ezetimibe (ZETIA) 10 MG tablet, Take 1 tablet (10 mg total) by mouth daily.   Current Outpatient Medications (Analgesics):    BABY ASPIRIN PO, Take 81 mg by mouth daily.   meloxicam (MOBIC) 15 MG tablet, Take  1/2 to 1 tablet  Daily  with Food as needed for Pain & Inflammation & try limit to 5 days /week to Avoid Permanent Kidney Damage                                                      /  Patient knows to take by mouth  !  Current Outpatient Medications (Hematological):    Cyanocobalamin (VITAMIN B-12 PO), Take by mouth daily.  Current Outpatient Medications (Other):    Cholecalciferol (VITAMIN D PO), Take 5,000 Int'l Units by mouth in the morning and at bedtime.    Multiple Vitamin (MULTIVITAMIN) tablet, Take 1 tablet by mouth daily.   Omega-3 Fatty Acids (FISH OIL PO), Take 1,200 mg by mouth daily.   zinc gluconate 50 MG tablet, Take 50 mg by mouth daily.  Allergies:  No Known Allergies   Health Maintenance:  Immunization History  Administered Date(s) Administered   Influenza Inj Mdck Quad With Preservative 10/10/2018   Influenza Split 10/08/2014, 08/21/2020   Influenza,inj,Quad PF,6+ Mos 09/08/2022   Influenza-Unspecified 08/07/2015   PFIZER Comirnaty(Gray Top)Covid-19 Tri-Sucrose Vaccine 04/28/2020   PFIZER(Purple Top)SARS-COV-2 Vaccination 04/07/2020   PPD Test 10/08/2014, 10/21/2015   Pfizer Covid-19 Vaccine Bivalent Booster 41yrs & up 11/30/2020   Tdap 08/14/2009   Health Maintenance  Topic Date Due   FOOT EXAM  Never done   OPHTHALMOLOGY EXAM  Never done   Zoster Vaccines- Shingrix (1 of 2) Never  done   DTaP/Tdap/Td (2 - Td or Tdap) 08/15/2019   Colonoscopy  11/25/2021   INFLUENZA VACCINE  06/10/2023   COVID-19 Vaccine (4 - 2023-24 season) 07/11/2023   HEMOGLOBIN A1C  06/15/2023   Diabetic kidney evaluation - Urine ACR  09/09/2023   Diabetic kidney evaluation - eGFR measurement  12/16/2023   Hepatitis C Screening  Completed   HIV Screening  Completed   HPV VACCINES  Aged Out   Health Maintenance  Topic Date Due   FOOT EXAM  Never done   OPHTHALMOLOGY EXAM  Never done   Zoster Vaccines- Shingrix (1 of 2) Never done   DTaP/Tdap/Td (2 - Td or Tdap) 08/15/2019   Colonoscopy  11/25/2021   INFLUENZA VACCINE  06/10/2023   COVID-19 Vaccine (4 - 2023-24 season) 07/11/2023   HEMOGLOBIN A1C  06/15/2023   Diabetic kidney evaluation - Urine ACR  09/09/2023   Diabetic kidney evaluation - eGFR measurement  12/16/2023   Hepatitis C Screening  Completed   HIV Screening  Completed   HPV VACCINES  Aged Out     Eye Exam: [place], last 2-3 years ago, but has scheduled for this year Dentist: Last several years ago, has new insurance, will schedule appointment ASAP  Patient Care Team: Lucky Cowboy, MD as PCP - General (Internal Medicine) Sharrell Ku, MD as Consulting Physician (Gastroenterology)  Medical History:  has Essential hypertension; Hyperlipidemia; Vitamin D deficiency; Abnormal glucose; Medication management; Morbid obesity BMI (HCC) ; OSA on CPAP; Labile hypertension; CKD stage 2 due to type 2 diabetes mellitus (HCC); Type 2 diabetes mellitus with stage 2 chronic kidney disease, without long-term current use of insulin (HCC); and Hyperlipidemia associated with type 2 diabetes mellitus (HCC) on their problem list. Surgical History:  He  has a past surgical history that includes Hemorrhoid banding (2013) and Nasal septum surgery (2004). Family History:  His family history includes Emphysema in his father; Heart attack in his mother; Heart disease in his father;  Hyperlipidemia in his mother. Social History:   reports that he quit smoking about 27 years ago. His smoking use included cigarettes. He has never used smokeless tobacco. He reports that he does not drink alcohol and does not use drugs. Review of Systems:  Review of Systems  Constitutional:  Negative for malaise/fatigue and weight loss.  HENT:  Positive for ear  pain (right). Negative for hearing loss and tinnitus.   Eyes:  Negative for blurred vision and double vision.  Respiratory:  Negative for cough, shortness of breath and wheezing.   Cardiovascular:  Positive for PND. Negative for chest pain, palpitations, orthopnea, claudication and leg swelling.  Gastrointestinal:  Negative for abdominal pain, blood in stool, constipation, diarrhea, heartburn, melena, nausea and vomiting.  Genitourinary: Negative.   Musculoskeletal:  Negative for joint pain and myalgias.  Skin:  Positive for rash (poison ivy left thigh).  Neurological:  Negative for dizziness, tingling, sensory change, weakness and headaches.  Endo/Heme/Allergies:  Negative for polydipsia.  Psychiatric/Behavioral: Negative.    All other systems reviewed and are negative.   Physical Exam: Estimated body mass index is 37.66 kg/m as calculated from the following:   Height as of this encounter: 5' 10.5" (1.791 m).   Weight as of this encounter: 266 lb 3.2 oz (120.7 kg). BP 132/88   Pulse 71   Temp (!) 97.5 F (36.4 C)   Ht 5' 10.5" (1.791 m)   Wt 266 lb 3.2 oz (120.7 kg)   SpO2 97%   BMI 37.66 kg/m  General Appearance: Well nourished, in no apparent distress.  Eyes: PERRLA, EOMs, conjunctiva no swelling or erythema, normal fundi and vessels.  Sinuses: No Frontal/maxillary tenderness  ENT/Mouth: Ext aud canals clear, normal light reflex LTM without erythema, bulging. R TM erythema with serous drainage.  No erythema, swelling, or exudate on post pharynx. Hearing normal.  Neck: Supple, thyroid normal. No bruits  Respiratory:  Respiratory effort normal, BS equal bilaterally without rales, rhonchi, wheezing or stridor.  Cardio: RRR without murmurs, rubs or gallops. Brisk peripheral pulses without edema.  Chest: symmetric, with normal excursions and percussion.  Abdomen: Soft, nontender, no guarding, rebound, hernias, masses, or organomegaly.  Lymphatics: Non tender without lymphadenopathy.  Musculoskeletal: Full ROM all peripheral extremities,5/5 strength, and normal gait.  Skin: Warm, dry. Poison ivy rash on left thigh Neuro: Cranial nerves intact, reflexes equal bilaterally. Normal muscle tone, no cerebellar symptoms. Sensation intact.  Psych: Awake and oriented X 3, normal affect, Insight and Judgment appropriate.     [name] ANP-C  [facility]  07/13/2023

## 2023-07-14 ENCOUNTER — Other Ambulatory Visit: Payer: Self-pay | Admitting: Nurse Practitioner

## 2023-07-14 DIAGNOSIS — E1169 Type 2 diabetes mellitus with other specified complication: Secondary | ICD-10-CM

## 2023-07-14 LAB — COMPLETE METABOLIC PANEL WITH GFR
AG Ratio: 1.7 (calc) (ref 1.0–2.5)
ALT: 35 U/L (ref 9–46)
AST: 27 U/L (ref 10–35)
Albumin: 4.4 g/dL (ref 3.6–5.1)
Alkaline phosphatase (APISO): 54 U/L (ref 35–144)
BUN: 18 mg/dL (ref 7–25)
CO2: 24 mmol/L (ref 20–32)
Calcium: 9.5 mg/dL (ref 8.6–10.3)
Chloride: 106 mmol/L (ref 98–110)
Creat: 0.98 mg/dL (ref 0.70–1.35)
Globulin: 2.6 g/dL (ref 1.9–3.7)
Glucose, Bld: 134 mg/dL — ABNORMAL HIGH (ref 65–99)
Potassium: 4.4 mmol/L (ref 3.5–5.3)
Sodium: 140 mmol/L (ref 135–146)
Total Bilirubin: 1.3 mg/dL — ABNORMAL HIGH (ref 0.2–1.2)
Total Protein: 7 g/dL (ref 6.1–8.1)
eGFR: 87 mL/min/{1.73_m2} (ref >=60)

## 2023-07-14 LAB — CBC WITH DIFFERENTIAL/PLATELET
Absolute Monocytes: 436 {cells}/uL (ref 200–950)
Basophils Absolute: 39 {cells}/uL (ref 0–200)
Basophils Relative: 0.8 %
Eosinophils Absolute: 118 {cells}/uL (ref 15–500)
Eosinophils Relative: 2.4 %
HCT: 42.2 % (ref 38.5–50.0)
Hemoglobin: 14.1 g/dL (ref 13.2–17.1)
Lymphs Abs: 1774 {cells}/uL (ref 850–3900)
MCH: 28 pg (ref 27.0–33.0)
MCHC: 33.4 g/dL (ref 32.0–36.0)
MCV: 83.7 fL (ref 80.0–100.0)
MPV: 11.6 fL (ref 7.5–12.5)
Monocytes Relative: 8.9 %
Neutro Abs: 2533 {cells}/uL (ref 1500–7800)
Neutrophils Relative %: 51.7 %
Platelets: 216 10*3/uL (ref 140–400)
RBC: 5.04 10*6/uL (ref 4.20–5.80)
RDW: 13.3 % (ref 11.0–15.0)
Total Lymphocyte: 36.2 %
WBC: 4.9 10*3/uL (ref 3.8–10.8)

## 2023-07-14 LAB — LIPID PANEL
Cholesterol: 186 mg/dL (ref <200)
HDL: 30 mg/dL — ABNORMAL LOW (ref >=40)
LDL Cholesterol (Calc): 120 mg/dL — ABNORMAL HIGH
Non-HDL Cholesterol (Calc): 156 mg/dL — ABNORMAL HIGH (ref <130)
Total CHOL/HDL Ratio: 6.2 (calc) — ABNORMAL HIGH (ref <5.0)
Triglycerides: 251 mg/dL — ABNORMAL HIGH (ref <150)

## 2023-07-14 LAB — HEMOGLOBIN A1C W/OUT EAG: Hgb A1c MFr Bld: 6.6 %{Hb} — ABNORMAL HIGH (ref <5.7)

## 2023-07-14 MED ORDER — EZETIMIBE 10 MG PO TABS
10.0000 mg | ORAL_TABLET | Freq: Every day | ORAL | 3 refills | Status: DC
Start: 2023-07-14 — End: 2024-08-08

## 2023-09-09 ENCOUNTER — Encounter: Payer: BC Managed Care – PPO | Admitting: Nurse Practitioner

## 2023-09-15 ENCOUNTER — Other Ambulatory Visit: Payer: Self-pay | Admitting: Nurse Practitioner

## 2023-10-12 ENCOUNTER — Encounter: Payer: BC Managed Care – PPO | Admitting: Nurse Practitioner

## 2023-10-13 ENCOUNTER — Other Ambulatory Visit: Payer: Self-pay | Admitting: Internal Medicine

## 2023-10-13 DIAGNOSIS — N182 Chronic kidney disease, stage 2 (mild): Secondary | ICD-10-CM

## 2023-10-13 DIAGNOSIS — M150 Primary generalized (osteo)arthritis: Secondary | ICD-10-CM

## 2023-10-13 MED ORDER — MELOXICAM 15 MG PO TABS: ORAL_TABLET | ORAL | 3 refills | Status: DC

## 2023-10-13 MED ORDER — METFORMIN HCL ER 500 MG PO TB24
ORAL_TABLET | ORAL | 3 refills | Status: DC
Start: 2023-10-13 — End: 2024-02-22

## 2023-11-08 ENCOUNTER — Other Ambulatory Visit: Payer: Self-pay | Admitting: Nurse Practitioner

## 2023-11-08 DIAGNOSIS — E1165 Type 2 diabetes mellitus with hyperglycemia: Secondary | ICD-10-CM

## 2023-12-15 ENCOUNTER — Encounter: Payer: BC Managed Care – PPO | Admitting: Nurse Practitioner

## 2023-12-21 ENCOUNTER — Encounter: Payer: Self-pay | Admitting: *Deleted

## 2023-12-27 ENCOUNTER — Other Ambulatory Visit: Payer: Self-pay

## 2023-12-27 MED ORDER — ROSUVASTATIN CALCIUM 40 MG PO TABS: ORAL_TABLET | ORAL | 0 refills | Status: DC

## 2024-01-11 ENCOUNTER — Encounter: Payer: BC Managed Care – PPO | Admitting: Nurse Practitioner

## 2024-01-18 ENCOUNTER — Encounter: Payer: BC Managed Care – PPO | Admitting: Nurse Practitioner

## 2024-02-22 ENCOUNTER — Other Ambulatory Visit: Payer: Self-pay | Admitting: Internal Medicine

## 2024-02-22 DIAGNOSIS — E1122 Type 2 diabetes mellitus with diabetic chronic kidney disease: Secondary | ICD-10-CM

## 2024-02-22 MED ORDER — METFORMIN HCL ER 500 MG PO TB24: ORAL_TABLET | ORAL | 1 refills | Status: DC

## 2024-02-22 NOTE — Telephone Encounter (Signed)
 Copied from CRM [id number]. Topic: Clinical - Medication Refill >> Feb 22, 2024 12:03 PM Yolanda T wrote: Most Recent Primary Care Visit:   Medication: metFORMIN (GLUCOPHAGE-XR) 500 MG 24 hr tablet  Has the patient contacted their pharmacy? No  Is this the correct pharmacy for this prescription? Yes If no, delete pharmacy and type the correct one.  This is the patient's preferred pharmacy:  [id number] - [place], [place] - [address] [place] [address] Phone: [phone] Fax: [phone]  Has the prescription been filled recently? Yes  Is the patient out of the medication? Yes  Has the patient been seen for an appointment in the last year OR does the patient have an upcoming appointment? Yes  Can we respond through MyChart? No  Agent: Please be advised that Rx refills may take up to 3 business days. We ask that you follow-up with your pharmacy.  Patient has an appt on 4/22 but will run out of medication before then

## 2024-02-29 ENCOUNTER — Encounter (HOSPITAL_BASED_OUTPATIENT_CLINIC_OR_DEPARTMENT_OTHER): Payer: Self-pay | Admitting: Family Medicine

## 2024-02-29 ENCOUNTER — Ambulatory Visit (HOSPITAL_BASED_OUTPATIENT_CLINIC_OR_DEPARTMENT_OTHER): Payer: Self-pay | Admitting: Family Medicine

## 2024-02-29 VITALS — BP 140/82 | HR 69 | Ht 70.5 in | Wt 265.0 lb

## 2024-02-29 DIAGNOSIS — E1122 Type 2 diabetes mellitus with diabetic chronic kidney disease: Secondary | ICD-10-CM

## 2024-02-29 DIAGNOSIS — Z1211 Encounter for screening for malignant neoplasm of colon: Secondary | ICD-10-CM

## 2024-02-29 DIAGNOSIS — E782 Mixed hyperlipidemia: Secondary | ICD-10-CM

## 2024-02-29 DIAGNOSIS — I10 Essential (primary) hypertension: Secondary | ICD-10-CM

## 2024-02-29 DIAGNOSIS — Z7984 Long term (current) use of oral hypoglycemic drugs: Secondary | ICD-10-CM

## 2024-02-29 DIAGNOSIS — N182 Chronic kidney disease, stage 2 (mild): Secondary | ICD-10-CM

## 2024-02-29 LAB — POCT UA - MICROALBUMIN
Albumin/Creatinine Ratio, Urine, POC: <30
Creatinine, POC: 100 mg/dL
Microalbumin Ur, POC: 10 mg/L

## 2024-02-29 MED ORDER — ROSUVASTATIN CALCIUM 40 MG PO TABS: ORAL_TABLET | ORAL | 3 refills | Status: AC

## 2024-02-29 MED ORDER — METFORMIN HCL ER 500 MG PO TB24: 1000.0000 mg | ORAL_TABLET | Freq: Two times a day (BID) | ORAL | 1 refills | Status: DC

## 2024-02-29 NOTE — Progress Notes (Signed)
 New Patient Office Visit  Subjective:   Larry Hudson 10-Aug-1961 02/29/2024  Chief Complaint  Patient presents with   New Patient (Initial Visit)    Patient is here today to get established with the practice.  Is here to mainly get established with the practice after prior PCP unexpectedly passed away.    HPI: Larry Hudson presents today to establish care at [facility] at [facility]. Introduced to Publishing rights manager role and practice setting.  All questions answered.   Last PCP: Dr. Alica Antu Last annual physical: 2023 Concerns: See below   HYPERTENSION: Larry Hudson presents for the medical management of hypertension.  Patient's current hypertension medication regimen is: Diet, exercise controlled. Patient monitors well. He has a hx of white coat hypertension.  Patient is not currently taking prescribed medications for HTN.  Patient is  regularly keeping a check on BP at home.  Adhering to low sodium diet: Yes Exercising Regularly: Yes Denies headache, dizziness, CP, SHOB, vision changes.   BP Readings from Last 3 Encounters:  02/29/24 (!) 140/82  07/13/23 132/88  12/15/22 (!) 140/82    DIABETES MELLITUS: Larry Hudson presents for the medical management of diabetes.  Current diabetes medication regimen: Metformin  and Glipizide   Patient is  adhering to a diabetic diet.  Patient is  exercising regularly.  Patient is  checking BS regularly. Avg: 80-140 Patient is  checking their feet regularly.  Denies polydipsia, polyphagia, polyuria, open wounds or ulcers on feet.   Recommended to get eyes checked with Ophthalmology.   Lab Results  Component Value Date   HGBA1C 6.6 (H) 07/13/2023    Foot Exam: 02/29/2024 Lab Results  Component Value Date   MICROALBUR 10 02/29/2024   MICROALBUR 2.3 09/08/2022    Wt Readings from Last 3 Encounters:  02/29/24 265 lb (120.2 kg)  07/13/23 266 lb 3.2 oz (120.7 kg)  12/15/22 268 lb 3.2 oz  (121.7 kg)    HYPERLIPIDEMIA: Larry Hudson presents for the medical management of hyperlipidemia.  Patient's current HLD regimen is: Rosuvastatin  40mg  (previously taking Zetia )  Patient is  currently taking prescribed medications for HLD.  Adhering to heathy diet: Yes Exercising regularly: Yes Denies myalgias.  The 10-year ASCVD risk score (Arnett DK, et al., 2019) is: 27.6%  Lab Results  Component Value Date   CHOL 186 07/13/2023   HDL 30 (L) 07/13/2023   LDLCALC 120 (H) 07/13/2023   TRIG 251 (H) 07/13/2023   CHOLHDL 6.2 (H) 07/13/2023      The following portions of the patient's history were reviewed and updated as appropriate: past medical history, past surgical history, family history, social history, allergies, medications, and problem list.   Patient Active Problem List   Diagnosis Date Noted   CKD stage 2 due to type 2 diabetes mellitus (HCC) 04/22/2020   Type 2 diabetes mellitus with stage 2 chronic kidney disease, without long-term current use of insulin  (HCC) 04/22/2020   Hyperlipidemia associated with type 2 diabetes mellitus (HCC) 04/22/2020   Labile hypertension 02/12/2020   Morbid obesity BMI (HCC)  06/03/2015   OSA on CPAP 06/03/2015   Medication management 08/10/2014   Essential hypertension 06/26/2014   Hyperlipidemia 06/26/2014   Vitamin D  deficiency 06/26/2014   Abnormal glucose 06/26/2014   Past Medical History:  Diagnosis Date   Diabetes mellitus without complication (HCC) 12/2008   A1c >14+%   Hyperlipidemia    Hypertension    Prediabetes    Vitamin D  deficiency  Past Surgical History:  Procedure Laterality Date   HEMORRHOID BANDING  2013   NASAL SEPTUM SURGERY  2004   Family History  Problem Relation Age of Onset   Hyperlipidemia Mother    Heart attack Mother    Heart disease Father    Emphysema Father    Social History   Socioeconomic History   Marital status: Married    Spouse name: Not on file   Number of children: Not on  file   Years of education: Not on file   Highest education level: Not on file  Occupational History   Not on file  Tobacco Use   Smoking status: Former    Current packs/day: 0.00    Types: Cigarettes    Quit date: 02/03/1996    Years since quitting: 28.0   Smokeless tobacco: Never  Vaping Use   Vaping status: Never Used  Substance and Sexual Activity   Alcohol use: No    Alcohol/week: 0.0 standard drinks of alcohol   Drug use: No   Sexual activity: Not on file  Other Topics Concern   Not on file  Social History Narrative   Not on file   Social Drivers of Health   Financial Resource Strain: Not on file  Food Insecurity: Not on file  Transportation Needs: Not on file  Physical Activity: Not on file  Stress: Not on file  Social Connections: Not on file  Intimate Partner Violence: Not on file   Outpatient Medications Prior to Visit  Medication Sig Dispense Refill   BABY ASPIRIN PO Take 81 mg by mouth daily.     Cholecalciferol (VITAMIN D  PO) Take 5,000 Int'l Units by mouth in the morning and at bedtime.      Cyanocobalamin (VITAMIN B-12 PO) Take by mouth daily.     ezetimibe  (ZETIA ) 10 MG tablet Take 1 tablet (10 mg total) by mouth daily. 90 tablet 3   glipiZIDE  (GLUCOTROL ) 5 MG tablet TAKE 1 TABLET BY MOUTH THREE TIMES DAILY WITH MEALS FOR DIABETES 270 tablet 0   meloxicam  (MOBIC ) 15 MG tablet Take  1/2 to 1 tablet  Daily  with Food as needed for Pain & Inflammation & try limit to 5 days /week to Avoid Permanent Kidney Damage                                        /                                                                   TAKE                                         BY                                                 MOUTH 90 tablet 3   Multiple Vitamin (MULTIVITAMIN) tablet Take 1 tablet by mouth daily.  Omega-3 Fatty Acids (FISH OIL PO) Take 1,200 mg by mouth daily.     zinc gluconate 50 MG tablet Take 50 mg by mouth daily.     metFORMIN  (GLUCOPHAGE -XR) 500 MG 24  hr tablet Take 2 tablets 2 x day with meals for Diabetes 360 tablet 1   rosuvastatin  (CRESTOR ) 40 MG tablet TAKE 1 TABLET BY MOUTH ONCE DAILY FOR CHOLESTEROL 90 tablet 0   amoxicillin  (AMOXIL ) 500 MG tablet Take 1 tablet (500 mg total) by mouth 3 (three) times daily. 10 days 30 tablet 0   No facility-administered medications prior to visit.   No Known Allergies  ROS: A complete ROS was performed with pertinent positives/negatives noted in the HPI. The remainder of the ROS are negative.   Objective:   Today's Vitals   02/29/24 1302 02/29/24 1411  BP: (!) 164/88 (!) 140/82  Pulse: 69   SpO2: 99%   Weight: 265 lb (120.2 kg)   Height: 5' 10.5" (1.791 m)     GENERAL: Well-appearing, in NAD. Well nourished.  SKIN: Pink, warm and dry.  Head: Normocephalic. NECK: Trachea midline. Full ROM w/o pain or tenderness.  RESPIRATORY: Chest wall symmetrical. Respirations even and non-labored. Breath sounds clear to auscultation bilaterally.  CARDIAC: S1, S2 present, regular rate and rhythm without murmur or gallops. Peripheral pulses 2+ bilaterally.  MSK: Muscle tone and strength appropriate for age.  NEUROLOGIC: No motor or sensory deficits. Steady, even gait. C2-C12 intact.  PSYCH/MENTAL STATUS: Alert, oriented x 3. Cooperative, appropriate mood and affect.   Diabetic Foot Exam - Simple   Simple Foot Form Diabetic Foot exam was performed with the following findings: Yes 02/29/2024  2:11 PM  Visual Inspection No deformities, no ulcerations, no other skin breakdown bilaterally: Yes Sensation Testing Intact to touch and monofilament testing bilaterally: Yes Pulse Check Posterior Tibialis and Dorsalis pulse intact bilaterally: Yes Comments      Health Maintenance Due  Topic Date Due   OPHTHALMOLOGY EXAM  Never done   Pneumococcal Vaccine 39-15 Years old (1 of 2 - PCV) Never done   Zoster Vaccines- Shingrix (1 of 2) Never done   DTaP/Tdap/Td (2 - Td or Tdap) 08/15/2019   Colonoscopy   11/25/2021   HEMOGLOBIN A1C  01/10/2024    Results for orders placed or performed in visit on 02/29/24  POCT UA - Microalbumin  Result Value Ref Range   Microalbumin Ur, POC 10 mg/L   Creatinine, POC 100 mg/dL   Albumin/Creatinine Ratio, Urine, POC <30        Assessment & Plan:  1. Type 2 diabetes mellitus with stage 2 chronic kidney disease, without long-term current use of insulin  (HCC) (Primary) Well controlled per chart review. Will return for fasting lab work to evaluate A1C. Urine albumin and foot exam completed today.  - Ambulatory referral to Ophthalmology - metFORMIN  (GLUCOPHAGE -XR) 500 MG 24 hr tablet; Take 2 tablets (1,000 mg total) by mouth 2 (two) times daily with a meal.  Dispense: 360 tablet; Refill: 1 - POCT UA - Microalbumin - Comprehensive metabolic panel with GFR; Future - Hemoglobin A1c; Future  2. Screening for colon cancer - Ambulatory referral to Gastroenterology  3. Hyperlipidemia Discussed improvement of triglycerides with patient and dietary changes. Will check LP with fasting labs and titrate medication if needed.  - Lipid panel; Future  4. Essential hypertension Controlled without medication w/ hx of white coat hypertension. Recommend patient obtain BP cuff and monitor regularly at home.       Patient  to reach out to office if new, worrisome, or unresolved symptoms arise or if no improvement in patient's condition. Patient verbalized understanding and is agreeable to treatment plan. All questions answered to patient's satisfaction.    Return in about 6 months (around 08/30/2024) for ANNUAL PHYSICAL, DIABETES CHECK UP.    Nonda Bays, [place]

## 2024-02-29 NOTE — Patient Instructions (Signed)
 Ophthalmology Offices   [facility]  [address]  [place], [place] [address] [phone]  [facility] Ophthalmology [address] [place], [place] [address] Phone: [phone]  [facility] [address] [place], [place] [address] Phone: [phone]  [facility]  Optometrist [address]  [phone]  [facility] Optometrist [address]  [phone]

## 2024-03-14 ENCOUNTER — Other Ambulatory Visit (HOSPITAL_COMMUNITY): Payer: Self-pay | Admitting: Family Medicine

## 2024-03-14 ENCOUNTER — Other Ambulatory Visit (HOSPITAL_BASED_OUTPATIENT_CLINIC_OR_DEPARTMENT_OTHER): Payer: Self-pay | Admitting: *Deleted

## 2024-03-14 DIAGNOSIS — N182 Chronic kidney disease, stage 2 (mild): Secondary | ICD-10-CM

## 2024-03-14 DIAGNOSIS — E782 Mixed hyperlipidemia: Secondary | ICD-10-CM

## 2024-03-15 LAB — COMPREHENSIVE METABOLIC PANEL WITH GFR
ALT: 31 IU/L (ref 0–44)
AST: 29 IU/L (ref 0–40)
Albumin: 4.6 g/dL (ref 3.9–4.9)
Alkaline Phosphatase: 66 IU/L (ref 44–121)
BUN/Creatinine Ratio: 15 (ref 10–24)
BUN: 13 mg/dL (ref 8–27)
Bilirubin Total: 1.1 mg/dL (ref 0.0–1.2)
CO2: 21 mmol/L (ref 20–29)
Calcium: 9.3 mg/dL (ref 8.6–10.2)
Chloride: 105 mmol/L (ref 96–106)
Creatinine, Ser: 0.89 mg/dL (ref 0.76–1.27)
Globulin, Total: 2.1 g/dL (ref 1.5–4.5)
Glucose: 132 mg/dL — ABNORMAL HIGH (ref 70–99)
Potassium: 4.2 mmol/L (ref 3.5–5.2)
Sodium: 140 mmol/L (ref 134–144)
Total Protein: 6.7 g/dL (ref 6.0–8.5)
eGFR: 96 mL/min/{1.73_m2} (ref >59)

## 2024-03-15 LAB — LIPID PANEL
Chol/HDL Ratio: 7.7 ratio — ABNORMAL HIGH (ref 0.0–5.0)
Cholesterol, Total: 200 mg/dL — ABNORMAL HIGH (ref 100–199)
HDL: 26 mg/dL — ABNORMAL LOW (ref >39)
LDL Chol Calc (NIH): 120 mg/dL — ABNORMAL HIGH (ref 0–99)
Triglycerides: 308 mg/dL — ABNORMAL HIGH (ref 0–149)
VLDL Cholesterol Cal: 54 mg/dL — ABNORMAL HIGH (ref 5–40)

## 2024-03-15 LAB — HEMOGLOBIN A1C
Est. average glucose Bld gHb Est-mCnc: 157 mg/dL
Hgb A1c MFr Bld: 7.1 % — ABNORMAL HIGH (ref 4.8–5.6)

## 2024-03-16 ENCOUNTER — Encounter (HOSPITAL_BASED_OUTPATIENT_CLINIC_OR_DEPARTMENT_OTHER): Payer: Self-pay | Admitting: Family Medicine

## 2024-03-16 NOTE — Progress Notes (Signed)
 Hi Larry Hudson,  Your electrolytes, kidney, and liver function is stable.  Unfortunately, your A1c has increased from 6.6 up to 7.1.  Our goal is to keep your A1c under 7.  With your current medications, we could make adjustments with adding on a medication such as Ozempic, Rybelsus, or Mounjaro.  If you are interested in trying these medications please let me know.  Adjusting diet and regular exercise will also help to lower your blood sugar.  Regarding your cholesterol, your triglycerides likely have increased due to the increase in your blood sugar.  Your total cholesterol and LDL are still uncontrolled.  With being on Crestor  and Zetia  without control of cholesterol, I would recommend establishing with the lipid clinic with [facility] to discuss further therapies to lower your risk of heart disease, heart attack and stroke.  If you are agreeable and or want to have a further discussion regarding these things, please let me know and we can speak over the phone.

## 2024-03-21 ENCOUNTER — Ambulatory Visit (HOSPITAL_BASED_OUTPATIENT_CLINIC_OR_DEPARTMENT_OTHER): Payer: Self-pay | Admitting: Family Medicine

## 2024-03-28 ENCOUNTER — Encounter (HOSPITAL_BASED_OUTPATIENT_CLINIC_OR_DEPARTMENT_OTHER): Payer: Self-pay | Admitting: *Deleted

## 2024-04-11 NOTE — Telephone Encounter (Signed)
 Copied from CRM [id number]. Topic: Clinical - Lab/Test Results >> Apr 11, 2024  1:43 PM Elle L wrote: Reason for CRM: The patient received a letter regarding his lab results. I relayed the note verbatim and he expressed understanding. However, he advised that he reviewed them previously on MyChart. He is interested in trying a medication such as Ozempic, Ryblesus, or Mounjaro. However, he did want to see if he would have to stop his other medications. He is also agreeable to establishing with the lipid clinic. The patient's call back number is [phone].

## 2024-04-13 ENCOUNTER — Encounter (HOSPITAL_BASED_OUTPATIENT_CLINIC_OR_DEPARTMENT_OTHER): Payer: Self-pay | Admitting: Family Medicine

## 2024-04-13 ENCOUNTER — Other Ambulatory Visit (HOSPITAL_BASED_OUTPATIENT_CLINIC_OR_DEPARTMENT_OTHER): Payer: Self-pay | Admitting: Family Medicine

## 2024-04-13 DIAGNOSIS — E782 Mixed hyperlipidemia: Secondary | ICD-10-CM

## 2024-04-13 MED ORDER — TIRZEPATIDE 2.5 MG/0.5ML ~~LOC~~ SOAJ: 2.5000 mg | SUBCUTANEOUS | 2 refills | Status: DC

## 2024-04-18 ENCOUNTER — Encounter (HOSPITAL_BASED_OUTPATIENT_CLINIC_OR_DEPARTMENT_OTHER): Payer: Self-pay | Admitting: Family Medicine

## 2024-04-18 ENCOUNTER — Ambulatory Visit (INDEPENDENT_AMBULATORY_CARE_PROVIDER_SITE_OTHER): Admitting: Family Medicine

## 2024-04-18 VITALS — BP 150/84 | HR 72 | Ht 70.5 in | Wt 266.8 lb

## 2024-04-18 DIAGNOSIS — L237 Allergic contact dermatitis due to plants, except food: Secondary | ICD-10-CM | POA: Diagnosis not present

## 2024-04-18 MED ORDER — CLOBETASOL PROPIONATE 0.05 % EX CREA: 1.0000 | TOPICAL_CREAM | Freq: Two times a day (BID) | CUTANEOUS | 0 refills | Status: DC

## 2024-04-18 NOTE — Progress Notes (Signed)
 Acute Care Office Visit  Subjective:   Larry Hudson 02-Dec-1960 04/18/2024  Chief Complaint  Patient presents with   Poison Ivy    Patient has had poison ivy for about a week and has tried various OTC medications and nothing is helping it go away.    HPI: RASH: Onset: 2wks while performing yard work  Location: bilateral forearms, bilateral lower legs, and left groin area     Course of rash: persistent, itchy  Treatments tried: Benadryl cream, baking soda, Clorox bleach    New medications/antibiotics: no  Tick/insect/pet exposure: no  Recent travel: no  New detergent, new clothing, or other topical exposure: no   Pruritis: yes  Tenderness: no  Feeling ill: no  Fever: no  Mouth lesions: no  Facial/tongue swelling/difficulty breathing:  no  Diabetic or immunocompromised: no     The following portions of the patient's history were reviewed and updated as appropriate: past medical history, past surgical history, family history, social history, allergies, medications, and problem list.   Patient Active Problem List   Diagnosis Date Noted   CKD stage 2 due to type 2 diabetes mellitus (HCC) 04/22/2020   Type 2 diabetes mellitus with stage 2 chronic kidney disease, without long-term current use of insulin  (HCC) 04/22/2020   Hyperlipidemia associated with type 2 diabetes mellitus (HCC) 04/22/2020   Labile hypertension 02/12/2020   Morbid obesity BMI (HCC)  06/03/2015   OSA on CPAP 06/03/2015   Medication management 08/10/2014   Essential hypertension 06/26/2014   Hyperlipidemia 06/26/2014   Vitamin D  deficiency 06/26/2014   Abnormal glucose 06/26/2014   Past Medical History:  Diagnosis Date   Diabetes mellitus without complication (HCC) 12/2008   A1c >14+%   Hyperlipidemia    Hypertension    Prediabetes    Vitamin D  deficiency    Past Surgical History:  Procedure Laterality Date   HEMORRHOID BANDING  2013   NASAL SEPTUM SURGERY  2004   Family History   Problem Relation Age of Onset   Hyperlipidemia Mother    Heart attack Mother    Heart disease Father    Emphysema Father    Outpatient Medications Prior to Visit  Medication Sig Dispense Refill   BABY ASPIRIN PO Take 81 mg by mouth daily.     Cholecalciferol (VITAMIN D  PO) Take 5,000 Int'l Units by mouth in the morning and at bedtime.      Cyanocobalamin (VITAMIN B-12 PO) Take by mouth daily.     ezetimibe  (ZETIA ) 10 MG tablet Take 1 tablet (10 mg total) by mouth daily. 90 tablet 3   glipiZIDE  (GLUCOTROL ) 5 MG tablet TAKE 1 TABLET BY MOUTH THREE TIMES DAILY WITH MEALS FOR DIABETES 270 tablet 0   meloxicam  (MOBIC ) 15 MG tablet Take  1/2 to 1 tablet  Daily  with Food as needed for Pain & Inflammation & try limit to 5 days /week to Avoid Permanent Kidney Damage                                        /  TAKE                                         BY                                                 MOUTH 90 tablet 3   metFORMIN  (GLUCOPHAGE -XR) 500 MG 24 hr tablet Take 2 tablets (1,000 mg total) by mouth 2 (two) times daily with a meal. 360 tablet 1   Multiple Vitamin (MULTIVITAMIN) tablet Take 1 tablet by mouth daily.     Omega-3 Fatty Acids (FISH OIL PO) Take 1,200 mg by mouth daily.     zinc gluconate 50 MG tablet Take 50 mg by mouth daily.     rosuvastatin  (CRESTOR ) 40 MG tablet TAKE 1 TABLET BY MOUTH ONCE DAILY FOR CHOLESTEROL (Patient not taking: Reported on 04/18/2024) 90 tablet 3   tirzepatide  (MOUNJARO ) 2.5 MG/0.5ML Pen Inject 2.5 mg into the skin once a week. (Patient not taking: Reported on 04/18/2024) 2 mL 2   No facility-administered medications prior to visit.   No Known Allergies   ROS: A complete ROS was performed with pertinent positives/negatives noted in the HPI. The remainder of the ROS are negative.    Objective:   Today's Vitals   04/18/24 0912 04/18/24 0951  BP: (!) 163/87 (!) 150/84  Pulse: 72   SpO2:  97%   Weight: 266 lb 12.8 oz (121 kg)   Height: 5' 10.5" (1.791 m)     GENERAL: Well-appearing, in NAD. Well nourished.  SKIN: Pink, warm and dry. Multiple areas of raised red rash with vesicles noted to left forearm, right AC, left anterior shin. No drainage, ecchymoses.  Head: Normocephalic. NECK: Trachea midline.  EYES: Conjunctiva clear without exudates. EOMI, PERRL, no drainage present.  THROAT: Uvula midline. Mucous membranes pink and moist.  RESPIRATORY: Chest wall symmetrical. Respirations even and non-labored.  CARDIAC: Peripheral pulses 2+ bilaterally.  MSK: Muscle tone and strength appropriate for age.  EXTREMITIES: Without clubbing, cyanosis, or edema.  NEUROLOGIC: No motor or sensory deficits. Steady, even gait. C2-C12 intact.  PSYCH/MENTAL STATUS: Alert, oriented x 3. Cooperative, appropriate mood and affect.    No results found for any visits on 04/18/24.    Assessment & Plan:  1. Poison ivy (Primary) Recommended long sleeves and gloves while performing yard work to help control poison ivy/oak/sumac exposure. Also discussed using Dawn dish soap to remove plant oil with future exposures, good hand hygiene, and washing any linen (including clothes) that comes in contact with plant oil. Advised OTC calamine lotion for discomfort. Start Clobetasol cream BID x 2 weeks to affected areas. If no improvement in 1 week or worsening, reach out to PCP.  - clobetasol cream (TEMOVATE) 0.05 %; Apply 1 Application topically 2 (two) times daily.  Dispense: 30 g; Refill: 0    Meds ordered this encounter  Medications   clobetasol cream (TEMOVATE) 0.05 %    Sig: Apply 1 Application topically 2 (two) times daily.    Dispense:  30 g    Refill:  0    Supervising Provider:   DE Peru, RAYMOND J [[id number]]   Lab Orders  No laboratory test(s) ordered today    Return if symptoms worsen or fail to  improve.   Patient to reach out to office if new, worrisome, or unresolved symptoms arise or  if no improvement in patient's condition. Patient verbalized understanding and is agreeable to treatment plan. All questions answered to patient's satisfaction.   Sylvester Evert, FNP-C

## 2024-04-18 NOTE — Patient Instructions (Signed)
 Results:  I have placed a referral to the lipid clinic at [facility].  Patient should be contacted to schedule an appointment.  I have sent in Mounjaro  2.5 mg for patient to start once weekly.  We will continue on this and follow-up as scheduled in October.  Please notify patient of possible side effects including nausea, constipation, diarrhea with starting Mounjaro .  He should reduce his portion size, avoid greasy or fatty foods, increase healthy diet and exercise, increase water and fiber and use a stool softener as needed if constipation occurs.  He should avoid alcohol use as this can increase his risk of pancreatitis.  I would recommend stopping his glipizide  with starting his Mounjaro  at this time.

## 2024-04-18 NOTE — Progress Notes (Signed)
 Acute Care Office Visit  Subjective:   Larry Hudson Jan 25, 1961 04/18/2024  Chief Complaint  Patient presents with   Poison Ivy    Patient has had poison ivy for about a week and has tried various OTC medications and nothing is helping it go away.    HPI: RASH: Onset: 2wks while performing yard work  Location: bilateral forearms, bilateral lower legs, and left groin area     Course of rash: persistent, itchy  Treatments tried: Benadryl cream, baking soda, Clorox bleach    New medications/antibiotics: no  Tick/insect/pet exposure: no  Recent travel: no  New detergent, new clothing, or other topical exposure: no   Pruritis: yes  Tenderness: no  Feeling ill: no  Fever: no  Mouth lesions: no  Facial/tongue swelling/difficulty breathing:  no  Diabetic or immunocompromised: no     The following portions of the patient's history were reviewed and updated as appropriate: past medical history, past surgical history, family history, social history, allergies, medications, and problem list.   Patient Active Problem List   Diagnosis Date Noted   CKD stage 2 due to type 2 diabetes mellitus (HCC) 04/22/2020   Type 2 diabetes mellitus with stage 2 chronic kidney disease, without long-term current use of insulin  (HCC) 04/22/2020   Hyperlipidemia associated with type 2 diabetes mellitus (HCC) 04/22/2020   Labile hypertension 02/12/2020   Morbid obesity BMI (HCC)  06/03/2015   OSA on CPAP 06/03/2015   Medication management 08/10/2014   Essential hypertension 06/26/2014   Hyperlipidemia 06/26/2014   Vitamin D  deficiency 06/26/2014   Abnormal glucose 06/26/2014   Past Medical History:  Diagnosis Date   Diabetes mellitus without complication (HCC) 12/2008   A1c >14+%   Hyperlipidemia    Hypertension    Prediabetes    Vitamin D  deficiency    Past Surgical History:  Procedure Laterality Date   HEMORRHOID BANDING  2013   NASAL SEPTUM SURGERY  2004   Family History   Problem Relation Age of Onset   Hyperlipidemia Mother    Heart attack Mother    Heart disease Father    Emphysema Father    Outpatient Medications Prior to Visit  Medication Sig Dispense Refill   BABY ASPIRIN PO Take 81 mg by mouth daily.     Cholecalciferol (VITAMIN D  PO) Take 5,000 Int'l Units by mouth in the morning and at bedtime.      Cyanocobalamin (VITAMIN B-12 PO) Take by mouth daily.     ezetimibe  (ZETIA ) 10 MG tablet Take 1 tablet (10 mg total) by mouth daily. 90 tablet 3   glipiZIDE  (GLUCOTROL ) 5 MG tablet TAKE 1 TABLET BY MOUTH THREE TIMES DAILY WITH MEALS FOR DIABETES 270 tablet 0   meloxicam  (MOBIC ) 15 MG tablet Take  1/2 to 1 tablet  Daily  with Food as needed for Pain & Inflammation & try limit to 5 days /week to Avoid Permanent Kidney Damage                                        /  TAKE                                         BY                                                 MOUTH 90 tablet 3   metFORMIN  (GLUCOPHAGE -XR) 500 MG 24 hr tablet Take 2 tablets (1,000 mg total) by mouth 2 (two) times daily with a meal. 360 tablet 1   Multiple Vitamin (MULTIVITAMIN) tablet Take 1 tablet by mouth daily.     Omega-3 Fatty Acids (FISH OIL PO) Take 1,200 mg by mouth daily.     zinc gluconate 50 MG tablet Take 50 mg by mouth daily.     rosuvastatin  (CRESTOR ) 40 MG tablet TAKE 1 TABLET BY MOUTH ONCE DAILY FOR CHOLESTEROL (Patient not taking: Reported on 04/18/2024) 90 tablet 3   tirzepatide  (MOUNJARO ) 2.5 MG/0.5ML Pen Inject 2.5 mg into the skin once a week. (Patient not taking: Reported on 04/18/2024) 2 mL 2   No facility-administered medications prior to visit.   No Known Allergies   ROS: A complete ROS was performed with pertinent positives/negatives noted in the HPI. The remainder of the ROS are negative.    Objective:   Today's Vitals   04/18/24 0912 04/18/24 0951  BP: (!) 163/87 (!) 150/84  Pulse: 72   SpO2:  97%   Weight: 121 kg   Height: 5' 10.5" (1.791 m)     GENERAL: Well-appearing, in NAD. Well nourished.  SKIN: Pink, warm and dry. Multiple areas of raised red rash with vesicles noted to left forearm, right AC, left anterior shin. No drainage, ecchymoses.  Head: Normocephalic. NECK: Trachea midline.  EYES: Conjunctiva clear without exudates. EOMI, PERRL, no drainage present.  THROAT: Uvula midline. Mucous membranes pink and moist.  RESPIRATORY: Chest wall symmetrical. Respirations even and non-labored.  CARDIAC: Peripheral pulses 2+ bilaterally.  MSK: Muscle tone and strength appropriate for age.  EXTREMITIES: Without clubbing, cyanosis, or edema.  NEUROLOGIC: No motor or sensory deficits. Steady, even gait. C2-C12 intact.  PSYCH/MENTAL STATUS: Alert, oriented x 3. Cooperative, appropriate mood and affect.    No results found for any visits on 04/18/24.    Assessment & Plan:  1. Poison ivy (Primary) Recommended long sleeves and gloves while performing yard work to help control poison ivy/oak/sumac exposure. Also discussed using Dawn dish soap to remove plant oil with future exposures, good hand hygiene, and washing any linen (including clothes) that comes in contact with plant oil. Advised OTC calamine lotion for discomfort.  - clobetasol cream (TEMOVATE) 0.05 %; Apply 1 Application topically 2 (two) times daily.  Dispense: 30 g; Refill: 0    Meds ordered this encounter  Medications   clobetasol cream (TEMOVATE) 0.05 %    Sig: Apply 1 Application topically 2 (two) times daily.    Dispense:  30 g    Refill:  0    Supervising Provider:   DE Peru, RAYMOND J [[id number]]   Lab Orders  No laboratory test(s) ordered today   No images are attached to the encounter or orders placed in the encounter.  Return if symptoms worsen or fail to improve.    Patient to reach out to office if new, worrisome,  or unresolved symptoms arise or if no improvement in patient's condition. Patient  verbalized understanding and is agreeable to treatment plan. All questions answered to patient's satisfaction.   Treatment plan and recommendation(s) reviewed by supervising preceptor, Annell Barrow, FNP-C, prior to clinic discharge.    Therisa Flatten, BSN, RN DNP Student

## 2024-07-04 ENCOUNTER — Other Ambulatory Visit (HOSPITAL_BASED_OUTPATIENT_CLINIC_OR_DEPARTMENT_OTHER): Payer: Self-pay | Admitting: Family Medicine

## 2024-07-04 NOTE — Telephone Encounter (Signed)
 Attempted to call pt but unable to reach. Left detailed VM for pt about medication and dosage and have also sent pt a VM. Will await a response.

## 2024-07-04 NOTE — Telephone Encounter (Signed)
 Larry Hudson, please advise if you want to keep pt at same dosage.

## 2024-07-11 ENCOUNTER — Other Ambulatory Visit (HOSPITAL_BASED_OUTPATIENT_CLINIC_OR_DEPARTMENT_OTHER): Payer: Self-pay | Admitting: Family Medicine

## 2024-07-11 MED ORDER — TIRZEPATIDE 5 MG/0.5ML ~~LOC~~ SOAJ
5.0000 mg | SUBCUTANEOUS | 2 refills | Status: AC
Start: 2024-07-11 — End: ?

## 2024-07-11 NOTE — Telephone Encounter (Signed)
 Pt is fine having the mounjaro  increased. Routing to PCP.

## 2024-08-02 NOTE — Progress Notes (Signed)
 Larry Hudson                                          MRN: [id number]   08/02/2024   The [facility]I Quality Team Specialist reviewed this patient medical record for the purposes of chart review for care gap closure. The following were reviewed: abstraction for care gap closure-kidney health evaluation for diabetes:eGFR  and uACR.    VBCI Quality Team

## 2024-08-08 ENCOUNTER — Encounter (HOSPITAL_BASED_OUTPATIENT_CLINIC_OR_DEPARTMENT_OTHER): Payer: Self-pay | Admitting: Internal Medicine

## 2024-08-08 ENCOUNTER — Ambulatory Visit (HOSPITAL_BASED_OUTPATIENT_CLINIC_OR_DEPARTMENT_OTHER): Admitting: Internal Medicine

## 2024-08-08 VITALS — BP 142/82 | HR 67 | Ht 71.0 in | Wt 254.3 lb

## 2024-08-08 DIAGNOSIS — E1169 Type 2 diabetes mellitus with other specified complication: Secondary | ICD-10-CM

## 2024-08-08 DIAGNOSIS — E782 Mixed hyperlipidemia: Secondary | ICD-10-CM | POA: Diagnosis not present

## 2024-08-08 DIAGNOSIS — E7849 Other hyperlipidemia: Secondary | ICD-10-CM | POA: Diagnosis not present

## 2024-08-08 DIAGNOSIS — E1122 Type 2 diabetes mellitus with diabetic chronic kidney disease: Secondary | ICD-10-CM

## 2024-08-08 DIAGNOSIS — I10 Essential (primary) hypertension: Secondary | ICD-10-CM

## 2024-08-08 DIAGNOSIS — E785 Hyperlipidemia, unspecified: Secondary | ICD-10-CM

## 2024-08-08 DIAGNOSIS — N182 Chronic kidney disease, stage 2 (mild): Secondary | ICD-10-CM

## 2024-08-08 NOTE — Progress Notes (Signed)
 LIPID CLINIC CONSULT NOTE  Chief Complaint:  Manage dyslipidemia  Primary Care Physician: Knute Thersia Bitters, FNP  Primary Cardiologist:  None  HPI:  Larry Hudson is a 63 y.o. male who is being seen today for the evaluation of dyslipidemia at the request of Knute Thersia Bitters, *. This is a pleasant 63 year old male of [place] heritage with a strong family history of multiple family members who had coronary artery disease, heart attacks and stroke including both of his parents.  They were both noted to have high cholesterol.  His personal risk factors include type 2 diabetes, hyperlipidemia and hypertension.  He has had a history of high cholesterol.  He has been on rosuvastatin  40 mg daily for some time however he is most recent lipid profile showed total cholesterol 200, triglycerides 308, HDL 26 and LDL 120.  Extrapolating the LDL to be 50% higher off of rosuvastatin  would suggest that he has a familial hyperlipidemia, possibly a familial combined hyperlipidemia.  He does have more of a metabolic dyslipidemia with low HDL high triglycerides and elevated LDL cholesterol is seen in diabetics.  His A1c at 1 point was greater than 14% however recently he is A1c in May was 7.1%.  He has been on Mounjaro  for a couple months as well as metformin .  He denies any chest pain or shortness of breath.  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  He says it is a very physical job.  PMHx:  Past Medical History:  Diagnosis Date   Diabetes mellitus without complication (HCC) 12/2008   A1c >14+%   Hyperlipidemia    Hypertension    Prediabetes    Vitamin D  deficiency     Past Surgical History:  Procedure Laterality Date   HEMORRHOID BANDING  2013   NASAL SEPTUM SURGERY  2004    FAMHx:  Family History  Problem Relation Age of Onset   Hyperlipidemia Mother    Heart attack Mother    Heart disease Father    Emphysema Father     SOCHx:   reports that he quit smoking  about 28 years ago. His smoking use included cigarettes. He has never used smokeless tobacco. He reports that he does not drink alcohol and does not use drugs.  ALLERGIES:  No Known Allergies  ROS: Pertinent items noted in HPI and remainder of comprehensive ROS otherwise negative.  HOME MEDS: Current Outpatient Medications on File Prior to Visit  Medication Sig Dispense Refill   BABY ASPIRIN PO Take 81 mg by mouth daily.     Cholecalciferol (VITAMIN D  PO) Take 5,000 Int'l Units by mouth in the morning and at bedtime.      clobetasol  cream (TEMOVATE ) 0.05 % Apply 1 Application topically 2 (two) times daily. 30 g 0   Cyanocobalamin (VITAMIN B-12 PO) Take by mouth daily.     meloxicam  (MOBIC ) 15 MG tablet Take  1/2 to 1 tablet  Daily  with Food as needed for Pain & Inflammation & try limit to 5 days /week to Avoid Permanent Kidney Damage                                        /  TAKE                                         BY                                                 MOUTH 90 tablet 3   metFORMIN  (GLUCOPHAGE -XR) 500 MG 24 hr tablet Take 2 tablets (1,000 mg total) by mouth 2 (two) times daily with a meal. 360 tablet 1   Multiple Vitamin (MULTIVITAMIN) tablet Take 1 tablet by mouth daily.     Omega-3 Fatty Acids (FISH OIL PO) Take 1,200 mg by mouth daily.     rosuvastatin  (CRESTOR ) 40 MG tablet TAKE 1 TABLET BY MOUTH ONCE DAILY FOR CHOLESTEROL 90 tablet 3   tirzepatide  (MOUNJARO ) 5 MG/0.5ML Pen Inject 5 mg into the skin once a week. 6 mL 2   zinc gluconate 50 MG tablet Take 50 mg by mouth daily.     No current facility-administered medications on file prior to visit.    LABS/IMAGING: No results found for this or any previous visit (from the past 48 hours). No results found.  LIPID PANEL:    Component Value Date/Time   CHOL 200 (H) 03/14/2024 1025   TRIG 308 (H) 03/14/2024 1025   HDL 26 (L) 03/14/2024 1025   CHOLHDL  7.7 (H) 03/14/2024 1025   CHOLHDL 6.2 (H) 07/13/2023 1030   VLDL 52 (H) 04/12/2017 1654   LDLCALC 120 (H) 03/14/2024 1025   LDLCALC 120 (H) 07/13/2023 1030    No results found for: LIPOA   WEIGHTS: Wt Readings from Last 3 Encounters:  08/08/24 254 lb 4.8 oz (115.3 kg)  04/18/24 266 lb 12.8 oz (121 kg)  02/29/24 265 lb (120.2 kg)    VITALS: BP (!) 142/82   Pulse 67   Ht 5' 11 (1.803 m)   Wt 254 lb 4.8 oz (115.3 kg)   SpO2 97%   BMI 35.47 kg/m   EXAM: Deferred  EKG: Deferred  ASSESSMENT: 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   Mr. Sackrider is of [place] heritage and has strong family history of heart disease in multiple family members including both parents.  Lab work suggests a familial combined hyperlipidemia or possible metabolic dyslipidemia and diabetic with a low HDL high triglycerides and high LDL.  He is on high intensity statin therapy although his LDL remains above target less than at least 70.  I would like to further stratify him with a calcium  score and will obtain a lipid NMR and LP(a) today.  We briefly discussed PCSK9 inhibitors which I think would be the next best option for him.  Since he uses the Mounjaro  he is used to and injectable therapy.  Thanks for the kind referral.  I will contact him with the results and a plan for follow-up afterwards.  Vinie KYM Maxcy, MD, [facility], FNLA, FACP  [place]  [facility]  Medical Director of the Advanced Lipid Disorders &  Cardiovascular Risk Reduction Clinic Diplomate of the American Board of Clinical Lipidology Attending Cardiologist  Direct Dial: [phone]  Fax: [phone]  Website:  [email or web address]  Vinie  C Clovis Mankins 08/08/2024, 8:08 AM

## 2024-08-08 NOTE — Patient Instructions (Signed)
 Medication Instructions:   Your physician recommends that you continue on your current medications as directed. Please refer to the Current Medication list given to you today.  *If you need a refill on your cardiac medications before your next appointment, please call your pharmacy*   Lab Work:  TODAY ON THE 3RD FLOOR AT [address]--NMR LIPOPROFILE AND LIPOPROTEIN A  If you have labs (blood work) drawn today and your tests are completely normal, you will receive your results only by: MyChart Message (if you have MyChart) OR A paper copy in the mail If you have any lab test that is abnormal or we need to change your treatment, we will call you to review the results.   Testing/Procedures:  CARDIAC CALCIUM  SCORE (SELF PAY)--TO BE DONE TODAY IF POSSIBLE PER DR. HILTY--SCHEDULING PLEASE ARRANGE FOR TODAY   Follow-Up:  AS NEEDED WITH DR. HILTY

## 2024-08-09 LAB — NMR, LIPOPROFILE
Cholesterol, Total: 165 mg/dL (ref 100–199)
HDL Particle Number: 27.1 umol/L — ABNORMAL LOW (ref >=30.5)
HDL-C: 29 mg/dL — ABNORMAL LOW (ref >39)
LDL Particle Number: 1635 nmol/L — ABNORMAL HIGH (ref <1000)
LDL Size: 19.9 nm — ABNORMAL LOW (ref >20.5)
LDL-C (NIH Calc): 105 mg/dL — ABNORMAL HIGH (ref 0–99)
LP-IR Score: 73 — ABNORMAL HIGH (ref <=45)
Small LDL Particle Number: 1197 nmol/L — ABNORMAL HIGH (ref <=527)
Triglycerides: 175 mg/dL — ABNORMAL HIGH (ref 0–149)

## 2024-08-09 LAB — LIPOPROTEIN A (LPA): Lipoprotein (a): 148.2 nmol/L — ABNORMAL HIGH (ref <75.0)

## 2024-08-13 ENCOUNTER — Ambulatory Visit: Payer: Self-pay | Admitting: Internal Medicine

## 2024-08-13 DIAGNOSIS — E7849 Other hyperlipidemia: Secondary | ICD-10-CM

## 2024-08-13 DIAGNOSIS — E7841 Elevated Lipoprotein(a): Secondary | ICD-10-CM

## 2024-08-14 ENCOUNTER — Other Ambulatory Visit (HOSPITAL_COMMUNITY): Payer: Self-pay

## 2024-08-14 ENCOUNTER — Telehealth: Payer: Self-pay | Admitting: Pharmacy Technician

## 2024-08-14 NOTE — Telephone Encounter (Signed)
 Pharmacy Patient Advocate Encounter   Received notification from Physician's Office - andriette e that prior authorization for repatha is required/requested.   Insurance verification completed.   The patient is insured through [employer].   Per test claim: PA required; PA submitted to above mentioned insurance via Latent Key/confirmation #/EOC [id number] Status is pending

## 2024-08-23 ENCOUNTER — Other Ambulatory Visit (HOSPITAL_COMMUNITY): Payer: Self-pay

## 2024-08-23 NOTE — Telephone Encounter (Signed)
 Pharmacy Patient Advocate Encounter  Received notification from [facility] that Prior Authorization for repatha has been APPROVED from 08/23/24 to 08/23/25. Ran test claim, Copay is $85.00- one month. This test claim was processed through [facility]- copay amounts may vary at other pharmacies due to pharmacy/plan contracts, or as the patient moves through the different stages of their insurance plan.   PA #/Case ID/Reference #: [id number]

## 2024-08-23 NOTE — Telephone Encounter (Signed)
 Still pending

## 2024-08-24 MED ORDER — REPATHA SURECLICK 140 MG/ML ~~LOC~~ SOAJ: 140.0000 mg | SUBCUTANEOUS | 3 refills | Status: AC

## 2024-08-24 NOTE — Addendum Note (Signed)
 Addended by: LORING ANDRIETTE HERO on: 08/24/2024 01:55 PM   Modules accepted: Orders

## 2024-08-24 NOTE — Telephone Encounter (Signed)
 MyChart message sent to patient with update on med approval. Rx(s) sent to pharmacy electronically.

## 2024-09-05 ENCOUNTER — Encounter (HOSPITAL_BASED_OUTPATIENT_CLINIC_OR_DEPARTMENT_OTHER): Payer: Self-pay | Admitting: Family Medicine

## 2024-09-05 ENCOUNTER — Ambulatory Visit (INDEPENDENT_AMBULATORY_CARE_PROVIDER_SITE_OTHER): Admitting: Family Medicine

## 2024-09-05 ENCOUNTER — Ambulatory Visit (HOSPITAL_BASED_OUTPATIENT_CLINIC_OR_DEPARTMENT_OTHER)
Admission: RE | Admit: 2024-09-05 | Discharge: 2024-09-05 | Disposition: A | Discharge status: Home / Self Care | Payer: Self-pay | Source: Ambulatory Visit | Attending: Internal Medicine | Admitting: Internal Medicine

## 2024-09-05 VITALS — BP 126/88 | HR 66 | Ht 71.0 in | Wt 244.0 lb

## 2024-09-05 DIAGNOSIS — E785 Hyperlipidemia, unspecified: Secondary | ICD-10-CM | POA: Insufficient documentation

## 2024-09-05 DIAGNOSIS — E7849 Other hyperlipidemia: Secondary | ICD-10-CM | POA: Insufficient documentation

## 2024-09-05 DIAGNOSIS — E1122 Type 2 diabetes mellitus with diabetic chronic kidney disease: Secondary | ICD-10-CM

## 2024-09-05 DIAGNOSIS — Z0000 Encounter for general adult medical examination without abnormal findings: Secondary | ICD-10-CM

## 2024-09-05 DIAGNOSIS — N182 Chronic kidney disease, stage 2 (mild): Secondary | ICD-10-CM | POA: Insufficient documentation

## 2024-09-05 DIAGNOSIS — Z23 Encounter for immunization: Secondary | ICD-10-CM | POA: Diagnosis not present

## 2024-09-05 DIAGNOSIS — E782 Mixed hyperlipidemia: Secondary | ICD-10-CM | POA: Diagnosis not present

## 2024-09-05 DIAGNOSIS — Z1211 Encounter for screening for malignant neoplasm of colon: Secondary | ICD-10-CM

## 2024-09-05 DIAGNOSIS — Z125 Encounter for screening for malignant neoplasm of prostate: Secondary | ICD-10-CM

## 2024-09-05 DIAGNOSIS — Z7984 Long term (current) use of oral hypoglycemic drugs: Secondary | ICD-10-CM

## 2024-09-05 DIAGNOSIS — E1169 Type 2 diabetes mellitus with other specified complication: Secondary | ICD-10-CM | POA: Insufficient documentation

## 2024-09-05 NOTE — Patient Instructions (Addendum)
 Schedule Colonoscopy   Get your shingrix   Ophthalmology Offices   [facility]  [address]  [place], [place] [address] [phone]  [facility] [address] [place], [place] [address] Phone: [phone]  [facility] [address] [place], [place] [address] Phone: [phone]  [facility]  Optometrist [address]  [phone]  [facility] Optometrist [address]  [phone]

## 2024-09-05 NOTE — Progress Notes (Signed)
 Subjective:   Larry Hudson 05-May-1961 09/05/2024  CC: Chief Complaint  Patient presents with   Annual Exam    Patient is here today for his physical. States he has been fatigued but knows that this is due to work.    HPI: Larry Hudson is a 63 y.o. male who presents for a routine health maintenance exam.  Labs collected at time of visit.   DIABETES MELLITUS: Larry Hudson presents for the medical management of diabetes.  Current diabetes medication regimen: Mounjaro  5mg , Metformin  1000mg  BID Patient is  adhering to a diabetic diet.  Patient is  exercising regularly.  Patient is not checking BS regularly.  Patient is  checking their feet regularly.  Denies polydipsia, polyphagia, polyuria, open wounds or ulcers on feet.  Lab Results  Component Value Date   HGBA1C 7.1 (H) 03/14/2024    Foot Exam: 02/29/2024 Lab Results  Component Value Date   MICROALBUR 10 02/29/2024   MICROALBUR 2.3 09/08/2022    Wt Readings from Last 3 Encounters:  09/05/24 255 lb (115.7 kg)  08/08/24 254 lb 4.8 oz (115.3 kg)  04/18/24 266 lb 12.8 oz (121 kg)   HYPERLIPIDEMIA: Larry Hudson presents for the medical management of hyperlipidemia. Patient was referred to lipid clinic and completed CT cardiac scoring test today. He was recommended to start Repatha but has not due to cost.  Patient's current HLD regimen is: Crestor  40mg  , Repatha (patient has not started due to cost)  Patient is  currently taking prescribed medications for HLD.  Adhering to heathy diet: Yes Exercising regularly: yes Denies myalgias.  Lab Results  Component Value Date   CHOL 200 (H) 03/14/2024   HDL 26 (L) 03/14/2024   LDLCALC 120 (H) 03/14/2024   TRIG 308 (H) 03/14/2024   CHOLHDL 7.7 (H) 03/14/2024     HEALTH SCREENINGS: - Vision Screening: Recommended - Dental Visits: up to date - Testicular Exam: Declined - STD Screening: Declined - PSA (50+): Ordered today   Lab Results  Component Value Date   PSA 2.45  09/08/2022   PSA 2.68 08/08/2021   PSA 2.0 04/23/2020     - Colonoscopy (45+): Ordered today  Discussed with patient purpose of the colonoscopy is to detect colon cancer at curable precancerous or early stages  - AAA Screening: Not applicable  Men age 21-75 who have ever smoked - Lung Cancer screening with low-dose CT: Not applicable-  Adults age 77-80 who are current cigarette smokers or quit within the last 15 years. Must have 20 pack year history.   Depression and Anxiety Screen done today and results listed below:     09/05/2024    1:41 PM 04/18/2024    9:13 AM 02/29/2024    1:12 PM 02/12/2020   10:09 PM 01/06/2020    7:59 PM  Depression screen PHQ 2/9  Decreased Interest 0 0 0 0 0  Down, Depressed, Hopeless 0 0 0 0 0  PHQ - 2 Score 0 0 0 0 0  Altered sleeping 2 0 0    Tired, decreased energy 0 0 3    Change in appetite 0 0 0    Feeling bad or failure about yourself  0 0 0    Trouble concentrating 0 0 0    Moving slowly or fidgety/restless 0 0 0    Suicidal thoughts 0 0 0    PHQ-9 Score 2 0 3    Difficult doing work/chores Not difficult at all Not difficult at all Not difficult  at all        09/05/2024    1:41 PM 04/18/2024    9:13 AM 02/29/2024    1:13 PM  GAD 7 : Generalized Anxiety Score  Nervous, Anxious, on Edge 0 0 0  Control/stop worrying 0 0 0  Worry too much - different things 0 0 0  Trouble relaxing 0 0 0  Restless 0 0 0  Easily annoyed or irritable 0 0 0  Afraid - awful might happen 0 0 0  Total GAD 7 Score 0 0 0  Anxiety Difficulty Not difficult at all Not difficult at all Not difficult at all    IMMUNIZATIONS:  - Tdap: Tetanus vaccination status reviewed: Td vaccination indicated and given today. - Influenza: Refused - Pneumovax: Not applicable - Prevnar: Not applicable - Shingrix vaccine (50+): Recommended   Past medical history, surgical history, medications, allergies, family history and social history reviewed with patient today and changes  made to appropriate areas of the chart.   Past Medical History:  Diagnosis Date   Diabetes mellitus without complication (HCC) 12/2008   A1c >14+%   Hyperlipidemia    Hypertension    Prediabetes    Vitamin D  deficiency     Past Surgical History:  Procedure Laterality Date   HEMORRHOID BANDING  2013   NASAL SEPTUM SURGERY  2004    Current Outpatient Medications on File Prior to Visit  Medication Sig   BABY ASPIRIN PO Take 81 mg by mouth daily.   Cholecalciferol (VITAMIN D  PO) Take 5,000 Int'l Units by mouth in the morning and at bedtime.    Cyanocobalamin (VITAMIN B-12 PO) Take by mouth daily.   metFORMIN  (GLUCOPHAGE -XR) 500 MG 24 hr tablet Take 2 tablets (1,000 mg total) by mouth 2 (two) times daily with a meal.   Multiple Vitamin (MULTIVITAMIN) tablet Take 1 tablet by mouth daily.   Omega-3 Fatty Acids (FISH OIL PO) Take 1,200 mg by mouth daily.   POTASSIUM PO Take by mouth as needed (when he does not eat a banana).   rosuvastatin  (CRESTOR ) 40 MG tablet TAKE 1 TABLET BY MOUTH ONCE DAILY FOR CHOLESTEROL   tirzepatide  (MOUNJARO ) 5 MG/0.5ML Pen Inject 5 mg into the skin once a week.   zinc gluconate 50 MG tablet Take 50 mg by mouth daily.   Evolocumab (REPATHA SURECLICK) 140 MG/ML SOAJ Inject 140 mg into the skin every 14 (fourteen) days. (Patient not taking: Reported on 09/05/2024)   No current facility-administered medications on file prior to visit.    No Known Allergies   Social History   Socioeconomic History   Marital status: Married    Spouse name: Not on file   Number of children: Not on file   Years of education: Not on file   Highest education level: Not on file  Occupational History   Not on file  Tobacco Use   Smoking status: Former    Current packs/day: 0.00    Types: Cigarettes    Quit date: 02/03/1996    Years since quitting: 28.6   Smokeless tobacco: Never  Vaping Use   Vaping status: Never Used  Substance and Sexual Activity   Alcohol use: No     Alcohol/week: 0.0 standard drinks of alcohol   Drug use: No   Sexual activity: Not on file  Other Topics Concern   Not on file  Social History Narrative   Not on file   Social Drivers of Health   Financial Resource Strain: Not on file  Food Insecurity: Not on file  Transportation Needs: Not on file  Physical Activity: Not on file  Stress: Not on file  Social Connections: Not on file  Intimate Partner Violence: Not on file   Social History   Tobacco Use  Smoking Status Former   Current packs/day: 0.00   Types: Cigarettes   Quit date: 02/03/1996   Years since quitting: 28.6  Smokeless Tobacco Never   Social History   Substance and Sexual Activity  Alcohol Use No   Alcohol/week: 0.0 standard drinks of alcohol     Family History  Problem Relation Age of Onset   Hyperlipidemia Mother    Heart attack Mother    Heart disease Father    Emphysema Father      ROS: Denies fever, fatigue, unexplained weight loss/gain, CP, SHOB, and palpatitations. Denies neurological deficits, gastrointestinal and/or genitourinary complaints, and skin changes.   Objective:   Today's Vitals   09/05/24 1337 09/05/24 1420  BP: (!) 152/86 126/88  Pulse: 66   SpO2: 99%   Weight: 255 lb (115.7 kg)   Height: 5' 11 (1.803 m)     GENERAL APPEARANCE: Well-appearing, in NAD. Well nourished.  SKIN: Pink, warm and dry. Turgor normal. No rash, lesion, ulceration, or ecchymoses. Hair evenly distributed.  HEENT: HEAD: Normocephalic.  EYES: PERRLA. EOMI. Lids intact w/o defect. Sclera white, Conjunctiva pink w/o exudate.  EARS: External ear w/o redness, swelling, masses or lesions. EAC clear. TM's intact, translucent w/o bulging, appropriate landmarks visualized. Appropriate acuity to conversational tones.  NOSE: Septum midline w/o deformity. Nares patent, mucosa pink and non-inflamed w/o drainage. No sinus tenderness.  THROAT: Uvula midline. Oropharynx clear. Tonsils non-inflamed w/o exudate.  Oral mucosa pink and moist.  NECK: Supple, Trachea midline. Full ROM w/o pain or tenderness. No lymphadenopathy. Thyroid  non-tender w/o enlargement or palpable masses.  RESPIRATORY: Chest wall symmetrical w/o masses. Respirations even and non-labored. Breath sounds clear to auscultation bilaterally. No wheezes, rales, rhonchi, or crackles. CARDIAC: S1, S2 present, regular rate and rhythm. No gallops, murmurs, rubs, or clicks. PMI w/o lifts, heaves, or thrills. No carotid bruits. Capillary refill <2 seconds. Peripheral pulses 2+ bilaterally. GI: Abdomen soft w/o distention. Normoactive bowel sounds. No palpable masses or tenderness. No guarding or rebound tenderness. Liver and spleen w/o tenderness or enlargement. No CVA tenderness.  GU: Pt deferred exam. MSK: Muscle tone and strength appropriate for age, w/o atrophy or abnormal movement. EXTREMITIES: Active ROM intact, w/o tenderness, crepitus, or contracture. No obvious joint deformities or effusions. No clubbing, edema, or cyanosis.  NEUROLOGIC: CN's II-XII intact. Motor strength symmetrical with no obvious weakness. No sensory deficits. DTR 2+ symmetric bilaterally. Steady, even gait.  PSYCH/MENTAL STATUS: Alert, oriented x 3. Cooperative, appropriate mood and affect.    Assessment & Plan:  1. Annual physical exam (Primary) Discussed preventative screenings, vaccines, and healthy lifestyle with patient. Recommend Shingrix vaccine at patient's convenience in pharmacy. Labs collected today.  - CBC with Differential/Platelet - Comprehensive metabolic panel with GFR  2. Screening for colon cancer - Ambulatory referral to Gastroenterology  3. Type 2 diabetes mellitus with stage 2 chronic kidney disease, without long-term current use of insulin  (HCC) Discussed use of Glp 1 and Metformin . Will consider decreasing metformin  pending A1C. Continue lifestyle changes with diet and exercise.  - Hemoglobin A1c  4. Hyperlipidemia Currently managed by  lipid clinic. Copay Card for repatha provided to patient to assist with cost. Discussed benefits of medication with patient and he will contact cardiology if unable to  afford and look into financial assistance.   5. Immunization due - Tdap vaccine greater than or equal to 7yo IM  6. Prostate cancer screening - PSA    Orders Placed This Encounter  Procedures   Tdap vaccine greater than or equal to 7yo IM   CBC with Differential/Platelet   Comprehensive metabolic panel with GFR   Hemoglobin A1c   PSA   Ambulatory referral to Gastroenterology    Referral Priority:   Routine    Referral Type:   Consultation    Referral Reason:   Specialty Services Required    Number of Visits Requested:   1    PATIENT COUNSELING: - Encouraged to adjust caloric intake to maintain or achieve ideal body weight, to reduce intake of dietary saturated fat and total fat, to limit sodium intake by avoiding high sodium foods and not adding table salt, and to maintain adequate dietary potassium and calcium  preferably from fresh fruits, vegetables, and low-fat dairy products.   - Advised to avoid cigarette smoking. - Discussed with the patient that most people either abstain from alcohol or drink within safe limits (<=14/week and <=4 drinks/occasion for males, <=7/weeks and <= 3 drinks/occasion for females) and that the risk for alcohol disorders and other health effects rises proportionally with the number of drinks per week and how often a drinker exceeds daily limits. - Discussed cessation/primary prevention of drug use and availability of treatment for abuse.   - Stressed the importance of regular exercise - Injury prevention: Discussed safety belts, safety helmets, smoke detector, smoking near bedding or upholstery.  - Dental health: Discussed importance of regular tooth brushing, flossing, and dental visits.  - Sexuality: Discussed sexually transmitted diseases, partner selection, use of condoms, avoidance of  unintended pregnancy  and contraceptive alternatives.   NEXT PREVENTATIVE PHYSICAL DUE IN 1 YEAR.  Return in about 6 months (around 03/06/2025) for DIABETES CHECK UP.  Patient to reach out to office if new, worrisome, or unresolved symptoms arise or if no improvement in patient's condition. Patient verbalized understanding and is agreeable to treatment plan. All questions answered to patient's satisfaction.    Thersia Schuyler Stark, [place]

## 2024-09-06 ENCOUNTER — Ambulatory Visit (HOSPITAL_BASED_OUTPATIENT_CLINIC_OR_DEPARTMENT_OTHER): Payer: Self-pay | Admitting: Family Medicine

## 2024-09-06 DIAGNOSIS — R972 Elevated prostate specific antigen [PSA]: Secondary | ICD-10-CM

## 2024-09-06 DIAGNOSIS — E1122 Type 2 diabetes mellitus with diabetic chronic kidney disease: Secondary | ICD-10-CM

## 2024-09-06 LAB — COMPREHENSIVE METABOLIC PANEL WITH GFR
ALT: 30 IU/L (ref 0–44)
AST: 28 IU/L (ref 0–40)
Albumin: 4.6 g/dL (ref 3.9–4.9)
Alkaline Phosphatase: 60 IU/L (ref 47–123)
BUN/Creatinine Ratio: 17 (ref 10–24)
BUN: 15 mg/dL (ref 8–27)
Bilirubin Total: 1 mg/dL (ref 0.0–1.2)
CO2: 19 mmol/L — ABNORMAL LOW (ref 20–29)
Calcium: 9.8 mg/dL (ref 8.6–10.2)
Chloride: 104 mmol/L (ref 96–106)
Creatinine, Ser: 0.87 mg/dL (ref 0.76–1.27)
Globulin, Total: 2.4 g/dL (ref 1.5–4.5)
Glucose: 91 mg/dL (ref 70–99)
Potassium: 4.2 mmol/L (ref 3.5–5.2)
Sodium: 139 mmol/L (ref 134–144)
Total Protein: 7 g/dL (ref 6.0–8.5)
eGFR: 97 mL/min/1.73 (ref >59)

## 2024-09-06 LAB — CBC WITH DIFFERENTIAL/PLATELET
Basophils Absolute: 0.1 x10E3/uL (ref 0.0–0.2)
Basos: 1 %
EOS (ABSOLUTE): 0.1 x10E3/uL (ref 0.0–0.4)
Eos: 1 %
Hematocrit: 44.5 % (ref 37.5–51.0)
Hemoglobin: 14.2 g/dL (ref 13.0–17.7)
Immature Grans (Abs): 0 x10E3/uL (ref 0.0–0.1)
Immature Granulocytes: 0 %
Lymphocytes Absolute: 2.3 x10E3/uL (ref 0.7–3.1)
Lymphs: 35 %
MCH: 27.6 pg (ref 26.6–33.0)
MCHC: 31.9 g/dL (ref 31.5–35.7)
MCV: 86 fL (ref 79–97)
Monocytes Absolute: 0.5 x10E3/uL (ref 0.1–0.9)
Monocytes: 7 %
Neutrophils Absolute: 3.6 x10E3/uL (ref 1.4–7.0)
Neutrophils: 56 %
Platelets: 268 x10E3/uL (ref 150–450)
RBC: 5.15 x10E6/uL (ref 4.14–5.80)
RDW: 14 % (ref 11.6–15.4)
WBC: 6.5 x10E3/uL (ref 3.4–10.8)

## 2024-09-06 LAB — HEMOGLOBIN A1C
Est. average glucose Bld gHb Est-mCnc: 131 mg/dL
Hgb A1c MFr Bld: 6.2 % — ABNORMAL HIGH (ref 4.8–5.6)

## 2024-09-06 LAB — PSA: Prostate Specific Ag, Serum: 4.8 ng/mL — ABNORMAL HIGH (ref 0.0–4.0)

## 2024-09-06 MED ORDER — METFORMIN HCL ER 500 MG PO TB24: 500.0000 mg | ORAL_TABLET | Freq: Two times a day (BID) | ORAL | Status: AC

## 2024-09-06 NOTE — Progress Notes (Signed)
 Please schedule for lab appointment in 2 weeks.  Hi Larry Hudson, Your blood counts are stable.  Your hemoglobin A1c has improved and is down to 6.2.  We can reduce your metformin  to 1 tablet twice a day with meals. Your electrolytes, kidney and liver function are stable.  Your prostate antigen came back slightly elevated.  This can sometimes be due to physical activity, enlargement of the prostate.  I would like to recheck this in about 2 weeks with lab work.

## 2024-09-26 ENCOUNTER — Other Ambulatory Visit (HOSPITAL_BASED_OUTPATIENT_CLINIC_OR_DEPARTMENT_OTHER): Payer: Self-pay

## 2024-09-26 ENCOUNTER — Ambulatory Visit (HOSPITAL_BASED_OUTPATIENT_CLINIC_OR_DEPARTMENT_OTHER)

## 2024-09-26 DIAGNOSIS — R972 Elevated prostate specific antigen [PSA]: Secondary | ICD-10-CM

## 2024-09-27 ENCOUNTER — Ambulatory Visit (HOSPITAL_BASED_OUTPATIENT_CLINIC_OR_DEPARTMENT_OTHER): Payer: Self-pay | Admitting: Family Medicine

## 2024-09-27 DIAGNOSIS — R972 Elevated prostate specific antigen [PSA]: Secondary | ICD-10-CM | POA: Insufficient documentation

## 2024-09-27 LAB — PSA: Prostate Specific Ag, Serum: 4.9 ng/mL — ABNORMAL HIGH (ref 0.0–4.0)

## 2024-09-27 NOTE — Progress Notes (Signed)
 Hi Larry Hudson, Your PSA is still slightly elevated above normal.  At this time I would recommend a further evaluation with urology.  I have placed a referral to the urologist in [place] for you and they should be reaching out to you to get an appointment scheduled.  If you have further questions please let me know

## 2024-10-11 ENCOUNTER — Encounter: Payer: BC Managed Care – PPO | Admitting: Nurse Practitioner

## 2024-11-22 ENCOUNTER — Telehealth: Payer: Self-pay

## 2024-11-22 NOTE — Telephone Encounter (Signed)
 Pt called in about rescheduling appointment. Pt is made aware that Dr. Matilda is retiring, pt was offer appointment with another provider denial b/c pt state's he can only do Tuesday and he works on the other days.

## 2024-12-03 NOTE — Progress Notes (Unsigned)
 "   CC: Elevated PSA   HPI: 64 yo male here for elevated PSA. Referral sent by Thersia Stark, FNP. Previous PSA data-- 10.28.2025--4.8 11.18.2025--4.9   PMH: Past Medical History:  Diagnosis Date   Diabetes mellitus without complication (HCC) 12/2008   A1c >14+%   Hyperlipidemia    Hypertension    Prediabetes    Vitamin D  deficiency     Surgical History: Past Surgical History:  Procedure Laterality Date   HEMORRHOID BANDING  2013   NASAL SEPTUM SURGERY  2004    Home Medications:  Allergies as of 12/05/2024   No Known Allergies      Medication List        Accurate as of December 03, 2024  9:58 AM. If you have any questions, ask your nurse or doctor.          BABY ASPIRIN PO Take 81 mg by mouth daily.   FISH OIL PO Take 1,200 mg by mouth daily.   metFORMIN  500 MG 24 hr tablet Commonly known as: GLUCOPHAGE -XR Take 1 tablet (500 mg total) by mouth 2 (two) times daily with a meal.   multivitamin tablet Take 1 tablet by mouth daily.   POTASSIUM PO Take by mouth as needed (when he does not eat a banana).   Repatha  SureClick 140 MG/ML Soaj Generic drug: Evolocumab  Inject 140 mg into the skin every 14 (fourteen) days.   rosuvastatin  40 MG tablet Commonly known as: CRESTOR  TAKE 1 TABLET BY MOUTH ONCE DAILY FOR CHOLESTEROL   tirzepatide  5 MG/0.5ML Pen Commonly known as: MOUNJARO  Inject 5 mg into the skin once a week.   VITAMIN B-12 PO Take by mouth daily.   VITAMIN D  PO Take 5,000 Int'l Units by mouth in the morning and at bedtime.   zinc gluconate 50 MG tablet Take 50 mg by mouth daily.        Allergies: Allergies[1]  Family History: Family History  Problem Relation Age of Onset   Hyperlipidemia Mother    Heart attack Mother    Heart disease Father    Emphysema Father     Social History:  reports that he quit smoking about 28 years ago. His smoking use included cigarettes. He has never used smokeless tobacco. He reports that he does  not drink alcohol and does not use drugs.  ROS: All other review of systems were reviewed and are negative except what is noted above in HPI  Physical Exam: There were no vitals taken for this visit.  Constitutional:  Alert and oriented, No acute distress. HEENT: [place] AT, moist mucus membranes.  Trachea midline, no masses. Cardiovascular: No clubbing, cyanosis, or edema. Respiratory: Normal respiratory effort, no increased work of breathing. GI: No inguinal hernias GU: Normal phallus. No masses/lesions on penis, testis, scrotum. Prostate ***g smooth no nodules no induration.  Lymph: No cervical or inguinal lymphadenopathy. Skin: No rashes, bruises or suspicious lesions. Neurologic: Grossly intact, no focal deficits, moving all 4 extremities. Psychiatric: Normal mood and affect.  Laboratory Data: Lab Results  Component Value Date   WBC 6.5 09/05/2024   HGB 14.2 09/05/2024   HCT 44.5 09/05/2024   MCV 86 09/05/2024   PLT 268 09/05/2024    Lab Results  Component Value Date   CREATININE 0.87 09/05/2024    Lab Results  Component Value Date   PSA 2.45 09/08/2022   PSA 2.68 08/08/2021   PSA 2.0 04/23/2020    Lab Results  Component Value Date   TESTOSTERONE  276 (L)  10/21/2015    Lab Results  Component Value Date   HGBA1C 6.2 (H) 09/05/2024    Urinalysis   Pertinent Imaging: ***  Assessment:    Plan:    There are no diagnoses linked to this encounter.  No follow-ups on file.  Garnette CHRISTELLA Shack, MD  [facility]      [1] No Known Allergies  "

## 2024-12-05 ENCOUNTER — Ambulatory Visit: Admitting: Urology

## 2024-12-05 DIAGNOSIS — R972 Elevated prostate specific antigen [PSA]: Secondary | ICD-10-CM

## 2025-02-21 ENCOUNTER — Ambulatory Visit: Admitting: Urology

## 2025-03-13 ENCOUNTER — Ambulatory Visit (HOSPITAL_BASED_OUTPATIENT_CLINIC_OR_DEPARTMENT_OTHER): Admitting: Family Medicine
